# Patient Record
Sex: Female | Born: 1950
Health system: Southern US, Community
[De-identification: ages and names within clinical notes are randomized; demographics above are authoritative.]

## PROBLEM LIST (undated history)

## (undated) ENCOUNTER — Ambulatory Visit (HOSPITAL_COMMUNITY): Admission: EM | Payer: Medicare HMO

## (undated) DIAGNOSIS — I10 Essential (primary) hypertension: Secondary | ICD-10-CM

## (undated) DIAGNOSIS — F32A Depression, unspecified: Secondary | ICD-10-CM

## (undated) DIAGNOSIS — R569 Unspecified convulsions: Secondary | ICD-10-CM

## (undated) DIAGNOSIS — M797 Fibromyalgia: Secondary | ICD-10-CM

## (undated) DIAGNOSIS — E785 Hyperlipidemia, unspecified: Secondary | ICD-10-CM

## (undated) DIAGNOSIS — F419 Anxiety disorder, unspecified: Secondary | ICD-10-CM

## (undated) DIAGNOSIS — K219 Gastro-esophageal reflux disease without esophagitis: Secondary | ICD-10-CM

## (undated) DIAGNOSIS — F329 Major depressive disorder, single episode, unspecified: Secondary | ICD-10-CM

## (undated) DIAGNOSIS — H269 Unspecified cataract: Secondary | ICD-10-CM

## (undated) DIAGNOSIS — G4700 Insomnia, unspecified: Secondary | ICD-10-CM

## (undated) DIAGNOSIS — I729 Aneurysm of unspecified site: Secondary | ICD-10-CM

## (undated) DIAGNOSIS — M1990 Unspecified osteoarthritis, unspecified site: Secondary | ICD-10-CM

## (undated) HISTORY — DX: Depression, unspecified: F32.A

## (undated) HISTORY — DX: Unspecified cataract: H26.9

## (undated) HISTORY — DX: Anxiety disorder, unspecified: F41.9

## (undated) HISTORY — PX: POLYPECTOMY: SHX149

## (undated) HISTORY — PX: COLONOSCOPY: SHX174

## (undated) HISTORY — DX: Hyperlipidemia, unspecified: E78.5

## (undated) HISTORY — DX: Unspecified convulsions: R56.9

## (undated) HISTORY — DX: Gastro-esophageal reflux disease without esophagitis: K21.9

## (undated) HISTORY — PX: BREAST CYST EXCISION: SHX579

## (undated) HISTORY — DX: Aneurysm of unspecified site: I72.9

## (undated) HISTORY — DX: Unspecified osteoarthritis, unspecified site: M19.90

## (undated) HISTORY — PX: CEREBRAL ANEURYSM REPAIR: SHX164

## (undated) HISTORY — PX: ABDOMINAL HYSTERECTOMY: SHX81

## (undated) HISTORY — DX: Major depressive disorder, single episode, unspecified: F32.9

---

## 2009-12-01 ENCOUNTER — Emergency Department (HOSPITAL_COMMUNITY): Admission: EM | Admit: 2009-12-01 | Discharge: 2009-12-01 | Payer: Self-pay | Admitting: Emergency Medicine

## 2010-01-19 ENCOUNTER — Emergency Department (HOSPITAL_COMMUNITY)
Admission: EM | Admit: 2010-01-19 | Discharge: 2010-01-19 | Payer: Self-pay | Source: Home / Self Care | Admitting: Emergency Medicine

## 2010-03-02 ENCOUNTER — Emergency Department (HOSPITAL_COMMUNITY)
Admission: EM | Admit: 2010-03-02 | Discharge: 2010-03-02 | Disposition: A | Discharge status: Home / Self Care | Payer: Self-pay | Attending: Emergency Medicine | Admitting: Emergency Medicine

## 2010-03-02 DIAGNOSIS — B3731 Acute candidiasis of vulva and vagina: Secondary | ICD-10-CM | POA: Insufficient documentation

## 2010-03-02 DIAGNOSIS — I10 Essential (primary) hypertension: Secondary | ICD-10-CM | POA: Insufficient documentation

## 2010-03-02 DIAGNOSIS — R569 Unspecified convulsions: Secondary | ICD-10-CM | POA: Insufficient documentation

## 2010-03-02 DIAGNOSIS — B373 Candidiasis of vulva and vagina: Secondary | ICD-10-CM | POA: Insufficient documentation

## 2010-03-02 DIAGNOSIS — R300 Dysuria: Secondary | ICD-10-CM | POA: Insufficient documentation

## 2010-03-02 DIAGNOSIS — N390 Urinary tract infection, site not specified: Secondary | ICD-10-CM | POA: Insufficient documentation

## 2010-03-02 LAB — URINE MICROSCOPIC-ADD ON

## 2010-03-02 LAB — URINALYSIS, ROUTINE W REFLEX MICROSCOPIC
Specific Gravity, Urine: 1.008 (ref 1.005–1.030)
Urine Glucose, Fasting: NEGATIVE mg/dL
Urobilinogen, UA: 0.2 mg/dL (ref 0.0–1.0)

## 2010-03-04 LAB — URINE CULTURE

## 2010-03-29 ENCOUNTER — Emergency Department (HOSPITAL_COMMUNITY)
Admission: EM | Admit: 2010-03-29 | Discharge: 2010-03-29 | Disposition: A | Discharge status: Home / Self Care | Payer: Self-pay | Attending: Emergency Medicine | Admitting: Emergency Medicine

## 2010-03-29 DIAGNOSIS — R300 Dysuria: Secondary | ICD-10-CM | POA: Insufficient documentation

## 2010-03-29 DIAGNOSIS — B3731 Acute candidiasis of vulva and vagina: Secondary | ICD-10-CM | POA: Insufficient documentation

## 2010-03-29 DIAGNOSIS — G40909 Epilepsy, unspecified, not intractable, without status epilepticus: Secondary | ICD-10-CM | POA: Insufficient documentation

## 2010-03-29 DIAGNOSIS — L293 Anogenital pruritus, unspecified: Secondary | ICD-10-CM | POA: Insufficient documentation

## 2010-03-29 DIAGNOSIS — N390 Urinary tract infection, site not specified: Secondary | ICD-10-CM | POA: Insufficient documentation

## 2010-03-29 DIAGNOSIS — R350 Frequency of micturition: Secondary | ICD-10-CM | POA: Insufficient documentation

## 2010-03-29 DIAGNOSIS — Z79899 Other long term (current) drug therapy: Secondary | ICD-10-CM | POA: Insufficient documentation

## 2010-03-29 DIAGNOSIS — R21 Rash and other nonspecific skin eruption: Secondary | ICD-10-CM | POA: Insufficient documentation

## 2010-03-29 DIAGNOSIS — B373 Candidiasis of vulva and vagina: Secondary | ICD-10-CM | POA: Insufficient documentation

## 2010-03-29 DIAGNOSIS — I10 Essential (primary) hypertension: Secondary | ICD-10-CM | POA: Insufficient documentation

## 2010-03-29 LAB — URINALYSIS, ROUTINE W REFLEX MICROSCOPIC
Bilirubin Urine: NEGATIVE
Glucose, UA: NEGATIVE mg/dL
Ketones, ur: NEGATIVE mg/dL
Nitrite: POSITIVE — AB
Protein, ur: NEGATIVE mg/dL
Specific Gravity, Urine: 1.015 (ref 1.005–1.030)
Urobilinogen, UA: 0.2 mg/dL (ref 0.0–1.0)
pH: 5.5 (ref 5.0–8.0)

## 2010-03-29 LAB — WET PREP, GENITAL
Clue Cells Wet Prep HPF POC: NONE SEEN
Yeast Wet Prep HPF POC: NONE SEEN

## 2010-03-29 LAB — URINE MICROSCOPIC-ADD ON

## 2010-04-04 LAB — URINALYSIS, ROUTINE W REFLEX MICROSCOPIC
Bilirubin Urine: NEGATIVE
Glucose, UA: NEGATIVE mg/dL
Ketones, ur: NEGATIVE mg/dL
Nitrite: NEGATIVE
Specific Gravity, Urine: 1.009 (ref 1.005–1.030)
Urobilinogen, UA: 0.2 mg/dL (ref 0.0–1.0)
pH: 5 (ref 5.0–8.0)

## 2010-04-04 LAB — POCT I-STAT, CHEM 8
Calcium, Ion: 1.17 mmol/L (ref 1.12–1.32)
Glucose, Bld: 88 mg/dL (ref 70–99)
HCT: 45 % (ref 36.0–46.0)
Hemoglobin: 15.3 g/dL — ABNORMAL HIGH (ref 12.0–15.0)
Sodium: 134 mEq/L — ABNORMAL LOW (ref 135–145)
TCO2: 27 mmol/L (ref 0–100)

## 2010-04-04 LAB — URINE CULTURE

## 2010-04-05 LAB — URINALYSIS, ROUTINE W REFLEX MICROSCOPIC
Bilirubin Urine: NEGATIVE
Glucose, UA: NEGATIVE mg/dL
Ketones, ur: NEGATIVE mg/dL
Nitrite: POSITIVE — AB
Protein, ur: NEGATIVE mg/dL
Specific Gravity, Urine: 1.007 (ref 1.005–1.030)
Urobilinogen, UA: 0.2 mg/dL (ref 0.0–1.0)
pH: 6 (ref 5.0–8.0)

## 2010-04-05 LAB — URINE CULTURE: Culture  Setup Time: 201111091628

## 2010-04-05 LAB — URINE MICROSCOPIC-ADD ON

## 2010-04-21 ENCOUNTER — Emergency Department (HOSPITAL_COMMUNITY)
Admission: EM | Admit: 2010-04-21 | Discharge: 2010-04-21 | Disposition: A | Discharge status: Home / Self Care | Payer: Self-pay | Attending: Emergency Medicine | Admitting: Emergency Medicine

## 2010-04-21 DIAGNOSIS — N899 Noninflammatory disorder of vagina, unspecified: Secondary | ICD-10-CM | POA: Insufficient documentation

## 2010-04-21 DIAGNOSIS — I10 Essential (primary) hypertension: Secondary | ICD-10-CM | POA: Insufficient documentation

## 2010-04-21 DIAGNOSIS — N760 Acute vaginitis: Secondary | ICD-10-CM | POA: Insufficient documentation

## 2010-04-21 DIAGNOSIS — Z79899 Other long term (current) drug therapy: Secondary | ICD-10-CM | POA: Insufficient documentation

## 2010-04-21 DIAGNOSIS — G40909 Epilepsy, unspecified, not intractable, without status epilepticus: Secondary | ICD-10-CM | POA: Insufficient documentation

## 2010-04-21 DIAGNOSIS — R300 Dysuria: Secondary | ICD-10-CM | POA: Insufficient documentation

## 2010-04-21 LAB — URINALYSIS, ROUTINE W REFLEX MICROSCOPIC
Bilirubin Urine: NEGATIVE
Glucose, UA: NEGATIVE mg/dL
Hgb urine dipstick: NEGATIVE
Protein, ur: NEGATIVE mg/dL
Urobilinogen, UA: 0.2 mg/dL (ref 0.0–1.0)
pH: 6.5 (ref 5.0–8.0)

## 2010-06-02 ENCOUNTER — Encounter: Payer: Self-pay | Admitting: Family Medicine

## 2010-06-02 ENCOUNTER — Ambulatory Visit (INDEPENDENT_AMBULATORY_CARE_PROVIDER_SITE_OTHER): Payer: Self-pay | Admitting: Family Medicine

## 2010-06-02 DIAGNOSIS — M1990 Unspecified osteoarthritis, unspecified site: Secondary | ICD-10-CM

## 2010-06-02 DIAGNOSIS — R202 Paresthesia of skin: Secondary | ICD-10-CM

## 2010-06-02 DIAGNOSIS — N390 Urinary tract infection, site not specified: Secondary | ICD-10-CM

## 2010-06-02 DIAGNOSIS — R569 Unspecified convulsions: Secondary | ICD-10-CM

## 2010-06-02 DIAGNOSIS — F329 Major depressive disorder, single episode, unspecified: Secondary | ICD-10-CM

## 2010-06-02 DIAGNOSIS — G4700 Insomnia, unspecified: Secondary | ICD-10-CM

## 2010-06-02 DIAGNOSIS — R209 Unspecified disturbances of skin sensation: Secondary | ICD-10-CM

## 2010-06-02 DIAGNOSIS — G5620 Lesion of ulnar nerve, unspecified upper limb: Secondary | ICD-10-CM

## 2010-06-02 DIAGNOSIS — M129 Arthropathy, unspecified: Secondary | ICD-10-CM

## 2010-06-02 MED ORDER — TRAMADOL HCL 50 MG PO TABS: 50.0000 mg | ORAL_TABLET | Freq: Four times a day (QID) | ORAL | Status: DC | PRN

## 2010-06-02 MED ORDER — CITALOPRAM HYDROBROMIDE 40 MG PO TABS: 40.0000 mg | ORAL_TABLET | Freq: Every day | ORAL | Status: DC

## 2010-06-02 MED ORDER — LEVETIRACETAM 500 MG PO TABS: 500.0000 mg | ORAL_TABLET | Freq: Two times a day (BID) | ORAL | Status: DC

## 2010-06-02 MED ORDER — TEMAZEPAM 22.5 MG PO CAPS: 22.5000 mg | ORAL_CAPSULE | Freq: Every evening | ORAL | Status: DC | PRN

## 2010-06-02 MED ORDER — QUINAPRIL HCL 20 MG PO TABS: 20.0000 mg | ORAL_TABLET | Freq: Every day | ORAL | Status: DC

## 2010-06-02 MED ORDER — ESTROGENS CONJUGATED 0.625 MG PO TABS: 0.6250 mg | ORAL_TABLET | Freq: Every day | ORAL | Status: DC

## 2010-06-02 MED ORDER — GABAPENTIN 100 MG PO TABS: 100.0000 mg | ORAL_TABLET | Freq: Three times a day (TID) | ORAL | Status: DC

## 2010-06-02 NOTE — Patient Instructions (Signed)
Please stop taking indomethacin Please make an appt for lab work in the AM Come without eating or drinking except for black coffee or water Please come back to see me in 2-3 weeks to discuss labs and do a physical.  Please read the seratonin syndrome handout for tramadol and celexa  Call Dr. Pascal Lux to make an appt for [phone] for the mood disorder clinic.  I will let her know to expect your call.

## 2010-06-03 ENCOUNTER — Encounter: Payer: Self-pay | Admitting: Family Medicine

## 2010-06-03 DIAGNOSIS — G5620 Lesion of ulnar nerve, unspecified upper limb: Secondary | ICD-10-CM | POA: Insufficient documentation

## 2010-06-03 DIAGNOSIS — F329 Major depressive disorder, single episode, unspecified: Secondary | ICD-10-CM | POA: Insufficient documentation

## 2010-06-03 DIAGNOSIS — R202 Paresthesia of skin: Secondary | ICD-10-CM | POA: Insufficient documentation

## 2010-06-03 DIAGNOSIS — R569 Unspecified convulsions: Secondary | ICD-10-CM | POA: Insufficient documentation

## 2010-06-03 DIAGNOSIS — G4700 Insomnia, unspecified: Secondary | ICD-10-CM | POA: Insufficient documentation

## 2010-06-03 DIAGNOSIS — N390 Urinary tract infection, site not specified: Secondary | ICD-10-CM | POA: Insufficient documentation

## 2010-06-03 DIAGNOSIS — M1990 Unspecified osteoarthritis, unspecified site: Secondary | ICD-10-CM | POA: Insufficient documentation

## 2010-06-03 NOTE — Assessment & Plan Note (Signed)
Pinkie and 2nd finger numbness.  No weakness.  Conservative management.  Advised brace but on further reading brace no better than placebo.  If brace not helping, will advise to stop.

## 2010-06-03 NOTE — Assessment & Plan Note (Signed)
On temazapam for this x 2 years.  Will continue for now.  Will try to wean pt from this medication

## 2010-06-03 NOTE — Progress Notes (Signed)
  Subjective:    Patient ID: [name], female    DOB: May 21, 1950, 60 y.o.   MRN: [id number]  HPI  Depression- x several years.  Has been on several meds.  Also dealing with grief issues.  Open to trying new medication and or therapy.  No SI/HI.    Seizures- has had 6 seizures over the last 6 years.  States that these started when her anerysm was clipped.  Has been on keppra x 2 years. Saw a neurologist at time of seizures but not since  Numbness in fingers- 4th and 5th fingers with numbness x 2 weeks.  Feels that her fingers are occasionally weak.  Denies tingling or shooting pains.    Arthritis- pain in joints, especially in spine.  Has been trying indomethacin with limited relief. Has not tried tylenol consistently, just intermittently but doesn't think it helps.  Thinks tramadol may have helped in the past   Requests to defer CPE until next visit. Review of Systems    deneis CP, SOB, seizures Objective:   Physical Exam    Vital signs reviewed General appearance - alert, well appearing, and in no distress and oriented to person, place, and time Heart - normal rate, regular rhythm, normal S1, S2, no murmurs, rubs, clicks or gallops Chest - clear to auscultation, no wheezes, rales or rhonchi, symmetric air entry, no tachypnea, retractions or cyanosis Neurological - alert, oriented, normal speech, no focal findings or movement disorder noted, screening mental status exam normal, neck supple without rigidity, cranial nerves II through XII intact.  Strength normal in hands, upper and lower ext.  Numbness brought on with phalen's, negative tinnel's at wrist.  No findings at elbow.       Assessment & Plan:

## 2010-06-03 NOTE — Assessment & Plan Note (Signed)
Will stop indomethacin due to HTN.  Will start tramadol for pain.

## 2010-06-03 NOTE — Assessment & Plan Note (Signed)
Will advise to call Dr. Pascal Lux to see if [facility]DC would be appropriate.  celexa is not helping, per pt.  However denies SI/HI.  Also pt states that she has lots of situational stress (son with esophageal CA and brother died in 2023/01/09) and does nto have a therapist. Would be interested in couseling.

## 2010-06-03 NOTE — Assessment & Plan Note (Signed)
No symptoms now.  Normal urology eval per pt.

## 2010-06-07 ENCOUNTER — Other Ambulatory Visit: Payer: Self-pay

## 2010-06-07 DIAGNOSIS — D509 Iron deficiency anemia, unspecified: Secondary | ICD-10-CM

## 2010-06-07 DIAGNOSIS — I10 Essential (primary) hypertension: Secondary | ICD-10-CM

## 2010-06-07 LAB — COMPREHENSIVE METABOLIC PANEL
ALT: 15 U/L (ref 0–35)
Albumin: 4 g/dL (ref 3.5–5.2)
Alkaline Phosphatase: 65 U/L (ref 39–117)
Calcium: 8.9 mg/dL (ref 8.4–10.5)
Creat: 0.77 mg/dL (ref 0.40–1.20)
Glucose, Bld: 78 mg/dL (ref 70–99)
Potassium: 4.5 mEq/L (ref 3.5–5.3)
Sodium: 134 mEq/L — ABNORMAL LOW (ref 135–145)
Total Bilirubin: 0.3 mg/dL (ref 0.3–1.2)

## 2010-06-07 LAB — LIPID PANEL
Cholesterol: 217 mg/dL — ABNORMAL HIGH (ref 0–200)
HDL: 59 mg/dL (ref >39)
Triglycerides: 305 mg/dL — ABNORMAL HIGH (ref <150)
VLDL: 61 mg/dL — ABNORMAL HIGH (ref 0–40)

## 2010-06-07 LAB — CBC
HCT: 40.1 % (ref 36.0–46.0)
Hemoglobin: 13.4 g/dL (ref 12.0–15.0)
MCV: 93.5 fL (ref 78.0–100.0)
RDW: 13.8 % (ref 11.5–15.5)

## 2010-06-07 NOTE — Progress Notes (Signed)
Cmp,cbc,tsh and flp done today [facility] Kemani Heidel

## 2010-06-09 ENCOUNTER — Encounter: Payer: Self-pay | Admitting: Family Medicine

## 2010-06-13 ENCOUNTER — Encounter: Payer: Self-pay | Admitting: Family Medicine

## 2010-06-18 ENCOUNTER — Emergency Department (HOSPITAL_COMMUNITY)
Admission: EM | Admit: 2010-06-18 | Discharge: 2010-06-18 | Disposition: A | Discharge status: Home / Self Care | Payer: Self-pay | Attending: Emergency Medicine | Admitting: Emergency Medicine

## 2010-06-18 DIAGNOSIS — F411 Generalized anxiety disorder: Secondary | ICD-10-CM | POA: Insufficient documentation

## 2010-06-18 DIAGNOSIS — G40909 Epilepsy, unspecified, not intractable, without status epilepticus: Secondary | ICD-10-CM | POA: Insufficient documentation

## 2010-06-18 DIAGNOSIS — Z79899 Other long term (current) drug therapy: Secondary | ICD-10-CM | POA: Insufficient documentation

## 2010-06-18 DIAGNOSIS — Z760 Encounter for issue of repeat prescription: Secondary | ICD-10-CM | POA: Insufficient documentation

## 2010-06-18 DIAGNOSIS — I10 Essential (primary) hypertension: Secondary | ICD-10-CM | POA: Insufficient documentation

## 2010-06-22 ENCOUNTER — Ambulatory Visit: Payer: Self-pay | Admitting: Family Medicine

## 2010-07-01 DIAGNOSIS — I671 Cerebral aneurysm, nonruptured: Secondary | ICD-10-CM | POA: Insufficient documentation

## 2010-08-08 ENCOUNTER — Other Ambulatory Visit: Payer: Self-pay | Admitting: Family Medicine

## 2010-08-09 NOTE — Telephone Encounter (Signed)
Refill request

## 2010-09-01 DIAGNOSIS — F419 Anxiety disorder, unspecified: Secondary | ICD-10-CM | POA: Insufficient documentation

## 2010-09-14 ENCOUNTER — Emergency Department (HOSPITAL_COMMUNITY)
Admission: EM | Admit: 2010-09-14 | Discharge: 2010-09-14 | Disposition: A | Discharge status: Home / Self Care | Payer: Self-pay | Attending: Emergency Medicine | Admitting: Emergency Medicine

## 2010-09-14 DIAGNOSIS — H53149 Visual discomfort, unspecified: Secondary | ICD-10-CM | POA: Insufficient documentation

## 2010-09-14 DIAGNOSIS — I10 Essential (primary) hypertension: Secondary | ICD-10-CM | POA: Insufficient documentation

## 2010-09-14 DIAGNOSIS — Z79899 Other long term (current) drug therapy: Secondary | ICD-10-CM | POA: Insufficient documentation

## 2010-09-14 DIAGNOSIS — G40909 Epilepsy, unspecified, not intractable, without status epilepticus: Secondary | ICD-10-CM | POA: Insufficient documentation

## 2010-09-14 DIAGNOSIS — G43909 Migraine, unspecified, not intractable, without status migrainosus: Secondary | ICD-10-CM | POA: Insufficient documentation

## 2010-09-14 DIAGNOSIS — R110 Nausea: Secondary | ICD-10-CM | POA: Insufficient documentation

## 2010-10-05 ENCOUNTER — Other Ambulatory Visit: Payer: Self-pay | Admitting: Family Medicine

## 2010-10-05 NOTE — Telephone Encounter (Signed)
Refill request

## 2010-10-12 ENCOUNTER — Inpatient Hospital Stay (HOSPITAL_COMMUNITY)
Admission: RE | Admit: 2010-10-12 | Discharge: 2010-11-02 | DRG: 885 | Disposition: A | Discharge status: Home / Self Care | Payer: PRIVATE HEALTH INSURANCE | Attending: Psychiatry | Admitting: Psychiatry

## 2010-10-12 DIAGNOSIS — Z79899 Other long term (current) drug therapy: Secondary | ICD-10-CM

## 2010-10-12 DIAGNOSIS — M129 Arthropathy, unspecified: Secondary | ICD-10-CM

## 2010-10-12 DIAGNOSIS — R45851 Suicidal ideations: Secondary | ICD-10-CM

## 2010-10-12 DIAGNOSIS — IMO0001 Reserved for inherently not codable concepts without codable children: Secondary | ICD-10-CM

## 2010-10-12 DIAGNOSIS — F332 Major depressive disorder, recurrent severe without psychotic features: Principal | ICD-10-CM

## 2010-10-12 DIAGNOSIS — N390 Urinary tract infection, site not specified: Secondary | ICD-10-CM

## 2010-10-12 DIAGNOSIS — G43909 Migraine, unspecified, not intractable, without status migrainosus: Secondary | ICD-10-CM

## 2010-10-12 DIAGNOSIS — G40909 Epilepsy, unspecified, not intractable, without status epilepticus: Secondary | ICD-10-CM

## 2010-10-12 DIAGNOSIS — F411 Generalized anxiety disorder: Secondary | ICD-10-CM

## 2010-10-12 DIAGNOSIS — I10 Essential (primary) hypertension: Secondary | ICD-10-CM

## 2010-10-13 DIAGNOSIS — F339 Major depressive disorder, recurrent, unspecified: Secondary | ICD-10-CM

## 2010-10-13 DIAGNOSIS — F411 Generalized anxiety disorder: Secondary | ICD-10-CM

## 2010-10-13 LAB — HEPATIC FUNCTION PANEL
ALT: 23 U/L (ref 0–35)
AST: 23 U/L (ref 0–37)
Bilirubin, Direct: 0.1 mg/dL (ref 0.0–0.3)
Indirect Bilirubin: 0.2 mg/dL — ABNORMAL LOW (ref 0.3–0.9)
Total Bilirubin: 0.3 mg/dL (ref 0.3–1.2)

## 2010-10-13 LAB — ETHANOL: Alcohol, Ethyl (B): <11 mg/dL (ref 0–11)

## 2010-10-13 LAB — CBC
HCT: 41.6 % (ref 36.0–46.0)
Hemoglobin: 14.2 g/dL (ref 12.0–15.0)
RBC: 4.5 MIL/uL (ref 3.87–5.11)
WBC: 11.1 10*3/uL — ABNORMAL HIGH (ref 4.0–10.5)

## 2010-10-13 LAB — TSH: TSH: 1.552 u[IU]/mL (ref 0.350–4.500)

## 2010-10-13 LAB — HEMOGLOBIN A1C
Hgb A1c MFr Bld: 5.4 % (ref <5.7)
Mean Plasma Glucose: 108 mg/dL (ref <117)

## 2010-10-13 LAB — COMPREHENSIVE METABOLIC PANEL
Albumin: 3.7 g/dL (ref 3.5–5.2)
BUN: 20 mg/dL (ref 6–23)
Creatinine, Ser: 0.79 mg/dL (ref 0.50–1.10)
Total Protein: 7.1 g/dL (ref 6.0–8.3)

## 2010-10-14 LAB — TSH: TSH: 0.997 u[IU]/mL (ref 0.350–4.500)

## 2010-10-14 LAB — T3, FREE: T3, Free: 2.5 pg/mL (ref 2.3–4.2)

## 2010-10-17 LAB — URINE MICROSCOPIC-ADD ON

## 2010-10-17 LAB — URINALYSIS, ROUTINE W REFLEX MICROSCOPIC
Bilirubin Urine: NEGATIVE
Glucose, UA: NEGATIVE mg/dL
Ketones, ur: NEGATIVE mg/dL
Protein, ur: NEGATIVE mg/dL

## 2010-10-18 LAB — URINE DRUGS OF ABUSE SCREEN W ALC, ROUTINE (REF LAB)
Amphetamine Screen, Ur: NEGATIVE
Barbiturate Quant, Ur: NEGATIVE
Cocaine Metabolites: NEGATIVE
Creatinine,U: 28.3 mg/dL
Marijuana Metabolite: NEGATIVE
Methadone: NEGATIVE

## 2010-10-24 DIAGNOSIS — F411 Generalized anxiety disorder: Secondary | ICD-10-CM

## 2010-10-24 DIAGNOSIS — F339 Major depressive disorder, recurrent, unspecified: Secondary | ICD-10-CM

## 2010-10-24 NOTE — Assessment & Plan Note (Signed)
[name], [name] NO.:  [id number]  MEDICAL RECORD NO.:  [id number]  LOCATION:  0502                          FACILITY:  [facility]  PHYSICIAN:  Franchot Gallo, MD     DATE OF BIRTH:  [birth year]  DATE OF ADMISSION:  10/12/2010 DATE OF DISCHARGE:                      PSYCHIATRIC ADMISSION ASSESSMENT   This is a 60 year old female voluntarily admitted on October 12, 2010.  HISTORY OF PRESENT ILLNESS:  The patient presents with a history of depression, having a lot of grief issues.  She had lost her only son in May from esophageal cancer.  She has been isolating, having difficulty sleeping.  She is constantly thinking about him.  She denies any substance use or psychotic symptoms.  PAST PSYCHIATRIC HISTORY:  First admission to [facility]. She sees Dr. Lafayette Williamson for outpatient mental health services.  The patient reports a long history of depression.  SOCIAL HISTORY:  The patient is single.  She resides in [place].  She lives with a friend and is divorced.  FAMILY HISTORY:  None.  ALCOHOL AND DRUG HISTORY:  She denies any alcohol or substance use.  PRIMARY CARE PROVIDER:  Dr. Maryelizabeth Williamson.  MEDICAL PROBLEMS: 1. History of a seizure disorder. 2. Hypertension. 3. Fibromyalgia.  PAST SURGICAL HISTORY: 1. Hysterectomy. 2. Cerebral aneurysm in 1997.  MEDICATIONS: 1. Keppra 500 mg b.i.d. 2. Metoprolol 25 mg daily. 3. Quinapril 20 mg daily. 4. Premarin 6.25 mg daily. 5. Prozac 20 mg daily. 6. Temazepam 30 mg at bedtime.  DRUG ALLERGIES:  No known allergies, but the patient gets headaches from Wellbutrin.  REVIEW OF SYSTEMS:  Is positive for insomnia.  No weight loss.  No chest pain.  No chest pressure.  No shortness of breath.  No nausea.  No vomiting.  No dysuria, constipation.  Positive for muscle and joint pain.  Positive for seizures.  Positive for depression.  Positive for anxiety.  PHYSICAL EXAMINATION:  VITAL SIGNS:   Temperature of 97, heart rate 88, respirations 16, blood pressure is 128/82. GENERAL APPEARANCE:  This is a well-nourished female in no acute distress. HEAD, NOSE AND THROAT:  Atraumatic and normocephalic. NECK:  Supple. CHEST:  Clear.  No rales.  No wheezes. BREAST EXAM:  Deferred. HEART:  Regular rate and rhythm.  No murmurs or gallops. ABDOMEN:  Soft, nondistended abdomen.  Positive bowel sounds. PELVIC AND GU EXAM:  Deferred. EXTREMITIES:  Moves all extremities.  No clubbing.  No deformities. SKIN:  Warm and dry with no obvious rashes or lacerations noted. NEUROLOGICAL FINDINGS:  Intact.  Nonfocal.  No tics.  No tremors.  LABORATORY DATA:  A CBC with a white count of 11.1.  CMP within normal limits.  Alcohol level less than 11.  MENTAL STATUS EXAM:  The patient is fully alert and cooperative, very calm and neat in appearance.  She has good eye contact.  She provides a good history.  Her mood is depressed.  She appears sad.  Thought processes are coherent and goal-directed.  No evidence of any psychotic symptoms and does not appear to be responding to any internal stimuli. Cognitive function intact.  Her memory is good.  Her judgment and insight appear  to be good.  Axis I:  Major depressive disorder, severe, recurrent; bereavement. Axis II:  Deferred. Axis III:  History of hypertension, seizure disorder and a history of cerebral aneurysm. Axis IV:  Other psychosocial problems related to grief. Axis V:  Current is 35-40.  PLAN:  Discontinue her Prozac.  We will initiate Cymbalta to help with her pain and depression.  We will obtain further labs.  We will have Neurontin as well for pain.  Her tentative length of stay at this time is 3-5 days.     [name], N.P.   ______________________________ Franchot Gallo, MD    JO/MEDQ  D:  10/13/2010  T:  10/13/2010  Job:  [id number]  Electronically Signed by Limmie PatriciaP. on 10/24/2010 09:54:42 AM Electronically Signed  by Franchot Gallo MD on 10/24/2010 12:21:34 PM

## 2010-11-04 NOTE — Discharge Summary (Signed)
[name], [name]                 ACCOUNT NO.:  [id number]  MEDICAL RECORD NO.:  [id number]  LOCATION:  0502                          [facility]ILITY[facility]  [facility]  PHYSICIAN:  Franchot Gallo, MD     DATE OF BIRTH:  [birth year]  DATE OF ADMISSION:  10/12/2010 DATE OF DISCHARGE:  11/02/2010                              DISCHARGE SUMMARY   REASON FOR ADMISSION:  This is a 60 year old female who has a long history of depression and had a lot of grief issues, lost her only son in May from a history of esophageal cancer.  She has been isolating, having trouble sleeping and constantly thinking about him.  FINAL IMPRESSION:  AXIS I:  Major depressive disorder, recurrent, severe.  Generalized anxiety disorder. AXIS II:  Deferred. AXIS III:  Seizure disorder, hypertension and fibromyalgia. AXIS IV:  Limited primary support system, chronic mental illness. AXIS V:  Global assessment of functioning at discharge is 65.  LABS:  CBC:  White count 11.1.  CMP within normal limits.  Alcohol level less than 11.  SIGNIFICANT FINDINGS:  The patient was admitted to the adult milieu for safety and stabilization.  We discontinued her Prozac at this time and started Cymbalta to help with her pain and depression.  We also ordered Neurontin for pain.  The patient was having ongoing problems with suicidal thinking throughout her stay.  Unable to still process her grief issues.  She was very active, however and attending groups, very organized, clear thinking.  [name] refused any contact with her family or significant others reporting that the patient's friend was her only support, but did not want her friend involved in her treatment.  She was having problems with trichotillomania and was started on medication.  She remained very depressed.  She had no plan to harm herself.  We increased her Cymbalta and her Neurontin to help with her anxiety and depression, and continued to monitor her behavior.  She rated her  hopelessness a 5 on a scale of 1-10.  She continued to report problems with sleep, but denied any suicidal or homicidal thoughts.  We added Klonopin for anxiety and started her on Macrodantin for history of urinary tract infection.  We also added Abilify at this time to augment her antidepressant.  We continued to titrate her Abilify and increased her Klonopin for anxiety.  We discontinued her Risperdal due to sedation and added Zoloft to augment her antidepressant.  We started the patient on Orap for trichotillomania and discontinued her Abilify.  There are times when the patient's depression would worsen and her hopelessness had increased rating it a 9 on a scale of 1-10.  She was still pulling her hair when she would feel especially depressed when groups had talked about grief and discussed her children.  She was having thoughts at times that she would never get well.  We increased her Orap and increased her Zoloft to further lessen her depressive symptoms.  We also ordered an EKG to monitor her QT interval.  She was beginning to improve, up and dressed, and active in the milieu, interacting with other patients.  She did report at one time on October 29, 2010, that she did not think she needed to be here and felt she was getting ready to go home.  She was getting some benefit with the Orap with her hair pulling.  On the day of discharge, the patient was seen in the Interdisciplinary Treatment Team.  She was fully alert, cooperative, felt she was ready to go home, more bright.  She had a plan and numbers to call in case of crisis.  She states that she would touch base with her friend for any issues.  Her sleep was fair to good.  Appetite was good.  Her mild depressive symptoms, rating it a 4-5 on a scale of 1-10. Adamantly denied any suicidal or homicidal thoughts, or psychotic symptoms.  DISCHARGE MEDICATIONS: 1. Klonopin 1 mg at 8 and 2. 2. Premarin 6.25 mg daily. 3. Colace 100 mg  daily. 4. Cymbalta 60 mg one at 8 and 2. 5. Lunesta 3 mg at 8:30. 6. Gabapentin 800 mg at 8, 2 and 8:30. 7. Orap 2 mg taking 1-1/2 daily and 1 tablet at 8:30 p.m. 8. Zoloft 100 mg daily. 9. Keppra 500 mg one b.i.d. 10.Metoprolol 25 mg daily. 11.Quinapril 20 mg daily. 12.The patient was to stop taking her Restoril.  FOLLOW UP APPOINTMENT: 1. [facility] with the walk-in clinic, phone number [phone].  She also     received information on resources. 2. She would also have a [facility] Community Support Team at phone number     [phone] and received information on hospice, phone number [phone].     Landry Corporal, N.P.   ______________________________ Franchot Gallo, MD    JO/MEDQ  D:  11/03/2010  T:  11/04/2010  Job:  [id number]  Electronically Signed by Limmie PatriciaP. on 11/04/2010 09:39:08 AM Electronically Signed by Franchot Gallo MD on 11/04/2010 04:59:31 PM

## 2010-11-24 ENCOUNTER — Emergency Department (HOSPITAL_COMMUNITY): Payer: Self-pay

## 2010-11-24 ENCOUNTER — Observation Stay (HOSPITAL_COMMUNITY)
Admission: EM | Admit: 2010-11-24 | Discharge: 2010-11-28 | Disposition: A | Discharge status: Home / Self Care | Payer: Self-pay | Attending: Family Medicine | Admitting: Family Medicine

## 2010-11-24 DIAGNOSIS — Z23 Encounter for immunization: Secondary | ICD-10-CM | POA: Insufficient documentation

## 2010-11-24 DIAGNOSIS — R269 Unspecified abnormalities of gait and mobility: Principal | ICD-10-CM | POA: Insufficient documentation

## 2010-11-24 DIAGNOSIS — F329 Major depressive disorder, single episode, unspecified: Secondary | ICD-10-CM | POA: Insufficient documentation

## 2010-11-24 DIAGNOSIS — Z79899 Other long term (current) drug therapy: Secondary | ICD-10-CM | POA: Insufficient documentation

## 2010-11-24 DIAGNOSIS — R7402 Elevation of levels of lactic acid dehydrogenase (LDH): Secondary | ICD-10-CM | POA: Insufficient documentation

## 2010-11-24 DIAGNOSIS — E871 Hypo-osmolality and hyponatremia: Secondary | ICD-10-CM | POA: Insufficient documentation

## 2010-11-24 DIAGNOSIS — I10 Essential (primary) hypertension: Secondary | ICD-10-CM | POA: Insufficient documentation

## 2010-11-24 DIAGNOSIS — Z9181 History of falling: Secondary | ICD-10-CM | POA: Insufficient documentation

## 2010-11-24 DIAGNOSIS — R7401 Elevation of levels of liver transaminase levels: Secondary | ICD-10-CM | POA: Insufficient documentation

## 2010-11-24 DIAGNOSIS — R2681 Unsteadiness on feet: Secondary | ICD-10-CM | POA: Diagnosis present

## 2010-11-24 DIAGNOSIS — F3289 Other specified depressive episodes: Secondary | ICD-10-CM | POA: Insufficient documentation

## 2010-11-24 LAB — NA AND K (SODIUM & POTASSIUM), RAND UR: Potassium Urine: 25 mEq/L

## 2010-11-24 LAB — DIFFERENTIAL
Basophils Relative: 1 % (ref 0–1)
Eosinophils Absolute: 0.2 10*3/uL (ref 0.0–0.7)
Lymphs Abs: 2.2 10*3/uL (ref 0.7–4.0)
Neutro Abs: 4.7 10*3/uL (ref 1.7–7.7)
Neutrophils Relative %: 60 % (ref 43–77)

## 2010-11-24 LAB — OSMOLALITY: Osmolality: 264 mOsm/kg — ABNORMAL LOW (ref 275–300)

## 2010-11-24 LAB — CBC
MCV: 92.3 fL (ref 78.0–100.0)
Platelets: 309 10*3/uL (ref 150–400)
RBC: 4.05 MIL/uL (ref 3.87–5.11)
WBC: 7.8 10*3/uL (ref 4.0–10.5)

## 2010-11-24 LAB — CREATININE, URINE, RANDOM: Creatinine, Urine: 30.72 mg/dL

## 2010-11-24 LAB — COMPREHENSIVE METABOLIC PANEL
ALT: 62 U/L — ABNORMAL HIGH (ref 0–35)
AST: 106 U/L — ABNORMAL HIGH (ref 0–37)
CO2: 25 mEq/L (ref 19–32)
Chloride: 90 mEq/L — ABNORMAL LOW (ref 96–112)
GFR calc non Af Amer: >90 mL/min (ref >90)
Sodium: 128 mEq/L — ABNORMAL LOW (ref 135–145)
Total Bilirubin: 0.2 mg/dL — ABNORMAL LOW (ref 0.3–1.2)

## 2010-11-24 LAB — URINALYSIS, ROUTINE W REFLEX MICROSCOPIC
Bilirubin Urine: NEGATIVE
Hgb urine dipstick: NEGATIVE
Nitrite: NEGATIVE
Specific Gravity, Urine: 1.006 (ref 1.005–1.030)
pH: 7 (ref 5.0–8.0)

## 2010-11-25 DIAGNOSIS — F331 Major depressive disorder, recurrent, moderate: Secondary | ICD-10-CM

## 2010-11-25 LAB — CBC
Hemoglobin: 12.8 g/dL (ref 12.0–15.0)
MCH: 31.5 pg (ref 26.0–34.0)
MCV: 92.4 fL (ref 78.0–100.0)
RBC: 4.06 MIL/uL (ref 3.87–5.11)

## 2010-11-25 LAB — BASIC METABOLIC PANEL
CO2: 25 mEq/L (ref 19–32)
Calcium: 8.5 mg/dL (ref 8.4–10.5)
Chloride: 99 mEq/L (ref 96–112)
Glucose, Bld: 87 mg/dL (ref 70–99)
Sodium: 133 mEq/L — ABNORMAL LOW (ref 135–145)

## 2010-11-25 LAB — CK TOTAL AND CKMB (NOT AT ARMC): Relative Index: 0.9 (ref 0.0–2.5)

## 2010-11-26 DIAGNOSIS — F6389 Other impulse disorders: Secondary | ICD-10-CM

## 2010-11-26 DIAGNOSIS — F331 Major depressive disorder, recurrent, moderate: Secondary | ICD-10-CM

## 2010-11-26 DIAGNOSIS — F411 Generalized anxiety disorder: Secondary | ICD-10-CM

## 2010-11-26 LAB — COMPREHENSIVE METABOLIC PANEL
AST: 77 U/L — ABNORMAL HIGH (ref 0–37)
BUN: 8 mg/dL (ref 6–23)
CO2: 24 mEq/L (ref 19–32)
Calcium: 8.8 mg/dL (ref 8.4–10.5)
Chloride: 99 mEq/L (ref 96–112)
Creatinine, Ser: 0.59 mg/dL (ref 0.50–1.10)
GFR calc Af Amer: >90 mL/min (ref >90)
GFR calc non Af Amer: >90 mL/min (ref >90)
Glucose, Bld: 90 mg/dL (ref 70–99)
Total Bilirubin: 0.3 mg/dL (ref 0.3–1.2)

## 2010-11-26 LAB — CBC
Hemoglobin: 13.2 g/dL (ref 12.0–15.0)
MCH: 31.8 pg (ref 26.0–34.0)
MCHC: 34.6 g/dL (ref 30.0–36.0)
MCV: 92 fL (ref 78.0–100.0)

## 2010-11-26 LAB — CK: Total CK: 1202 U/L — ABNORMAL HIGH (ref 7–177)

## 2010-11-26 MED ORDER — DOCUSATE SODIUM 100 MG PO CAPS
100.0000 mg | ORAL_CAPSULE | Freq: Every day | ORAL | Status: DC | PRN
  Filled 2010-11-26: qty 1

## 2010-11-26 MED ORDER — LEVETIRACETAM 500 MG PO TABS
500.0000 mg | ORAL_TABLET | Freq: Two times a day (BID) | ORAL | Status: DC
  Administered 2010-11-26 – 2010-11-28 (×4): 500 mg via ORAL
  Filled 2010-11-26 (×6): qty 1

## 2010-11-26 MED ORDER — ONDANSETRON HCL 4 MG PO TABS: 4.0000 mg | ORAL_TABLET | Freq: Four times a day (QID) | ORAL | Status: DC | PRN

## 2010-11-26 MED ORDER — BISACODYL 10 MG RE SUPP: 10.0000 mg | Freq: Every day | RECTAL | Status: DC | PRN

## 2010-11-26 MED ORDER — RISPERIDONE 0.5 MG PO TABS
0.5000 mg | ORAL_TABLET | Freq: Every day | ORAL | Status: DC
  Administered 2010-11-26 – 2010-11-27 (×2): 0.5 mg via ORAL
  Filled 2010-11-26 (×3): qty 1

## 2010-11-26 MED ORDER — FLEET ENEMA 7-19 GM/118ML RE ENEM
1.0000 | ENEMA | Freq: Every day | RECTAL | Status: DC | PRN
  Filled 2010-11-26: qty 1

## 2010-11-26 MED ORDER — CLONAZEPAM 1 MG PO TABS
1.0000 mg | ORAL_TABLET | Freq: Two times a day (BID) | ORAL | Status: DC
  Administered 2010-11-26 – 2010-11-28 (×4): 1 mg via ORAL

## 2010-11-26 MED ORDER — ACETAMINOPHEN 325 MG PO TABS
650.0000 mg | ORAL_TABLET | ORAL | Status: DC | PRN
  Administered 2010-11-27 – 2010-11-28 (×3): 650 mg via ORAL
  Filled 2010-11-26: qty 2

## 2010-11-26 MED ORDER — ENOXAPARIN SODIUM 40 MG/0.4ML ~~LOC~~ SOLN
40.0000 mg | SUBCUTANEOUS | Status: DC
  Administered 2010-11-27 – 2010-11-28 (×2): 40 mg via SUBCUTANEOUS
  Filled 2010-11-26 (×4): qty 0.4

## 2010-11-26 MED ORDER — SENNA 8.6 MG PO TABS
2.0000 | ORAL_TABLET | Freq: Every day | ORAL | Status: DC | PRN
  Filled 2010-11-26: qty 2

## 2010-11-26 MED ORDER — DULOXETINE HCL 30 MG PO CPEP
30.0000 mg | ORAL_CAPSULE | Freq: Two times a day (BID) | ORAL | Status: DC
  Administered 2010-11-27 – 2010-11-28 (×4): 30 mg via ORAL
  Filled 2010-11-26 (×6): qty 1

## 2010-11-26 MED ORDER — TEMAZEPAM 15 MG PO CAPS
30.0000 mg | ORAL_CAPSULE | Freq: Every evening | ORAL | Status: DC | PRN
  Administered 2010-11-26 – 2010-11-27 (×2): 30 mg via ORAL
  Filled 2010-11-26: qty 2

## 2010-11-26 MED ORDER — ESTROGENS CONJUGATED 0.625 MG PO TABS
0.6250 mg | ORAL_TABLET | Freq: Every day | ORAL | Status: DC
  Administered 2010-11-27 – 2010-11-28 (×2): 0.625 mg via ORAL
  Filled 2010-11-26 (×3): qty 1

## 2010-11-26 MED ORDER — POTASSIUM CHLORIDE IN NACL 20-0.9 MEQ/L-% IV SOLN
INTRAVENOUS | Status: DC
  Administered 2010-11-27: 05:00:00 via INTRAVENOUS
  Filled 2010-11-26 (×3): qty 1000

## 2010-11-26 MED ORDER — ONDANSETRON HCL 4 MG/2ML IJ SOLN: 4.0000 mg | Freq: Four times a day (QID) | INTRAMUSCULAR | Status: DC | PRN

## 2010-11-26 NOTE — Consult Note (Signed)
[name], [name]                 ACCOUNT NO.:  [id number]  MEDICAL RECORD NO.:  [id number]  LOCATION:  5526                         FACILITY:  [facility]  PHYSICIAN:  Franchot Gallo, MD     DATE OF BIRTH:  1950-10-04  DATE OF CONSULTATION:  11/26/2010 DATE OF DISCHARGE:                                CONSULTATION   REASON FOR CONSULTATION:  "Ongoing medication management."  S:  HISTORY OF PRESENT ILLNESS:  [name] is a 60 year old, single white female, who has a long history of depressive symptoms.  The patient was recently hospitalized at [facility] in September 2012 for an extended stay related to the difficulty of treating her depressive symptoms.  The patient was discharged on November 04, 2010, on the following medications: 1. Klonopin 1 mg p.o. every 8 a.m. and 2 p.m. 2. Premarin 6.25 mg p.o. daily. 3. Colace 100 mg p.o. daily. 4. Cymbalta 60 mg p.o. daily 8 a.m. and 2 p.m. 5. Lunesta 3 mg p.o. every 8:30 p.m. 6. Gabapentin 800 mg p.o. every 8 a.m., 2 p.m., and 8:30 p.m. 7. Orap 2 mg 1.5 tablets p.o. q.a.m. and 1 tablet p.o. every 8:30 p.m. 8. Zoloft 100 mg p.o. q.a.m. 9. Keppra 500 mg p.o. b.i.d. 10.Metoprolol 25 mg p.o. q.a.m. 11.Quinapril 20 mg p.o. q.a.m.  The patient was given the diagnoses as listed below: Axis I: 1. Major depressive disorder-recurrent. 2. Generalized anxiety disorder. 3. Trichotillomania. Axis II:  Deferred. Axis III:  Seizure disorder, hypertension, and fibromyalgia. Axis IV:  Limited primary support system.  Chronic health issues. Recent death of son. Axis V:  Global Assessment of Functioning at the time of admission approximately 35.  Global Assessment of Functioning at time of discharge 65.  The patient states that she was doing very well on her psychiatric medications until approximately 2 weeks prior to admission when she began to experience a staggered gait and difficulty with her balance.  The patient reports that she  was admitted to [facility] for medical evaluation on November 24, 2010, for evaluation of the symptoms.  [name] reports that currently she is feeling well and reports sleeping well without difficulty and reports good appetite.  She reports some mild feelings of sadness, anhedonia, and depressed mood, but denies any current suicidal or homicidal ideations.  The patient denies any auditory or visual hallucinations or delusional thinking.  The patient reports that her trichotillomania has worsened secondary to not being restarted on the medication Orap in the hospital.  The patient is very concerned about not being able to take medications as prescribed during her hospitalization at [facility], stating that these medications "help me more than anything in the past..  O:  MENTAL STATUS EXAM:  General - the patient was alert and oriented x3. She was friendly and cooperative with this provider.  Speech was appropriate in terms of rate and volume.  Mood appeared mildly depressed.  Affect was slightly constricted.  Thoughts - the patient denied any obvious delusions or hallucinations, nor did she report any suicidal or homicidal ideations.  Judgment and insight both appeared fair to good.  The patient was noted to be pulling at  her hair as she was prior to starting around for her trichotillomania.  LABORATORY STUDIES:  Sodium today was 133 and was 128 at the time of admission.  SGOT was 77, which was 106 at time of admission.  SGPT was 60 today with it being 62 at the time of admission.  The patient's CK was 1202.  A:  Axis I: 1. Major depressive disorder - recurrent - currently under fair to     good control. 2. Generalized anxiety disorder - currently under fair to good     control. 3. Trichotillomania - currently under fair to poor control. Axis II:  Deferred. Axis III: 1. Seizure disorder. 2. Hypertension. 3. Fibromyalgia. 4. Recent hyponatremia. Axis IV:  Limited  primary support system.  Chronic health issues. Recent death of son. Axis V:  Global Assessment of Functioning at the time of admission approximately 55.  Current Global Assessment of Functioning approximately 65.  P:  PLAN OF TREATMENT:  This is a very complex case, since the patient was recently hospitalized for approximately 30 days for treatment resistant depression.  She also has significant trichotillomania and anxiety symptoms.  The patient reported that the medications Cymbalta and Neurontin helped more than any medications have in the past.  However, the medication Neurontin can cause hyponatremia, but a low incident of 0.1%.  Cymbalta is also known rarely to cause hyponatremia with one reported case in the literature.    Cymbalta however is commonly known to increase liver enzymes in some patient, who do not tolerating the medication well from a metabolic standpoint.  1. I would recommend that the patient be continued on Cymbalta at 30     mg p.o. b.i.d. as currently written and her liver enzymes monitored     to see if they are returning to normal.  If her liver enzymes     continued to remain elevated over the next 1-2 days, I would     recommend this medication be discontinued and the patient started     on Effexor XR at 75 mg p.o. q.a.m.  Effexor is an SNRI antidepressant     and is most similar to Cymbalta.  Even though, it does not appear     to have the same effects on pain management as Cymbalta; it however can     be helpful for pain. 2. At this time, I would be reluctant to restart the medication     Neurontin secondary to its effects on gait as well as its rare, but     possible potential for hyponatremia.  It may be appropriate to     discuss with the patient the possibility of starting the medication     Lyrica, which is Pregabalin instead of the medication Neurontin which is     gabapentin.  This medication may result in some improvement in her      fibromyalgia pain. 3. Although the patient tolerated the medication Orap without     difficulties, I have concerns that she may have also been     developing possible neuroleptic malignancy syndrome considering the     elevation in her CK.  This would also explain her confusion and her     staggered gait.  With trichotillomania, the medication Risperdal     may be helpful at 0.5 to 1 mg at bedtime to help address her     trichotillomania. 4. I would recommend that the patient continued to be monitored on a  regular basis by mental health during her hospitalization in order     to assist with regulating her psychiatric medications.  I appreciate the opportunity to help in the care of this patient. I will notify the consult physician to continue to monitor the patient on a regular basis.    _________________________________ Franchot Gallo, MD     RR/MEDQ  D:  11/26/2010  T:  11/26/2010  Job:  [id number]  Electronically Signed by Franchot Gallo MD on 11/26/2010 02:08:01 PM

## 2010-11-26 NOTE — Progress Notes (Signed)
  [name], [name] NO.:  Williamson  MEDICAL RECORD NO.:  [id number]  LOCATION:  5526                         FACILITY:  [facility]  PHYSICIAN:  [name] Sacks, MD    DATE OF BIRTH:  08-Jan-1951                                PROGRESS NOTE   BRIEF NARRATIVE: This is a 60 year old woman with a history of depression who was recently discharged from [facility] on November 02, 2010, where she was treated for prolonged stay for depression.  She presented with progressive shaking of her hands and body for several weeks and recurrent falls.  She was found to have hyponatremia and elevated CPK. She was admitted for gait disturbance, hyponatremia, abnormal liver function tests, and elevated CPK.  ASSESSMENT/PLAN: 1. History of falls with gait instability.  She is improving and has     done better with PT today.  It has been recommended that she use a     cane and home health PT.  She was assessed by Occupational Therapy     and not felt to have any followup OT needs.  She had no focal     neurologic deficits and I do not think MRI or CT of the head would     add much at this point.  She has mildly elevated liver function     tests, which can be secondary to Cymbalta.  We will continue to     monitor these at this time.  She is also noted to have an elevated     total CK, which is possibly secondary to falls and possibly early     development of neuroleptic malignant syndrome secondary to Orap.     This was discontinued.  She has been evaluated by Psychiatry and it     has been recommended that the Orap be discontinued and she be     placed on Risperdal. 2. Hyponatremia.  This appears to be stable and is probably mild SIADH     secondary to Zoloft and Cymbalta.  Zoloft has been discontinued.     We will continue to monitor on Cymbalta. 3. Fibromyalgia.  Consider Lyrica for pain.  This could be considered     in the outpatient setting. 4. Depression.   Continued recommendations as per Psychiatry. 5. Appreciate Psychiatry's continued involvement in this patient's     care.     [name] Sacks, MD     DG/MEDQ  D:  11/26/2010  T:  11/26/2010  Job:  [id number]  Electronically Signed by [name]  on 11/26/2010 03:36:33 PM

## 2010-11-26 NOTE — H&P (Signed)
[name], [name] NO.:  [id number]  MEDICAL RECORD NO.:  [id number]  LOCATION:  [facility]                         FACILITY:  [facility]  PHYSICIAN:  Vania Rea, M.D. DATE OF BIRTH:  11/17/1950  DATE OF ADMISSION:  11/24/2010 DATE OF DISCHARGE:                             HISTORY & PHYSICAL   PRIMARY CARE PHYSICIAN:  Maryelizabeth Rowan, MD.  PSYCHIATRIST:  Dr. Evelene Croon at [facility].  CHIEF COMPLAINT:  Recurrent falls since this week.  HISTORY OF PRESENT ILLNESS:  This is a 60 year old Caucasian lady with a lifelong history of depression, who suffered an acute exacerbation of her depression associated with the recent death of her son from cancer and was voluntarily committed to [facility] on October 12, 2010.  At the time of admission, the patient's psychiatric medications included only Prozac and bedtime temazepam, but because of her severe depressive symptoms and trichotillomania, she had multiple manipulations of her psychiatric medications and at discharge, she was on multiple psychotropics including Klonopin.  Initially, the patient had high-dose gabapentin added to her regimen.  Her Restoril was discontinued and her Prozac was discontinued.  The patient was discharged from the Psychiatric Service on November 02, 2010 and she now presents complaining of progressive shaking of the hands and the body for the past few weeks and said the shaking started in the thumb and fingers and has now progressed to most of her body.  She is very unsteady for the past week. She has been having recurrent falls, and she is also progressively weak. In the emergency room, the patient was evaluated and she was noted to be hyponatremic and also noticed to have an elevated CPK.  The patient says she does drink a lot of water, but she does not think it is excessive.  She drinks probably 4 glasses of water per day.  She has not been having any  muscular pains.  She does not take a statin. She does not have a history of hyperlipidemia.  She denies any fever, cough, or cold.  She denies chest pains or shortness of breath.  She denies headache, nausea, or vomiting.  She is very insistent that she has been helped in particular by the Cymbalta and does not wish to be discontinued.  Of note, reviewing labs drawn back in September, her liver functions were completely normal with normal AST and ALT.  Her sodium was 137 at that time.  Her thyroid function tests were likewise completely normal.  She has no history of drug abuse, and her urinalysis and urine drug screen at that time was also normal.  PAST MEDICAL HISTORY: 1. Hypertension. 2. Seizure disorder. 3. Fibromyalgia. 4. Major depression. 5. Status post hysterectomy. 6. Status post clipping of a cerebral aneurysm in 1997 at [facility].  MEDICATIONS:  Medication reconciliation is pending, but at discharge on November 02, 2010, medications included: 1. Klonopin 1 mg twice daily. 2. Premarin 6.25 mg daily. 3. Colace 100 mg daily. 4. Cymbalta 60 mg twice daily. 5. Lunesta 3 mg each evening. 6. Gabapentin 800 mg 3 times daily. 7. Orap 2 mg tablets 1-1/2 tablets in the  morning and 1 tablet at     bedtime. 8. Zoloft 100 mg daily. 9. Keppra 500 mg twice daily. 10.Metoprolol 25 mg daily. 11.Quinapril 20 mg daily.  She was advised to stop taking Remeron.  ALLERGIES:  No known drug allergies.  SOCIAL HISTORY:  She denies tobacco, alcohol, or illicit drug use.  She is divorced.  She lives with her friend.  FAMILY HISTORY:  She reports significant cancers and cardiac disease.  REVIEW OF SYSTEMS:  Other than noted above, complete review of systems is unremarkable.  PHYSICAL EXAMINATION:  GENERAL:  Depressed looking middle-aged Caucasian lady, reclining in the stretcher, not severely ill looking, but there is some mental confusion during the interview.  She has  difficulties remembering the dates or for instance, she is unclear about when her falls really started and very often is confused about meanings of terms. She seems to have difficulty understanding that she is being admitted. VITAL SIGNS:  Temperature is 98.86, her pulse is 79, respirations 18, blood pressure 163/94.  She is saturating at 99% on room air. HEENT:  Her pupils are round and equal.  Mucous membranes pink. Anicteric. NECK:  No cervical lymphadenopathy.  No thyromegaly or carotid bruits. CHEST:  Clear to auscultation bilaterally. CARDIOVASCULAR:  Regular rhythm without murmur. ABDOMEN:  Mildly obese, soft, nontender.  No masses. EXTREMITIES:  Without edema.  Muscles are well developed.  Dorsalis pedis pulses 3+ and bounding. SKIN:  She has a healing linear abrasion on the right lower leg, which she reports is due to a fall.  She has old bruise on her right hip. CENTRAL NERVOUS SYSTEM:  Cranial nerves II through XII are grossly intact.  She has no focal lateralizing signs.  LABS:  Her CBC is unremarkable with a white count of 7.8, hemoglobin 12.4, and platelets 309.  Complete metabolic panel:  Her sodium is 128, potassium 4.0, chloride 96, CO2 of 25, glucose 84, BUN 11, creatinine 0.65, calcium 8.9, total protein 7.0, albumin 3.7, AST is elevated at 106, ALT elevated at 62, alk phos 92, bilirubin is 0.2.  Her total CK is 2400.  No imaging has been done.  ASSESSMENT: 1. Gait abnormality, questionable related to medication side effects. 2. Hyponatremia, again questionable related to medication side     effects, questionable related to excess water ingestion. 3. Major depression. 4. Hypertension, uncontrolled. 5. Abnormal liver function tests, questionable related to medication     side effect. 6. Elevated CPK, questionable mild rhabdomyolysis.  PLAN: 1. We will bring this lady on observation for evaluation of her     laboratory abnormalities.  We will do hyponatremia  workup.  While     that is pending, we will hydrate her with normal saline, give her     the benefit of a physical therapy consult.  We will also have a     tele psych consult for possible adjustment of her psychotropic     medications.  We will get the pharmacist to medication     reconciliation and find out exactly what type of metoprolol she is     taking and make adjustments for improved blood pressure control as     necessary. 2. We will fractionate her total CK and we will also check a troponin. 3. We will get a chest x-ray. 4. We are unable to get an MRI because of the history of the     aneurysmal clip and we are unsure of the details.  I NOT am sure  that     an MRI will necessarily be useful at this time. 5. Other plans as per orders.      Vania Rea, M.D.     LC/MEDQ  D:  11/24/2010  T:  11/24/2010  Job:  [id number]   ADDENDUM 11/26/2010 patient insists She is taking only 30mg  of Cymbalta Bid, contrary to the [facility] d/c summary, and this was apparently confirmed by the Pharmacy Tech. Patient also reports that she continues to take a second BZD, Restoril, prescribed by her PCP, even though  she has been advised to stop, becasue she has side-effects from [place].  cc:   Maryelizabeth Rowan, M.D. Dr. Evelene Croon  Electronically Signed by Vania Rea M.D. on 11/26/2010 11:28:03 AM

## 2010-11-27 DIAGNOSIS — F331 Major depressive disorder, recurrent, moderate: Secondary | ICD-10-CM

## 2010-11-27 LAB — COMPREHENSIVE METABOLIC PANEL
ALT: 54 U/L — ABNORMAL HIGH (ref 0–35)
AST: 53 U/L — ABNORMAL HIGH (ref 0–37)
Albumin: 3.2 g/dL — ABNORMAL LOW (ref 3.5–5.2)
Alkaline Phosphatase: 81 U/L (ref 39–117)
Chloride: 102 mEq/L (ref 96–112)
Potassium: 4.3 mEq/L (ref 3.5–5.1)
Sodium: 135 mEq/L (ref 135–145)
Total Bilirubin: 0.3 mg/dL (ref 0.3–1.2)
Total Protein: 6.3 g/dL (ref 6.0–8.3)

## 2010-11-27 LAB — CK TOTAL AND CKMB (NOT AT ARMC): Total CK: 624 U/L — ABNORMAL HIGH (ref 7–177)

## 2010-11-27 MED ORDER — METOPROLOL TARTRATE 25 MG PO TABS
25.0000 mg | ORAL_TABLET | Freq: Once | ORAL | Status: AC
  Administered 2010-11-27: 25 mg via ORAL
  Filled 2010-11-27: qty 1

## 2010-11-27 MED ORDER — LISINOPRIL 20 MG PO TABS
20.0000 mg | ORAL_TABLET | Freq: Every day | ORAL | Status: DC
  Administered 2010-11-27 – 2010-11-28 (×2): 20 mg via ORAL
  Filled 2010-11-27 (×2): qty 1

## 2010-11-27 MED ORDER — METOPROLOL SUCCINATE ER 25 MG PO TB24
25.0000 mg | ORAL_TABLET | Freq: Every day | ORAL | Status: DC
  Administered 2010-11-28: 25 mg via ORAL
  Filled 2010-11-27: qty 1

## 2010-11-27 NOTE — Progress Notes (Signed)
Pt stated she had black, tarry stools.  Placed hat in toilet and instructed pt to call when she had her next BM and we would notify MD as necessary.

## 2010-11-27 NOTE — Progress Notes (Signed)
PROGRESS NOTE  [name] ZOX:[id number] DOB: February 12, 1950 DOA: 11/24/2010  Subjective: She feels better. No complaints. The nurse noted that she had a black stool. Patient denies any blood. Otherwise she feels better today.  Objective: Filed Vitals:   11/27/10 1028  BP: 146/84  Pulse: 91  Temp: 97.4 F (36.3 C)  Resp: 18    Intake/Output Summary (Last 24 hours) at 11/27/10 1154 Last data filed at 11/27/10 1017  Gross per 24 hour  Intake 1808.33 ml  Output   2450 ml  Net -641.67 ml    Exam: General: Patient appears well today. Cardiovascular: Regular rate and rhythm no murmur or gallop. No lower extremity edema. Respiratory: Clear to patient bilaterally. No wheezes rales or rhonchi. Respiratory effort normal.    BRIEF NARRATIVE:  This is a 60 year old woman with a history of depression who was recently discharged from [facility] on November 02, 2010, where she was treated for prolonged stay for depression. She presented with progressive shaking of her hands and body for several weeks and  recurrent falls. She was found to have hyponatremia and elevated CPK. She was admitted for gait disturbance, hyponatremia, abnormal liver function tests, and elevated CPK.   Assessment/Plan: 1. History of falls with gait instability.  It has been recommended that she use a cane and home health PT. She was assessed by Occupational Therapy and not felt to have any followup OT needs. She had no focal neurologic deficits and I do not think MRI or CT of the head would add much at this point. She has mildly elevated liver function tests, which can be secondary to Cymbalta. This appears to be stable at this time. She is also noted to have an elevated  total CK, which is possibly secondary to falls and possibly early development of neuroleptic malignant syndrome secondary to Orap. This was discontinued. She has been evaluated by Psychiatry and it has been recommended that the Orap be  discontinued and she be placed on Risperdal.  2. Hyponatremia. This has resolved and is probably mild SIADH secondary to Zoloft and Cymbalta. Zoloft has been discontinued. We will continue to monitor on Cymbalta.  3. Fibromyalgia. Consider Lyrica for pain. This could be considered in the outpatient setting.  4. Depression. Continued recommendations as per Psychiatry.   Appreciate Psychiatry's continued involvement in this patient's care.  The clinical significance of the described black stools unclear. Patient said no obvious bleeding. We will repeat a CBC in the morning to follow this up. Unless her hemoglobin has significantly changed would anticipate discharge home November 5.  Code Status: Full    Disposition Plan: Home tomorrow.    PCP: Maryelizabeth Rowan, MD   LOS: 3 days   Pandora Leiter MD   Data Reviewed: Results for orders placed during the hospital encounter of 11/24/10 (from the past 24 hour(s))  COMPREHENSIVE METABOLIC PANEL     Status: Abnormal   Collection Time   11/27/10  6:20 AM      Component Value Range   Sodium 135  135 - 145 (mEq/L)   Potassium 4.3  3.5 - 5.1 (mEq/L)   Chloride 102  96 - 112 (mEq/L)   CO2 25  19 - 32 (mEq/L)   Glucose, Bld 86  70 - 99 (mg/dL)   BUN 10  6 - 23 (mg/dL)   Creatinine, Ser 8.11  0.50 - 1.10 (mg/dL)   Calcium 9.0  8.4 - 91.4 (mg/dL)   Total Protein 6.3  6.0 - 8.3 (g/dL)  Albumin 3.2 (*) 3.5 - 5.2 (g/dL)   AST 53 (*) 0 - 37 (U/L)   ALT 54 (*) 0 - 35 (U/L)   Alkaline Phosphatase 81  39 - 117 (U/L)   Total Bilirubin 0.3  0.3 - 1.2 (mg/dL)   GFR calc non Af Amer >90  >90 (mL/min)   GFR calc Af Amer >90  >90 (mL/min)  CK TOTAL AND CKMB     Status: Abnormal   Collection Time   11/27/10  6:20 AM      Component Value Range   Total CK 624 (*) 7 - 177 (U/L)   CK, MB 9.3 (*) 0.3 - 4.0 (ng/mL)   Relative Index 1.5  0.0 - 2.5    Studies/Results: Dg Chest 1 View  11/24/2010  *RADIOLOGY REPORT*  Clinical Data: Weakness and  dizziness  CHEST - 1 VIEW  Comparison: None.  Findings: Normal heart size and normal vascularity.  Negative for pneumonia or effusion.  Apical pleural and parenchymal scarring bilaterally.  IMPRESSION: No active cardiopulmonary disease.  Original Report Authenticated By: Camelia Phenes, M.D.    Scheduled Meds:   . clonazePAM  1 mg Oral BID  . DULoxetine  30 mg Oral BID  . enoxaparin (LOVENOX) injection  40 mg Subcutaneous Q24H  . estrogens (conjugated)  0.625 mg Oral Daily  . levETIRAcetam  500 mg Oral BID  . risperiDONE  0.5 mg Oral QHS   Continuous Infusions:   . DISCONTD: 0.9 % NaCl with KCl 20 mEq / L 100 mL/hr at 11/27/10 0500

## 2010-11-27 NOTE — Consult Note (Signed)
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  She was admitted with complaints of sudden loss of balance when getting out of bed at night, twice before she was admitted.  She also complained of prior loss of balance and falling out of bed when at [facility] (third admission).   She said she was at [facility] about two weeks ago for depressed mood.  She says she has had chronic depression with a paternal family line positive for major depression.  Her paternal aunt committed suicide.  She has had passive thoughts of suicide with a plan of walking into the ocean and never coming back. She also complained of shaking hands that impaired her handwriting. She has lost balance and tends to lean to the left when she does.  She has frequent headaches triggered by stress. This suicidal thought is more frequent since her son, [name], died of esophageal cancer Jun 12, 2010.  Since then she thinks of dying to be with him.  She was a single mother for a long time.  She is divorced.  She has a grandson age 65 in [place] who might be the only reason to be alive; but thinks she cannot 'wrap her life around him'.  She has been living with a retired friend.  They share the household work.  She thinks her life is in a turmoil and she does not know what to do.  She believes she needs to find part time work to keep her mind on something else.   She also suffers from insomnia, fibromyalgia, hypertension.  She has a cerebral aneurysm, clipped in 06-12-1995.  An MRI of brain was cancelled due to clips present in her brain.  She also says she has impaired Right ear hearing and poor vision.  She has not seen an ophthalmologist in years. MENTAL STATUS EXAMINATION:  [name] is alert and oriented to person place and situation.  She says her mood is sad and she cannot stop thinking about her son.  She is unable to enjoy activities with friends, she cannot make decisions and feels like she is in a  turmoil.  She is positive about her antidepressant treatment and has no complaints.  She is adherent to the regiment and sees a psychiatrist for prescriptions. She sleeps well with temazepam.  She denies active Suicidal / Homicidal ideation with a plan.  She is feeling very angry about Ryan's death.  She feels abandoned because she was a single parent for so long.  Her thought process is intact, organized and logical.  Her insight is fair.  Her judgment is good.  She agrees that she would engage in outpatient therapy.   RECOMMENDATION:  The antidepressant medications appear to be compatible.  The SNRI Cymbalta is effective for both depression and pain.  Neurontin is taken twice a day.  Temazepam is effective for insomnia. Outpatient grief therapy is recommended.  She identifies some aspects of her bereavement but is in early stages of coming to grips with her independent life.   Thank you for this consultation.

## 2010-11-28 DIAGNOSIS — R2681 Unsteadiness on feet: Secondary | ICD-10-CM | POA: Diagnosis present

## 2010-11-28 DIAGNOSIS — E871 Hypo-osmolality and hyponatremia: Secondary | ICD-10-CM | POA: Diagnosis present

## 2010-11-28 LAB — CBC
HCT: 40.8 % (ref 36.0–46.0)
Hemoglobin: 13.8 g/dL (ref 12.0–15.0)
WBC: 7.3 10*3/uL (ref 4.0–10.5)

## 2010-11-28 MED ORDER — CLONAZEPAM 1 MG PO TABS: 1.0000 mg | ORAL_TABLET | Freq: Two times a day (BID) | ORAL | Status: DC

## 2010-11-28 MED ORDER — TEMAZEPAM 30 MG PO CAPS: 30.0000 mg | ORAL_CAPSULE | Freq: Every evening | ORAL | Status: DC | PRN

## 2010-11-28 MED ORDER — DULOXETINE HCL 30 MG PO CPEP: 30.0000 mg | ORAL_CAPSULE | Freq: Two times a day (BID) | ORAL | Status: DC

## 2010-11-28 MED ORDER — RISPERIDONE 0.5 MG PO TABS: 0.5000 mg | ORAL_TABLET | Freq: Every day | ORAL | Status: DC

## 2010-11-28 NOTE — Progress Notes (Signed)
Patient discharged to home with family, discharged instructions given and reviewed with patient.  Patient verbalized understanding, care notes given for new meds and pertinent education. Skin intact at discharge, IV discharged and intact. Patient escorted to car via wheelchair by NS/MT. Braelynn Benning Laural Benes, RN 11/28/10 @1630 

## 2010-11-28 NOTE — Progress Notes (Signed)
Occupational Therapy Evaluation Patient Details Name: [name] MRN: [id number] DOB: [birth year] Today's Date: 11/28/2010  Problem List:  Patient Active Problem List  Diagnoses  . Ulnar neuropathy  . Depression  . Insomnia  . Seizure  . Recurrent UTI  . Arthritis    Past Medical History:  Past Medical History  Diagnosis Date  . Depression   . Seizures    Past Surgical History:  Past Surgical History  Procedure Date  . Cerebral aneurysm repair   . Abdominal hysterectomy     OT Assessment/Plan/Recommendation OT Assessment Clinical Impression Statement:  (no further OT needs) OT Recommendation/Assessment: Patient does not need any further OT services OT Recommendation Equipment Recommended: Cane;Other (comment) (and reacher - pt to pick up on her own) OT Goals    OT Evaluation Precautions/Restrictions  Precautions Precautions: Fall Prior Functioning Home Living Lives With: Ophelia Shoulder) Receives Help From: Family;Friend(s) Bathroom Shower/Tub: Engineer, manufacturing systems: Standard Home Adaptive Equipment: None Prior Function Level of Independence: Independent with basic ADLs;Independent with homemaking with ambulation;Independent with gait;Independent with transfers Able to Take Stairs?: Yes Vocation: Retired ADL ADL Eating/Feeding: Independent Where Assessed - Eating/Feeding: Edge of bed Grooming: Performed Where Assessed - Grooming: Standing at sink Upper Body Bathing: Simulated Where Assessed - Upper Body Bathing: Sitting, bed Lower Body Bathing: Independent Where Assessed - Lower Body Bathing: Sit to stand from bed Upper Body Dressing: Simulated;Independent Where Assessed - Upper Body Dressing: Sit to stand from bed Lower Body Dressing: Independent Where Assessed - Lower Body Dressing: Sit to stand from chair Toilet Transfer: Independent;Performed Toilet Transfer Method: Proofreader: Regular height toilet Toileting -  Clothing Manipulation: Performed;Independent Where Assessed - Toileting Clothing Manipulation: Standing Toileting - Hygiene: Performed;Independent Where Assessed - Toileting Hygiene: Standing Tub/Shower Transfer: Not assessed (pt verbalized safety with this task) Tub/Shower Transfer Method: Not assessed Vision/Perception  Vision - History Baseline Vision: Wears glasses only for reading Vision - Assessment Eye Alignment: Within Functional Limits Cognition   Sensation/Coordination Coordination Gross Motor Movements are Fluid and Coordinated: Yes Fine Motor Movements are Fluid and Coordinated: Yes Extremity Assessment RUE Assessment RUE Assessment: Within Functional Limits Mobility  Bed Mobility Bed Mobility: Yes Supine to Sit: 7: Independent Transfers Transfers: Yes Sit to Stand: 7: Independent;From bed Stand to Sit: 7: Independent;To bed Exercises Other Exercises Other Exercises: I educated pt on safe balance exercises wiht pt standing in a corner and working on head turns and weight shifting with wall on both sides for safety.  I talked to pt about how to progress and to have friend nearby for safety End of Session OT - End of Session Activity Tolerance: Patient tolerated treatment well General Behavior During Session: [facility] for tasks performed Cognition: [facility] for tasks performed   Kirt Boys 11/28/2010, 12:09 PM

## 2010-11-28 NOTE — Progress Notes (Signed)
Spoke with patient about HHC. She chose [facility] from [facility] and set up HHPT  with  [facility]. Jacquelynn Cree RN, BSN, [place]  11/28/2010

## 2010-11-28 NOTE — Discharge Summary (Addendum)
Physician Discharge Summary  [name] ZOX:[id number] DOB: 1950-04-18 DOA: 11/24/2010  PCP: Maryelizabeth Rowan, MD  Admit date: 11/24/2010 Discharge date: 11/28/2010  Discharge Diagnoses:  BRIEF NARRATIVE:   1. History of falls with gait instability.  2. Hyponatremia.  3. Transaminitis of unclear etiology 4. Depression.   Discharge Condition: Improved.  Disposition: Home with home health physical therapy. Patient wants to return home today.  Hospital Course:  This is a 60 year old woman with a history of depression who was recently discharged from [facility] on November 02, 2010, where she was treated for prolonged stay for depression. She presented with progressive shaking of her hands and body for several weeks and  recurrent falls. She was found to have hyponatremia and elevated CPK. She was admitted for gait disturbance, hyponatremia, abnormal liver function tests, and elevated CPK. She had no focal deficits or lateralizing signs to suggest CNS event.  Etiology of her gait instability is not entirely clear but thought to be most likely related to polypharmacy. She was seen in consultation with psychiatry and her medications were adjusted. Specifically Zoloft was discontinued as was Neurontin. Orap was also discontinued as it was thought that this might have been causing gait instability. She did have a mildly elevated CK on admission which has steadily improved. Psychiatrist there is the possibility of perhaps early neuroleptic malignant syndrome.  Her hyponatremia has resolved was probably related to Zoloft and Cymbalta. Suggest repeat his metabolic panel in the outpatient setting while she is on Cymbalta to follow this. Is also noted that she had mild elevation of her transaminases. These are trending towards normal INR less than 2 times normal. Cymbalta can cause elevated liver enzymes. As he is a trending to normal psychiatry recommended continuing current medication. Suggest  repeat hepatic function panel in the outpatient setting.  She is much improved. She has been followed by physical therapy is recommended outpatient or home health physical therapy. Discussed the case with physical therapist today who thought home health was an excellent option for her. Single-point cane has been recommended. In regard to her depression she was seen by the psychiatry social worker.  Initial medical reconciliation was not correct. The patient has bottles with her however she does not have all her medication bottles. She cannot tell me exactly what medication she is on. I received different answers when asked the same question. She is a friend at the bedside and we have reviewed her medications carefully to the best of our ability. I spent more than 40 minutes in attempt to reconcile her medications. I have gone over carefully what she should be taking when she is discharged. I stressed the absolute need to keep a list of her medications and bring it to each doctor's appointment. The patient does not appear to be compliant with medication she is prescribed and does not appear to take them as directed she reports secondary to financial concerns. I have not given her a prescription for any of her medications the exception of Cymbalta which has been sent electronically. She will need a followup with her regular doctor to refill her Klonopin as well as her temazepam.  Consults: #1 psychiatry #2 physical therapy.  Discharge Exam: Filed Vitals:   11/28/10 0505  BP: 117/73  Pulse: 76  Temp:   Resp:   Respiratory rate grossly normal.  General: Appears well. Cardiovascular: Regular rate and rhythm no murmur, rub or gallop. Respiratory: Clear to auscultation bilaterally no wheezes rales or rhonchi. Normal respiratory effort.  Disposition: Home or Self Care with home health physical therapy.  Discharge Orders    Future Orders Please Complete By Expires   Diet - low sodium heart healthy       Increase activity slowly      Comments:   Use a single-point cane.   Discharge instructions      Comments:   Follow up with your primary care physician. Continue to follow-up for depression. Discontinue gabapentin. Discontinue Zoloft. Discontinue Lunesta. Continue quinapril.   Call MD for:  extreme fatigue        Current Discharge Medication List    CONTINUE taking these medications   Details  clonazePAM (KLONOPIN) 1 MG tablet Take 1 tablet (1 mg total) by mouth 2 (two) times daily.note this is a chronic medication. No prescription was given. You have refills on file with [facility].           CONTINUE these medications which have CHANGED   Details  DULoxetine (CYMBALTA) 30 MG capsule Take 1 capsule (30 mg total) by mouth 2 (two) times daily. Take at 8am and 2pm Qty: 60 capsule, Refills: 0      CONTINUE these medications which have NOT CHANGED   Details  docusate sodium (COLACE) 100 MG capsule Take 100 mg by mouth daily as needed. For constipation      estrogens, conjugated, (PREMARIN) 0.625 MG tablet Take 0.625 mg by mouth every morning.      levETIRAcetam (KEPPRA) 500 MG tablet Take 1 tablet (500 mg total) by mouth 2 (two) times daily. Qty: 60 tablet, Refills: 3    metoprolol succinate (TOPROL-XL) 25 MG 24 hr tablet Take 25 mg by mouth daily.     temazepam (RESTORIL) 30 MG capsule. Take one tablet each evening by mouth. Call your physician to obtain a refill. No prescription given.    STOP taking these medications     pimozide (ORAP) 2 MG tablet   gabapentin   zoloft            Things to follow up in the outpatient setting: See above.  Time coordinating discharge: 90 minutes.  The results of significant diagnostics from this hospitalization (including imaging, microbiology, ancillary and laboratory) are listed below for reference.    Significant Diagnostic Studies: Dg Chest 1 View  11/24/2010  *RADIOLOGY REPORT*  Clinical Data: Weakness and  dizziness  CHEST - 1 VIEW  Comparison: None.  Findings: Normal heart size and normal vascularity.  Negative for pneumonia or effusion.  Apical pleural and parenchymal scarring bilaterally.  IMPRESSION: No active cardiopulmonary disease.  Original Report Authenticated By: Camelia Phenes, M.D.   Labs: Results for orders placed during the hospital encounter of 11/24/10 (from the past 48 hour(s))  COMPREHENSIVE METABOLIC PANEL     Status: Abnormal   Collection Time   11/27/10  6:20 AM      Component Value Range Comment   Sodium 135  135 - 145 (mEq/L)    Potassium 4.3  3.5 - 5.1 (mEq/L)    Chloride 102  96 - 112 (mEq/L)    CO2 25  19 - 32 (mEq/L)    Glucose, Bld 86  70 - 99 (mg/dL)    BUN 10  6 - 23 (mg/dL)    Creatinine, Ser 9.32  0.50 - 1.10 (mg/dL)    Calcium 9.0  8.4 - 10.5 (mg/dL)    Total Protein 6.3  6.0 - 8.3 (g/dL)    Albumin 3.2 (*) 3.5 - 5.2 (g/dL)  AST 53 (*) 0 - 37 (U/L)    ALT 54 (*) 0 - 35 (U/L)    Alkaline Phosphatase 81  39 - 117 (U/L)    Total Bilirubin 0.3  0.3 - 1.2 (mg/dL)    GFR calc non Af Amer >90  >90 (mL/min)    GFR calc Af Amer >90  >90 (mL/min)   CK TOTAL AND CKMB     Status: Abnormal   Collection Time   11/27/10  6:20 AM      Component Value Range Comment   Total CK 624 (*) 7 - 177 (U/L)    CK, MB 9.3 (*) 0.3 - 4.0 (ng/mL) CRITICAL VALUE NOTED.  VALUE IS CONSISTENT WITH PREVIOUSLY REPORTED AND CALLED VALUE.   Relative Index 1.5  0.0 - 2.5    CBC     Status: Normal   Collection Time   11/28/10  6:30 AM      Component Value Range Comment   WBC 7.3  4.0 - 10.5 (K/uL)    RBC 4.40  3.87 - 5.11 (MIL/uL)    Hemoglobin 13.8  12.0 - 15.0 (g/dL)    HCT 45.4  09.8 - 11.9 (%)    MCV 92.7  78.0 - 100.0 (fL)    MCH 31.4  26.0 - 34.0 (pg)    MCHC 33.8  30.0 - 36.0 (g/dL)    RDW 14.7  82.9 - 56.2 (%)    Platelets 311  150 - 400 (K/uL)      Signed: Pandora Leiter MD 11/28/2010, 2:06 PM

## 2010-11-28 NOTE — Progress Notes (Signed)
CSW met with patient at bedside.Pt alert and oriented x4, no SI, AH, VH, or psychosis. Pt able to engage in assessment.  Full assessment in shadow chart, please review.  Pt also given resources for Hospice grief/bearvement counseling groups and schedule. Pt also given resource for [employer] of the [place].  Pt lives with friend, plan to return with friend and already established with [facility] for mental health needs and medications.  No other needs identified.  CSW will sign off.

## 2010-11-28 NOTE — Progress Notes (Signed)
Physical Therapy Treatment Patient Details Name: [name] MRN: [id number] DOB: 1950-06-05 Today's Date: 11/28/2010  PT Assessment/Plan  PT - Assessment/Plan Comments on Treatment Session: I focused on safety education - pt to use cane in community or unlevel surfaces (seh will get one herself).  We talked  about ways for pt to safely exercise her balance, head turns, etc.  I encouraged pt to get a reacher to help her not have to bend over as this is a source of her gettting dizzy and loosing balance.  I noticed pts eyes not always working together - I encouraged her to get eye exam  (she hasnt had one in years) to  see if this would help her balance.  we talked about safety with walking dogs.  she has sttrong one that has pulled her over before but she doesnt plan to walk this dog any more..  Pt would benefit from OPPT for  balance retraining but she has no insureance now so not tnterested but I educated her on where and what in case she will be able to access this in the future. PT Plan: Discharge plan remains appropriate PT Frequency: Min 3X/week Follow Up Recommendations: None Equipment Recommended: Cane;Other (comment) (and reacher - pt to pick up on her own) PT Goals  Acute Rehab PT Goals PT Goal Formulation: With patient Time For Goal Achievement: 2 weeks Pt will Transfer Sit to Stand/Stand to Sit: with modified independence PT Transfer Goal: Sit to Stand/Stand to Sit - Progress: Met Pt will Ambulate: 51 - 150 feet;with modified independence;with cane PT Goal: Ambulate - Progress: Progressing toward goal Pt will Perform Home Exercise Program: with supervision, verbal cues required/provided PT Goal: Perform Home Exercise Program - Progress: Progressing toward goal  PT Treatment Mobility (including Balance) Bed Mobility Bed Mobility: Yes Supine to Sit: 7: Independent Transfers Transfers: Yes Sit to Stand: 7: Independent;From bed Stand to Sit: 7: Independent;To  bed Ambulation/Gait Ambulation/Gait: Yes Ambulation/Gait Assistance: 6: Modified independent (Device/Increase time) Ambulation Distance (Feet): 150 Feet Assistive device: Straight cane Gait Pattern: Within Functional Limits (Pt mild balance loss with head turns) Gait velocity: normal gait speed - worked on Audiological scientist and doing head turns Stairs: No Naval architect Mobility: No    Exercise  Other Exercises Other Exercises: I educated pt on safe balance exercises wiht pt standing in a corner and working on head turns and weight shifting with wall on both sides for safety.  I talked to pt about how to progress and to have friend nearby for safety End of Session PT - End of Session Activity Tolerance: Patient tolerated treatment well Patient left: in bed General Behavior During Session: [facility] for tasks performed Cognition: [facility] for tasks performed  [name] 11/28/2010, 12:03 PM

## 2010-12-02 NOTE — Consult Note (Signed)
  [name], [name]                 ACCOUNT NO.:  [id number]  MEDICAL RECORD NO.:  [id number]  LOCATION:  5526                         FACILITY:  [facility]  PHYSICIAN:  Eulogio Ditch, MD DATE OF BIRTH:  1950-03-22  DATE OF CONSULTATION:  11/25/2010 DATE OF DISCHARGE:                                CONSULTATION   REASON FOR CONSULT:  Med management for depression.  HISTORY OF PRESENT ILLNESS:  A 60 year old Caucasian female who with a long history of depression.  Her depression worsened after the death of her son from cancer.  She was admitted at [facility] for approximately 3 weeks.  She was discharged on November 02, 2010.  The patient is currently admitted on the medical floor because of her unsteady gait, and feeling weak and recurrent falls.  The patient is very calm, cooperative, pleasant on approach, still have depressive symptoms, but her affect is appropriate.  She lives with a friend and told me that she has a dog and she exercised daily and goes out for a walk.  She told me that the combination she was discharged on from [facility] was working good for her and she do not want to make any changes to that.  The patient was on Klonopin 1 mg at 8 a.m. and at 2 p.m., Cymbalta 60 mg at 8 a.m. and 2 a.m., gabapentin 800 mg at 8, 2 and 8:30, Lunesta 3 mg at 8:30 p.m., Coreg 2 mg 1.5 daily, 1 tablet at 8:30 p.m., Zoloft 100 mg p.o. daily.  I will recommend to continue these medications.  SUBSTANCE ABUSE HISTORY:  The patient denies abusing any drugs or alcohol.  FAMILY PSYCH HISTORY:  None reported by her.  MEDICAL HISTORY:  History of a seizure disorder, hypertension and fibromyalgia.  PAST SURGICAL HISTORY:  Hysterectomy and cerebral aneurysm.  MENTAL STATUS EXAMINATION:  The patient is calm, cooperative during the interview, pleasant on approach.  No abnormal movements noticed.  Mood still have some depressive symptom, but she is not suicidal  or homicidal.  Her affect is very appropriate.  She was smiling throughout the interview.  She still have imbuement issue over the death of the son, who died because of the esophageal cancer at the age of 25.  She is not delusional or hallucinating.  Cognition alert, awake and oriented x3.  Memory, immediate, recent and remote fair.  Attention and concentration, fair.  Abstraction ability fair.  Insight and judgment intact.  RECOMMENDATIONS: 1. As discussed in the history of present illness, the patient should     be continued on her current medication regime     and she can follow up in the outpatient setting at [facility]. 2. I will also see the doctor on-call tomorrow to see the patient as     they follow up.  Thank you for involving me in taking care of this patient.     Eulogio Ditch, MD     SA/MEDQ  D:  11/25/2010  T:  11/25/2010  Job:  [id number]  Electronically Signed by Eulogio Ditch  on 12/02/2010 10:11:59 AM

## 2010-12-23 ENCOUNTER — Encounter (HOSPITAL_COMMUNITY): Payer: Self-pay | Admitting: Emergency Medicine

## 2010-12-23 ENCOUNTER — Emergency Department (HOSPITAL_COMMUNITY): Payer: Self-pay

## 2010-12-23 ENCOUNTER — Emergency Department (HOSPITAL_COMMUNITY)
Admission: EM | Admit: 2010-12-23 | Discharge: 2010-12-23 | Disposition: A | Discharge status: Home / Self Care | Payer: Self-pay | Attending: Emergency Medicine | Admitting: Emergency Medicine

## 2010-12-23 DIAGNOSIS — F411 Generalized anxiety disorder: Secondary | ICD-10-CM | POA: Insufficient documentation

## 2010-12-23 DIAGNOSIS — R1310 Dysphagia, unspecified: Secondary | ICD-10-CM | POA: Insufficient documentation

## 2010-12-23 DIAGNOSIS — Z79899 Other long term (current) drug therapy: Secondary | ICD-10-CM | POA: Insufficient documentation

## 2010-12-23 DIAGNOSIS — R12 Heartburn: Secondary | ICD-10-CM | POA: Insufficient documentation

## 2010-12-23 DIAGNOSIS — R079 Chest pain, unspecified: Secondary | ICD-10-CM | POA: Insufficient documentation

## 2010-12-23 DIAGNOSIS — F329 Major depressive disorder, single episode, unspecified: Secondary | ICD-10-CM | POA: Insufficient documentation

## 2010-12-23 DIAGNOSIS — R450 Nervousness: Secondary | ICD-10-CM | POA: Insufficient documentation

## 2010-12-23 DIAGNOSIS — R10816 Epigastric abdominal tenderness: Secondary | ICD-10-CM | POA: Insufficient documentation

## 2010-12-23 DIAGNOSIS — R300 Dysuria: Secondary | ICD-10-CM | POA: Insufficient documentation

## 2010-12-23 DIAGNOSIS — F3289 Other specified depressive episodes: Secondary | ICD-10-CM | POA: Insufficient documentation

## 2010-12-23 DIAGNOSIS — R1013 Epigastric pain: Secondary | ICD-10-CM | POA: Insufficient documentation

## 2010-12-23 LAB — URINALYSIS, ROUTINE W REFLEX MICROSCOPIC
Glucose, UA: NEGATIVE mg/dL
Ketones, ur: NEGATIVE mg/dL
Nitrite: POSITIVE — AB
Specific Gravity, Urine: 1.005 (ref 1.005–1.030)
pH: 6 (ref 5.0–8.0)

## 2010-12-23 LAB — CBC
Hemoglobin: 14.3 g/dL (ref 12.0–15.0)
MCH: 32 pg (ref 26.0–34.0)
MCV: 90.6 fL (ref 78.0–100.0)
RBC: 4.47 MIL/uL (ref 3.87–5.11)

## 2010-12-23 LAB — URINE MICROSCOPIC-ADD ON

## 2010-12-23 LAB — DIFFERENTIAL
Eosinophils Absolute: 0.2 10*3/uL (ref 0.0–0.7)
Eosinophils Relative: 2 % (ref 0–5)
Lymphs Abs: 2.6 10*3/uL (ref 0.7–4.0)
Monocytes Relative: 7 % (ref 3–12)

## 2010-12-23 LAB — POCT I-STAT TROPONIN I: Troponin i, poc: 0.01 ng/mL (ref 0.00–0.08)

## 2010-12-23 LAB — COMPREHENSIVE METABOLIC PANEL
Alkaline Phosphatase: 82 U/L (ref 39–117)
BUN: 11 mg/dL (ref 6–23)
Calcium: 9.1 mg/dL (ref 8.4–10.5)
GFR calc Af Amer: >90 mL/min (ref >90)
Glucose, Bld: 94 mg/dL (ref 70–99)
Total Protein: 7.1 g/dL (ref 6.0–8.3)

## 2010-12-23 LAB — LIPASE, BLOOD: Lipase: 16 U/L (ref 11–59)

## 2010-12-23 MED ORDER — ASPIRIN 81 MG PO CHEW
324.0000 mg | CHEWABLE_TABLET | Freq: Once | ORAL | Status: AC
  Administered 2010-12-23: 324 mg via ORAL
  Filled 2010-12-23: qty 4

## 2010-12-23 MED ORDER — LORAZEPAM 1 MG PO TABS
1.0000 mg | ORAL_TABLET | Freq: Once | ORAL | Status: AC
  Administered 2010-12-23: 1 mg via ORAL
  Filled 2010-12-23: qty 1

## 2010-12-23 MED ORDER — GI COCKTAIL ~~LOC~~
30.0000 mL | Freq: Once | ORAL | Status: AC
  Administered 2010-12-23: 30 mL via ORAL
  Filled 2010-12-23: qty 30

## 2010-12-23 MED ORDER — LORAZEPAM 1 MG PO TABS: 1.0000 mg | ORAL_TABLET | Freq: Three times a day (TID) | ORAL | Status: AC | PRN

## 2010-12-23 MED ORDER — LANSOPRAZOLE 30 MG PO CPDR: 30.0000 mg | DELAYED_RELEASE_CAPSULE | Freq: Every day | ORAL | Status: DC

## 2010-12-23 NOTE — ED Notes (Signed)
Pt c/o epigastric pain and heart burn x several weeks that is worse over the last week; pt sts feels like food is not going down into stomach; pt sts she is not eating a lot of food due to this; pt sts pain is burning in nature

## 2010-12-23 NOTE — ED Provider Notes (Signed)
History     CSN: [id number] Arrival date & time: 12/23/2010 12:57 PM   First MD Initiated Contact with Patient 12/23/10 1323      Chief Complaint  Patient presents with  . Abdominal Pain    (Consider location/radiation/quality/duration/timing/severity/associated sxs/prior treatment) HPI Comments: Patient reports that she has had intermittent heart burn for several months.  However, she reports that her pain has been worse over the past week.  She has tried taking Pepto Bismol, which she reports helps but then the pain returns.  She also reports that over the last few months she feels that food is getting stuck in the lower portion of her esophagus.  Dysphagia to solids, but not liquids.  She reports that her son had been diagnosed with  esophageal cancer and had been having similar symptoms  Patient is a 60 y.o. female presenting with abdominal pain. The history is provided by the patient.  Abdominal Pain The primary symptoms of the illness include abdominal pain and dysuria. The primary symptoms of the illness do not include fever, fatigue, shortness of breath, nausea, vomiting, diarrhea, hematemesis, vaginal discharge or vaginal bleeding.  The dysuria is not associated with hematuria, frequency or urgency.  The patient states that she believes she is currently not pregnant. The patient has not had a change in bowel habit. Additional symptoms associated with the illness include heartburn. Symptoms associated with the illness do not include chills, anorexia, diaphoresis, constipation, urgency, hematuria, frequency or back pain.    Past Medical History  Diagnosis Date  . Depression   . Seizures     Past Surgical History  Procedure Date  . Cerebral aneurysm repair   . Abdominal hysterectomy     Family History  Problem Relation Age of Onset  . Heart disease Mother   . Heart disease Father   . Cancer Brother   . Arthritis Brother     History  Substance Use Topics  . Smoking  status: Never Smoker   . Smokeless tobacco: Not on file  . Alcohol Use: No    OB History    Grav Para Term Preterm Abortions TAB SAB Ect Mult Living                  Review of Systems  Constitutional: Negative for fever, chills, diaphoresis, activity change, appetite change, fatigue and unexpected weight change.  HENT: Negative for neck pain and neck stiffness.   Respiratory: Negative for chest tightness, shortness of breath and wheezing.   Cardiovascular: Positive for chest pain.  Gastrointestinal: Positive for heartburn and abdominal pain. Negative for nausea, vomiting, diarrhea, constipation, anorexia and hematemesis.  Genitourinary: Positive for dysuria. Negative for urgency, frequency, hematuria, flank pain, vaginal bleeding, vaginal discharge and difficulty urinating.  Musculoskeletal: Negative for back pain.  Neurological: Negative for dizziness, syncope, weakness, light-headedness, numbness and headaches.  Psychiatric/Behavioral: Positive for sleep disturbance. Negative for suicidal ideas and dysphoric mood. The patient is nervous/anxious.     Allergies  Review of patient's allergies indicates no known allergies.  Home Medications   Current Outpatient Rx  Name Route Sig Dispense Refill  . CLONAZEPAM 1 MG PO TABS Oral Take 1 mg by mouth 2 (two) times daily as needed. For anxiety     . DOCUSATE SODIUM 100 MG PO CAPS Oral Take 100 mg by mouth every other day. For constipation     . DULOXETINE HCL 30 MG PO CPEP Oral Take 30 mg by mouth 2 (two) times daily. Take at 8am and  2pm     . ESTROGENS CONJUGATED 0.625 MG PO TABS Oral Take 0.625 mg by mouth every morning.     Marland Kitchen LEVETIRACETAM 500 MG PO TABS Oral Take 1 tablet (500 mg total) by mouth 2 (two) times daily. 60 tablet 3  . METOPROLOL SUCCINATE ER 25 MG PO TB24 Oral Take 25 mg by mouth daily.     Marland Kitchen TEMAZEPAM 30 MG PO CAPS Oral Take 30 mg by mouth at bedtime as needed. For sleep       BP 143/82  Pulse 77  Temp(Src) 97.8 F  (36.6 C) (Oral)  Resp 18  SpO2 95%  Physical Exam  Nursing note and vitals reviewed. Constitutional: She is oriented to person, place, and time. She appears well-developed and well-nourished. No distress.  HENT:  Head: Normocephalic and atraumatic.  Neck: Normal range of motion. Neck supple.  Cardiovascular: Normal rate, regular rhythm and normal heart sounds.   Pulmonary/Chest: Effort normal and breath sounds normal. No respiratory distress.  Abdominal: Soft. Bowel sounds are normal. She exhibits no distension and no mass. There is tenderness in the epigastric area. There is no rebound, no guarding and no CVA tenderness.  Musculoskeletal: Normal range of motion.  Neurological: She is alert and oriented to person, place, and time.  Skin: Skin is warm and dry. No rash noted. She is not diaphoretic. No erythema.  Psychiatric: Her speech is normal and behavior is normal. Her mood appears anxious. Cognition and memory are normal. She expresses no homicidal and no suicidal ideation. She expresses no suicidal plans and no homicidal plans.    ED Course  Procedures (including critical care time)   Labs Reviewed  CBC  DIFFERENTIAL  POCT I-STAT TROPONIN I  COMPREHENSIVE METABOLIC PANEL  LIPASE, BLOOD  I-STAT TROPONIN I   No results found.   No diagnosis found.   Date: 12/23/2010  Rate: 72  Rhythm: normal sinus rhythm  QRS Axis: normal  Intervals: normal  ST/T Wave abnormalities: normal  Conduction Disutrbances:none  Narrative Interpretation:   Old EKG Reviewed: unchanged    MDM  Epigastric pain and heart burn.  Negative cardiac workup.  Normal labs.  Therefore. Feel that symptoms are most likely GERD.  Discharged patient with PPI and follow up with PCP.        Pascal Lux [facility] 12/24/10 1717

## 2010-12-25 NOTE — ED Notes (Signed)
Per Pascal Lux Wingen PA, called patient and notify they have a UTI and call in RX Macrobid 100 mg PO BID x five days. Patient notified and Rx called to [facility] [phone]. Patient allergies were verified by patient.

## 2010-12-26 NOTE — ED Provider Notes (Signed)
Evaluation and management procedures were performed by the mid-level provider (PA/NP/CNM) under my supervision/collaboration. I was present and available during the ED course. Shylee Durrett Y.   Gavin Pound. Ashyah Quizon, MD 12/26/10 1415

## 2010-12-30 DIAGNOSIS — K219 Gastro-esophageal reflux disease without esophagitis: Secondary | ICD-10-CM | POA: Insufficient documentation

## 2010-12-30 DIAGNOSIS — F4321 Adjustment disorder with depressed mood: Secondary | ICD-10-CM | POA: Insufficient documentation

## 2010-12-30 DIAGNOSIS — Z634 Disappearance and death of family member: Secondary | ICD-10-CM | POA: Insufficient documentation

## 2011-01-05 ENCOUNTER — Observation Stay (HOSPITAL_COMMUNITY)
Admission: EM | Admit: 2011-01-05 | Discharge: 2011-01-08 | Disposition: A | Discharge status: Other Acute Inpatient Hospital | Payer: PRIVATE HEALTH INSURANCE | Attending: Family Medicine | Admitting: Family Medicine

## 2011-01-05 ENCOUNTER — Encounter (HOSPITAL_COMMUNITY): Payer: Self-pay | Admitting: *Deleted

## 2011-01-05 DIAGNOSIS — T424X4A Poisoning by benzodiazepines, undetermined, initial encounter: Principal | ICD-10-CM | POA: Insufficient documentation

## 2011-01-05 DIAGNOSIS — G5620 Lesion of ulnar nerve, unspecified upper limb: Secondary | ICD-10-CM

## 2011-01-05 DIAGNOSIS — G4700 Insomnia, unspecified: Secondary | ICD-10-CM

## 2011-01-05 DIAGNOSIS — R569 Unspecified convulsions: Secondary | ICD-10-CM

## 2011-01-05 DIAGNOSIS — T43591A Poisoning by other antipsychotics and neuroleptics, accidental (unintentional), initial encounter: Secondary | ICD-10-CM | POA: Insufficient documentation

## 2011-01-05 DIAGNOSIS — E871 Hypo-osmolality and hyponatremia: Secondary | ICD-10-CM

## 2011-01-05 DIAGNOSIS — I10 Essential (primary) hypertension: Secondary | ICD-10-CM

## 2011-01-05 DIAGNOSIS — N390 Urinary tract infection, site not specified: Secondary | ICD-10-CM

## 2011-01-05 DIAGNOSIS — T6592XA Toxic effect of unspecified substance, intentional self-harm, initial encounter: Secondary | ICD-10-CM | POA: Insufficient documentation

## 2011-01-05 DIAGNOSIS — R109 Unspecified abdominal pain: Secondary | ICD-10-CM | POA: Insufficient documentation

## 2011-01-05 DIAGNOSIS — T43502A Poisoning by unspecified antipsychotics and neuroleptics, intentional self-harm, initial encounter: Secondary | ICD-10-CM | POA: Insufficient documentation

## 2011-01-05 DIAGNOSIS — T510X4A Toxic effect of ethanol, undetermined, initial encounter: Secondary | ICD-10-CM | POA: Insufficient documentation

## 2011-01-05 DIAGNOSIS — T50902A Poisoning by unspecified drugs, medicaments and biological substances, intentional self-harm, initial encounter: Secondary | ICD-10-CM

## 2011-01-05 DIAGNOSIS — R45851 Suicidal ideations: Secondary | ICD-10-CM

## 2011-01-05 DIAGNOSIS — F3289 Other specified depressive episodes: Secondary | ICD-10-CM | POA: Insufficient documentation

## 2011-01-05 DIAGNOSIS — M1990 Unspecified osteoarthritis, unspecified site: Secondary | ICD-10-CM

## 2011-01-05 DIAGNOSIS — F329 Major depressive disorder, single episode, unspecified: Secondary | ICD-10-CM | POA: Insufficient documentation

## 2011-01-05 DIAGNOSIS — R51 Headache: Secondary | ICD-10-CM | POA: Insufficient documentation

## 2011-01-05 DIAGNOSIS — F32A Depression, unspecified: Secondary | ICD-10-CM

## 2011-01-05 DIAGNOSIS — R2681 Unsteadiness on feet: Secondary | ICD-10-CM

## 2011-01-05 HISTORY — DX: Essential (primary) hypertension: I10

## 2011-01-05 HISTORY — DX: Fibromyalgia: M79.7

## 2011-01-05 LAB — COMPREHENSIVE METABOLIC PANEL
AST: 39 U/L — ABNORMAL HIGH (ref 0–37)
Albumin: 3.6 g/dL (ref 3.5–5.2)
Calcium: 9.7 mg/dL (ref 8.4–10.5)
Chloride: 96 mEq/L (ref 96–112)
Creatinine, Ser: 0.62 mg/dL (ref 0.50–1.10)
Total Protein: 7.2 g/dL (ref 6.0–8.3)

## 2011-01-05 LAB — RAPID URINE DRUG SCREEN, HOSP PERFORMED
Amphetamines: NOT DETECTED
Benzodiazepines: POSITIVE — AB
Cocaine: NOT DETECTED
Opiates: NOT DETECTED
Tetrahydrocannabinol: NOT DETECTED

## 2011-01-05 LAB — DIFFERENTIAL
Basophils Absolute: 0.1 10*3/uL (ref 0.0–0.1)
Basophils Relative: 1 % (ref 0–1)
Eosinophils Absolute: 0.3 10*3/uL (ref 0.0–0.7)
Eosinophils Relative: 4 % (ref 0–5)
Monocytes Absolute: 0.8 10*3/uL (ref 0.1–1.0)
Neutro Abs: 4.1 10*3/uL (ref 1.7–7.7)

## 2011-01-05 LAB — APTT: aPTT: 26 seconds (ref 24–37)

## 2011-01-05 LAB — CBC
HCT: 40.5 % (ref 36.0–46.0)
MCHC: 34.3 g/dL (ref 30.0–36.0)
RDW: 12.6 % (ref 11.5–15.5)

## 2011-01-05 LAB — SALICYLATE LEVEL: Salicylate Lvl: <2 mg/dL — ABNORMAL LOW (ref 2.8–20.0)

## 2011-01-05 LAB — ACETAMINOPHEN LEVEL: Acetaminophen (Tylenol), Serum: <15 ug/mL (ref 10–30)

## 2011-01-05 NOTE — ED Notes (Signed)
Pt in c/o overdose, states she took xanax and temazepam, states she is unsure how much of either drug that she took, pt admits to SI, denies HI, also ETOH tonight, states she has had a bottle of wine

## 2011-01-05 NOTE — ED Provider Notes (Signed)
History     CSN: [id number] Arrival date & time: 01/05/2011 10:00 PM   Chief Complaint  Patient presents with  . Drug Overdose    HPI Pt was seen at 2215.  Per pt and friend, c/o gradual onset and worsening of persistent SI for the past several months.  Pt states she "tried to die so I can join my son" by OD on an unknown amount of xanax and tamazepam sometime between 1800 and 2100 PTA.  States she "drank some wine" also tonight.  Has been admitted to mental health several times for same.  Denies N/V/D, no CP/SOB, no abd pain.   Past Medical History  Diagnosis Date  . Depression   . Seizures   . Hypertension   . Fibromyalgia     Past Surgical History  Procedure Date  . Cerebral aneurysm repair   . Abdominal hysterectomy     Family History  Problem Relation Age of Onset  . Heart disease Mother   . Heart disease Father   . Cancer Brother   . Arthritis Brother     History  Substance Use Topics  . Smoking status: Never Smoker   . Smokeless tobacco: Not on file  . Alcohol Use: No   Review of Systems ROS: Statement: All systems negative except as marked or noted in the HPI; Constitutional: Negative for fever and chills. ; ; Eyes: Negative for eye pain, redness and discharge. ; ; ENMT: Negative for ear pain, hoarseness, nasal congestion, sinus pressure and sore throat. ; ; Cardiovascular: Negative for chest pain, palpitations, diaphoresis, dyspnea and peripheral edema. ; ; Respiratory: Negative for cough, wheezing and stridor. ; ; Gastrointestinal: Negative for nausea, vomiting, diarrhea, abdominal pain, blood in stool, hematemesis, jaundice and rectal bleeding. . ; ; Genitourinary: Negative for dysuria, flank pain and hematuria. ; ; Musculoskeletal: Negative for back pain and neck pain. Negative for swelling and trauma.; ; Skin: Negative for pruritus, rash, abrasions, blisters, bruising and skin lesion.; ; Neuro: Negative for headache, lightheadedness and neck stiffness. Negative  for weakness, altered level of consciousness , altered mental status, extremity weakness, paresthesias, involuntary movement, seizure and syncope.  Psych:  +SI, +SA, no HI, no hallucinations.   Allergies  Review of patient's allergies indicates no known allergies.  Home Medications   Current Outpatient Rx  Name Route Sig Dispense Refill  . ALPRAZOLAM 0.25 MG PO TABS Oral Take 0.25 mg by mouth at bedtime as needed. Anxiety     . DOCUSATE SODIUM 100 MG PO CAPS Oral Take 100 mg by mouth every other day. For constipation     . DULOXETINE HCL 30 MG PO CPEP Oral Take 30 mg by mouth 2 (two) times daily. Take at 8am and 2pm     . ESTROGENS CONJUGATED 0.625 MG PO TABS Oral Take 0.625 mg by mouth every morning.     Marland Kitchen LANSOPRAZOLE 30 MG PO CPDR Oral Take 1 capsule (30 mg total) by mouth daily. 30 capsule 0  . LEVETIRACETAM 500 MG PO TABS Oral Take 1 tablet (500 mg total) by mouth 2 (two) times daily. 60 tablet 3  . METOPROLOL SUCCINATE ER 25 MG PO TB24 Oral Take 25 mg by mouth daily.       BP 154/71  Pulse 85  Temp(Src) 98 F (36.7 C) (Oral)  Resp 18  SpO2 99%  Physical Exam 2220: Physical examination:  Nursing notes reviewed; Vital signs and O2 SAT reviewed;  Constitutional: Well developed, Well nourished, Well hydrated, In  no acute distress; Head:  Normocephalic, atraumatic; Eyes: EOMI, PERRL, No scleral icterus; ENMT: Mouth and pharynx normal, Mucous membranes moist; Neck: Supple, Full range of motion, No lymphadenopathy; Cardiovascular: Regular rate and rhythm, No murmur, rub, or gallop; Respiratory: Breath sounds clear & equal bilaterally, No rales, rhonchi, wheezes, or rub, Normal respiratory effort/excursion; Chest: Nontender, Movement normal; Abdomen: Soft, Nontender, Nondistended, Normal bowel sounds; Extremities: Pulses normal, No tenderness, No edema, No calf edema or asymmetry.; Neuro: AA&Ox3, Major CN grossly intact.  No gross focal motor or sensory deficits in extremities.; Skin:  Color normal, Warm, Dry; Psych:  Affect flat, agitated, +SI.   ED Course  Procedures   MDM  MDM Reviewed: nursing note and vitals Interpretation: labs   Results for orders placed during the hospital encounter of 01/05/11  ACETAMINOPHEN LEVEL      Component Value Range   Acetaminophen (Tylenol), Serum <15.0  10 - 30 (ug/mL)  SALICYLATE LEVEL      Component Value Range   Salicylate Lvl <2.0 (*) 2.8 - 20.0 (mg/dL)  PROTIME-INR      Component Value Range   Prothrombin Time 12.8  11.6 - 15.2 (seconds)   INR 0.94  0.00 - 1.49   APTT      Component Value Range   aPTT 26  24 - 37 (seconds)  CBC      Component Value Range   WBC 8.9  4.0 - 10.5 (K/uL)   RBC 4.49  3.87 - 5.11 (MIL/uL)   Hemoglobin 13.9  12.0 - 15.0 (g/dL)   HCT 11.9  14.7 - 82.9 (%)   MCV 90.2  78.0 - 100.0 (fL)   MCH 31.0  26.0 - 34.0 (pg)   MCHC 34.3  30.0 - 36.0 (g/dL)   RDW 56.2  13.0 - 86.5 (%)   Platelets 334  150 - 400 (K/uL)  DIFFERENTIAL      Component Value Range   Neutrophils Relative 46  43 - 77 (%)   Neutro Abs 4.1  1.7 - 7.7 (K/uL)   Lymphocytes Relative 41  12 - 46 (%)   Lymphs Abs 3.6  0.7 - 4.0 (K/uL)   Monocytes Relative 9  3 - 12 (%)   Monocytes Absolute 0.8  0.1 - 1.0 (K/uL)   Eosinophils Relative 4  0 - 5 (%)   Eosinophils Absolute 0.3  0.0 - 0.7 (K/uL)   Basophils Relative 1  0 - 1 (%)   Basophils Absolute 0.1  0.0 - 0.1 (K/uL)  COMPREHENSIVE METABOLIC PANEL      Component Value Range   Sodium 130 (*) 135 - 145 (mEq/L)   Potassium 4.6  3.5 - 5.1 (mEq/L)   Chloride 96  96 - 112 (mEq/L)   CO2 25  19 - 32 (mEq/L)   Glucose, Bld 85  70 - 99 (mg/dL)   BUN 12  6 - 23 (mg/dL)   Creatinine, Ser 7.84  0.50 - 1.10 (mg/dL)   Calcium 9.7  8.4 - 69.6 (mg/dL)   Total Protein 7.2  6.0 - 8.3 (g/dL)   Albumin 3.6  3.5 - 5.2 (g/dL)   AST 39 (*) 0 - 37 (U/L)   ALT 25  0 - 35 (U/L)   Alkaline Phosphatase 95  39 - 117 (U/L)   Total Bilirubin 0.1 (*) 0.3 - 1.2 (mg/dL)   GFR calc non Af Amer >90   >90 (mL/min)   GFR calc Af Amer >90  >90 (mL/min)  ETHANOL  Component Value Range   Alcohol, Ethyl (B) <11  0 - 11 (mg/dL)  URINE RAPID DRUG SCREEN (HOSP PERFORMED)      Component Value Range   Opiates NONE DETECTED  NONE DETECTED    Cocaine NONE DETECTED  NONE DETECTED    Benzodiazepines POSITIVE (*) NONE DETECTED    Amphetamines NONE DETECTED  NONE DETECTED    Tetrahydrocannabinol NONE DETECTED  NONE DETECTED    Barbiturates NONE DETECTED  NONE DETECTED      11:57 PM:  OD unk amount of benzodiazepines.  Begging ED staff to "just let me die."  Will need observation until at least 0300 before ACT eval, repeat labs, psych admit.   12:31 AM:  Sign out to Dr. Patrica Duel.       Diva Lemberger Allison Quarry, DO 01/06/11 1146

## 2011-01-05 NOTE — ED Notes (Signed)
Pt. Has active intent to harm self.  Reports she wants to go live with her deceased son.  Her son died in 06/23/2022 with esophageal cancer.  Pt. Reports he was her only child.  Pt. Took xanax, and tamazepam tonight along with drinking wine.

## 2011-01-06 ENCOUNTER — Encounter (HOSPITAL_COMMUNITY): Payer: Self-pay | Admitting: Family Medicine

## 2011-01-06 DIAGNOSIS — F329 Major depressive disorder, single episode, unspecified: Secondary | ICD-10-CM

## 2011-01-06 DIAGNOSIS — F3289 Other specified depressive episodes: Secondary | ICD-10-CM

## 2011-01-06 DIAGNOSIS — R45851 Suicidal ideations: Secondary | ICD-10-CM

## 2011-01-06 LAB — BASIC METABOLIC PANEL
CO2: 23 mEq/L (ref 19–32)
Calcium: 9.4 mg/dL (ref 8.4–10.5)
Chloride: 96 mEq/L (ref 96–112)
GFR calc non Af Amer: >90 mL/min (ref >90)
Potassium: 4 mEq/L (ref 3.5–5.1)

## 2011-01-06 MED ORDER — METOPROLOL TARTRATE 25 MG PO TABS
ORAL_TABLET | ORAL | Status: AC
  Administered 2011-01-06: 25 mg
  Filled 2011-01-06: qty 1

## 2011-01-06 MED ORDER — LEVETIRACETAM 500 MG PO TABS
500.0000 mg | ORAL_TABLET | Freq: Two times a day (BID) | ORAL | Status: DC
  Administered 2011-01-06 – 2011-01-08 (×5): 500 mg via ORAL
  Filled 2011-01-06 (×8): qty 1

## 2011-01-06 MED ORDER — IBUPROFEN 600 MG PO TABS
600.0000 mg | ORAL_TABLET | Freq: Three times a day (TID) | ORAL | Status: DC | PRN
  Administered 2011-01-06 – 2011-01-07 (×4): 600 mg via ORAL
  Filled 2011-01-06 (×3): qty 1
  Filled 2011-01-06 (×3): qty 3

## 2011-01-06 MED ORDER — DULOXETINE HCL 30 MG PO CPEP
30.0000 mg | ORAL_CAPSULE | Freq: Two times a day (BID) | ORAL | Status: DC
  Administered 2011-01-06 – 2011-01-08 (×5): 30 mg via ORAL
  Filled 2011-01-06 (×8): qty 1

## 2011-01-06 MED ORDER — ALUM & MAG HYDROXIDE-SIMETH 200-200-20 MG/5ML PO SUSP: 30.0000 mL | ORAL | Status: DC | PRN

## 2011-01-06 MED ORDER — ESTROGENS CONJUGATED 0.625 MG PO TABS
0.6250 mg | ORAL_TABLET | Freq: Every day | ORAL | Status: DC
  Administered 2011-01-07 – 2011-01-08 (×2): 0.625 mg via ORAL
  Filled 2011-01-06 (×3): qty 1

## 2011-01-06 MED ORDER — DOCUSATE SODIUM 100 MG PO CAPS
100.0000 mg | ORAL_CAPSULE | ORAL | Status: DC
  Administered 2011-01-06 – 2011-01-08 (×2): 100 mg via ORAL
  Filled 2011-01-06 (×2): qty 1

## 2011-01-06 MED ORDER — PANTOPRAZOLE SODIUM 40 MG PO TBEC
40.0000 mg | DELAYED_RELEASE_TABLET | Freq: Every day | ORAL | Status: DC
  Administered 2011-01-06 – 2011-01-08 (×2): 40 mg via ORAL
  Filled 2011-01-06 (×2): qty 1

## 2011-01-06 MED ORDER — TRAZODONE 25 MG HALF TABLET
25.0000 mg | ORAL_TABLET | Freq: Every evening | ORAL | Status: DC | PRN
  Administered 2011-01-07: 25 mg via ORAL
  Filled 2011-01-06: qty 1

## 2011-01-06 MED ORDER — ONDANSETRON HCL 4 MG PO TABS: 4.0000 mg | ORAL_TABLET | Freq: Three times a day (TID) | ORAL | Status: DC | PRN

## 2011-01-06 MED ORDER — ALPRAZOLAM 0.25 MG PO TABS
0.2500 mg | ORAL_TABLET | Freq: Three times a day (TID) | ORAL | Status: DC | PRN
  Administered 2011-01-06 – 2011-01-07 (×3): 0.25 mg via ORAL
  Filled 2011-01-06 (×3): qty 1

## 2011-01-06 MED ORDER — PANTOPRAZOLE SODIUM 40 MG PO TBEC
DELAYED_RELEASE_TABLET | ORAL | Status: AC
  Filled 2011-01-06: qty 1

## 2011-01-06 MED ORDER — IBUPROFEN 800 MG PO TABS
ORAL_TABLET | ORAL | Status: AC
  Administered 2011-01-06: 800 mg via ORAL
  Filled 2011-01-06: qty 1

## 2011-01-06 MED ORDER — METOPROLOL SUCCINATE ER 25 MG PO TB24
25.0000 mg | ORAL_TABLET | Freq: Every day | ORAL | Status: DC
  Administered 2011-01-07: 25 mg via ORAL
  Filled 2011-01-06 (×3): qty 1

## 2011-01-06 MED ORDER — IBUPROFEN 800 MG PO TABS
800.0000 mg | ORAL_TABLET | Freq: Once | ORAL | Status: AC
  Administered 2011-01-06: 800 mg via ORAL

## 2011-01-06 MED ORDER — TRAZODONE 25 MG HALF TABLET
25.0000 mg | ORAL_TABLET | Freq: Every evening | ORAL | Status: DC | PRN
  Filled 2011-01-06: qty 1

## 2011-01-06 MED ORDER — SODIUM CHLORIDE 0.9 % IV BOLUS (SEPSIS)
500.0000 mL | Freq: Once | INTRAVENOUS | Status: AC
  Administered 2011-01-06: 500 mL via INTRAVENOUS

## 2011-01-06 MED ORDER — SODIUM CHLORIDE 0.9 % IV SOLN
INTRAVENOUS | Status: DC
  Administered 2011-01-07 (×2): via INTRAVENOUS

## 2011-01-06 NOTE — ED Notes (Signed)
In to give pt ibuprofen for c/o headache. States that she wants to leave due to her BP meds not being ordered. States that she can leave on her own and wants to go. Talked with Dr Rubin Payor due to no orders other than Psych ED hold orders. Talked with [place]ka from ACT team stated that she was in an assessment and would call Dr. Rubin Payor.

## 2011-01-06 NOTE — ED Notes (Addendum)
Pt is moved now to room TCU 27 with sitter

## 2011-01-06 NOTE — ED Notes (Signed)
Pt is still dizzy and unsteady on her feet when standing

## 2011-01-06 NOTE — ED Notes (Signed)
Pt is been moved to psyh ed room 40

## 2011-01-06 NOTE — ED Provider Notes (Signed)
11:10 PM Patient was not on the behavioral health. Patient shift report. Acting, member was contacted. Calvin. Requested formal active. Evaluation. Unlikely that the patient. Will need a medical admission. Will follow. Recommendations from ACT TEAM.      Theron Arista A. Patrica Duel, MD 01/06/11 2312

## 2011-01-06 NOTE — Consult Note (Signed)
Patient Identification:  [name] Date of Evaluation:  01/06/2011   History of Present Illness:  Patient seen in assessment reviewed.  60 year old Caucasian female who overdosed on Klonopin and Xanax. She was also drinking wine simultaneously at the time. At this time patient is very guarded with constricted affect she is depressed and have suicidal ideations. She is redirectable during the interview not hallucinating or delusional. Patient's son died in late 06-17-2010. Half that she was living with a friend who told me that she is unable to take care of the patient at this time. As patient has unsteady gait and low sodium I spoke with the doctor pickering and also with the triad hospitalist to admit the patient on the medical floor.   Past Medical History:     Past Medical History  Diagnosis Date  . Depression   . Seizures   . Hypertension   . Fibromyalgia        Past Surgical History  Procedure Date  . Cerebral aneurysm repair   . Abdominal hysterectomy     Filed Vitals:   01/06/11 1126  BP: 135/65  Pulse: 75  Temp: 97.6 F (36.4 C)  Resp: 18    Lab Results:   BMET    Component Value Date/Time   NA 131* 01/06/2011 0405   K 4.0 01/06/2011 0405   CL 96 01/06/2011 0405   CO2 23 01/06/2011 0405   GLUCOSE 86 01/06/2011 0405   BUN 13 01/06/2011 0405   CREATININE 0.67 01/06/2011 0405   CREATININE 0.77 06/07/2010 0920   CALCIUM 9.4 01/06/2011 0405   GFRNONAA >90 01/06/2011 0405   GFRAA >90 01/06/2011 0405    Allergies: No Known Allergies  Current Medications:  Prior to Admission medications   Medication Sig Start Date End Date Taking? Authorizing Provider  ALPRAZolam (XANAX) 0.25 MG tablet Take 0.25 mg by mouth at bedtime as needed. Anxiety    Yes Historical Provider, MD  docusate sodium (COLACE) 100 MG capsule Take 100 mg by mouth every other day. For constipation    Yes Historical Provider, MD  DULoxetine (CYMBALTA) 30 MG capsule Take 30 mg by mouth 2 (two)  times daily. Take at 8am and 2pm  11/28/10  Yes Pandora Leiter  estrogens, conjugated, (PREMARIN) 0.625 MG tablet Take 0.625 mg by mouth every morning.    Yes Historical Provider, MD  lansoprazole (PREVACID) 30 MG capsule Take 1 capsule (30 mg total) by mouth daily. 12/23/10 12/23/11 Yes Heather Zenaida Niece Wingen  levETIRAcetam (KEPPRA) 500 MG tablet Take 1 tablet (500 mg total) by mouth 2 (two) times daily. 06/02/10  Yes Ellery Plunk  metoprolol succinate (TOPROL-XL) 25 MG 24 hr tablet Take 25 mg by mouth daily.    Yes Historical Provider, MD    Social History:    reports that she has never smoked. She does not have any smokeless tobacco history on file. She reports that she does not drink alcohol or use illicit drugs.   Family History:    Family History  Problem Relation Age of Onset  . Heart disease Mother   . Heart disease Father   . Cancer Brother   . Arthritis Brother      DIAGNOSIS:   AXIS I  Maj. depressive disorder recurrent type   AXIS II  Deffered  AXIS III See medical notes.  AXIS IV  recent death of son   AXIS V 30     Recommendations:  Continue Cymbalta 30 mg by mouth daily twice a  day. Once patient is medically cleared patient can be of referred to  [facility] or [facility] for [place] psych unit.   Eulogio Ditch, MD

## 2011-01-06 NOTE — Progress Notes (Signed)
Dr Mahala Menghini contacted for admission order clarification TCU RN inquired about order for bed placement

## 2011-01-06 NOTE — ED Notes (Signed)
PT C/O FEELING AGITATED AND HEADACHE.

## 2011-01-06 NOTE — ED Notes (Signed)
Friend of patient in the dept, states that patient was dumped on her and she has taken care of her for 1 year. States that she can not take care of her and once she leaves the patient can not return to her house. Informed Lily Kocher, she states that the patient will be seen momentarily

## 2011-01-06 NOTE — ED Notes (Addendum)
Pt arrived to the unit via WC pt unsteady on her feet requiring assistance to ambulate pt also incontinent of urine scrub pants changed called [facility] Rn reported that pt needs to be able to perform her own ADL's here in the psych ed pt transported back to room 7

## 2011-01-06 NOTE — BH Assessment (Signed)
Information for Social Worker on Med Unit/Potential discharge information regarding patients care:  Pt's friend whom is also the person she has resided with x1 yr is at pt's bedside today. Pt agreed and consented for friend to be a part of her treatment. The friend sts that she is concerned of pt's well being, future living arrangements, and ability to care for her self. She is requesting that Social Work assist with making pt alternative living arrangements at the time of discharge from the medical floor (assisted living and group homes were suggested).  Melynda Ripple, MS (Assessment Counselor)

## 2011-01-06 NOTE — BH Assessment (Signed)
Pt was assessed 01/06/2011 approx. 0981 due to a suicide attempt by overdose. Patient referred to [facility]. In shift report previous staff stated that pt was declined by [facility] b/c pt was not medically cleared. Later during today's shift patient was re-evaluated by medical staff (Dr. Pickering/Dr. Rogers Blocker). Pt's disposition was later decided to be a medical admit. Pt was informed of her disposition by staff at [facility].

## 2011-01-06 NOTE — H&P (Signed)
CC: OD and Hyponatremia  HPI:  60 yr old Cf presented to ED yesterday with Od on Xanax and temazepam and some wine-unclear as to how much she took.  Wished to Die she states.  Still does feel this way--lost son in May, and cannot seem to get over the loss-this was her only child, had been suffering from esophageal cancer , which he had been suffering with for about 10 months.  Feels hopeless-has a mother in [place].  Stays with a friend here in [place].  Has b een staying with friend for about 1 year.   This is not the first time she has tried to kill herself-cut her arm in the past and OD ASA in the past. Has had depression all her life-has been on med's in the past-cannot recall last time seen by psychiatrist  No CP/N/v/SOB/Blurred vision/Double vision Has some Abd discomfort-like GERd  No overt CP, no sz, syncope, weakness   Flat affect, no ict/pallor CTA B S1 S2 no M/R/G-tele nsr Abd soft nt/nd Neuro I-alert and oriented x 3 No LE swelling/edema.  Results for orders placed during the hospital encounter of 01/05/11 (from the past 72 hour(s))  ACETAMINOPHEN LEVEL     Status: Normal   Collection Time   01/05/11 10:19 PM      Component Value Range Comment   Acetaminophen (Tylenol), Serum <15.0  10 - 30 (ug/mL)   SALICYLATE LEVEL     Status: Abnormal   Collection Time   01/05/11 10:19 PM      Component Value Range Comment   Salicylate Lvl <2.0 (*) 2.8 - 20.0 (mg/dL)   PROTIME-INR     Status: Normal   Collection Time   01/05/11 10:19 PM      Component Value Range Comment   Prothrombin Time 12.8  11.6 - 15.2 (seconds)    INR 0.94  0.00 - 1.49    APTT     Status: Normal   Collection Time   01/05/11 10:19 PM      Component Value Range Comment   aPTT 26  24 - 37 (seconds)   CBC     Status: Normal   Collection Time   01/05/11 10:19 PM      Component Value Range Comment   WBC 8.9  4.0 - 10.5 (K/uL)    RBC 4.49  3.87 - 5.11 (MIL/uL)    Hemoglobin 13.9  12.0 - 15.0 (g/dL)      HCT 16.1  09.6 - 46.0 (%)    MCV 90.2  78.0 - 100.0 (fL)    MCH 31.0  26.0 - 34.0 (pg)    MCHC 34.3  30.0 - 36.0 (g/dL)    RDW 04.5  40.9 - 81.1 (%)    Platelets 334  150 - 400 (K/uL)   DIFFERENTIAL     Status: Normal   Collection Time   01/05/11 10:19 PM      Component Value Range Comment   Neutrophils Relative 46  43 - 77 (%)    Neutro Abs 4.1  1.7 - 7.7 (K/uL)    Lymphocytes Relative 41  12 - 46 (%)    Lymphs Abs 3.6  0.7 - 4.0 (K/uL)    Monocytes Relative 9  3 - 12 (%)    Monocytes Absolute 0.8  0.1 - 1.0 (K/uL)    Eosinophils Relative 4  0 - 5 (%)    Eosinophils Absolute 0.3  0.0 - 0.7 (K/uL)    Basophils Relative 1  0 - 1 (%)    Basophils Absolute 0.1  0.0 - 0.1 (K/uL)   COMPREHENSIVE METABOLIC PANEL     Status: Abnormal   Collection Time   01/05/11 10:19 PM      Component Value Range Comment   Sodium 130 (*) 135 - 145 (mEq/L)    Potassium 4.6  3.5 - 5.1 (mEq/L) SLIGHT HEMOLYSIS   Chloride 96  96 - 112 (mEq/L)    CO2 25  19 - 32 (mEq/L)    Glucose, Bld 85  70 - 99 (mg/dL)    BUN 12  6 - 23 (mg/dL)    Creatinine, Ser 4.09  0.50 - 1.10 (mg/dL)    Calcium 9.7  8.4 - 10.5 (mg/dL)    Total Protein 7.2  6.0 - 8.3 (g/dL)    Albumin 3.6  3.5 - 5.2 (g/dL)    AST 39 (*) 0 - 37 (U/L) SLIGHT HEMOLYSIS   ALT 25  0 - 35 (U/L)    Alkaline Phosphatase 95  39 - 117 (U/L)    Total Bilirubin 0.1 (*) 0.3 - 1.2 (mg/dL)    GFR calc non Af Amer >90  >90 (mL/min)    GFR calc Af Amer >90  >90 (mL/min)   ETHANOL     Status: Normal   Collection Time   01/05/11 10:19 PM      Component Value Range Comment   Alcohol, Ethyl (B) <11  0 - 11 (mg/dL)   URINE RAPID DRUG SCREEN (HOSP PERFORMED)     Status: Abnormal   Collection Time   01/05/11 10:43 PM      Component Value Range Comment   Opiates NONE DETECTED  NONE DETECTED     Cocaine NONE DETECTED  NONE DETECTED     Benzodiazepines POSITIVE (*) NONE DETECTED     Amphetamines NONE DETECTED  NONE DETECTED     Tetrahydrocannabinol NONE  DETECTED  NONE DETECTED     Barbiturates NONE DETECTED  NONE DETECTED    ACETAMINOPHEN LEVEL     Status: Normal   Collection Time   01/06/11  4:05 AM      Component Value Range Comment   Acetaminophen (Tylenol), Serum <15.0  10 - 30 (ug/mL)   BASIC METABOLIC PANEL     Status: Abnormal   Collection Time   01/06/11  4:05 AM      Component Value Range Comment   Sodium 131 (*) 135 - 145 (mEq/L)    Potassium 4.0  3.5 - 5.1 (mEq/L)    Chloride 96  96 - 112 (mEq/L)    CO2 23  19 - 32 (mEq/L)    Glucose, Bld 86  70 - 99 (mg/dL)    BUN 13  6 - 23 (mg/dL)    Creatinine, Ser 8.11  0.50 - 1.10 (mg/dL)    Calcium 9.4  8.4 - 10.5 (mg/dL)    GFR calc non Af Amer >90  >90 (mL/min)    GFR calc Af Amer >90  >90 (mL/min)    A/p Obs admit for hyponatremia--Labs in 11/24/10 showed sodium 131--rpt sodium in am Blood pressure slightly elevated-rpt- defer to pscyh re: mood stabilizing meds-?benefit from electro-convulsive therapy Will coordinate with psych for possible placement

## 2011-01-06 NOTE — BH Assessment (Signed)
Assessment Note   [name] is an 60 y.o. female.  [name] attempted to take her own life tonight by overdosing on clonazepam and xanax and drinking them down with a glass of wine.  [name] is unsure of how much she took of the medications.  She reports that she drinks 1 glass of wine usually when out at a restaurant or socially.  She said that this may be 2-3 times per week.  [name] is actively suicidal and states that she no longer wishes to live since her son died in 06-12-10.  Mother said son died of esophageal cancer.  She has one grandson but said that she does not have a relationship with him since he lives in [place].  She is not concerned enough with grandson for him to be a deterrent to suicide.  She has no deterrents to killing herself.  [name] said that she thinks about her son all the time and living in [place] where she raised him reminds her of him also.  She denies any HI or A/V hallucinations at this time.  [name] is unable to contract for safety and is in need of inpatient psychiatric care.  [name] said that she would go to [facility] but she did not want to go anywhere else.  If she is declined at [facility] then she will need IVC paperwork filed. Axis I: Major Depression, Recurrent severe Axis II: Deferred Axis III:  Past Medical History  Diagnosis Date  . Depression   . Seizures   . Hypertension   . Fibromyalgia    Axis IV: other psychosocial or environmental problems Axis V: 21-30 behavior considerably influenced by delusions or hallucinations OR serious impairment in judgment, communication OR inability to function in almost all areas  Past Medical History:  Past Medical History  Diagnosis Date  . Depression   . Seizures   . Hypertension   . Fibromyalgia     Past Surgical History  Procedure Date  . Cerebral aneurysm repair   . Abdominal hysterectomy     Family History:  Family History  Problem Relation Age of Onset  . Heart disease Mother   . Heart disease Father   .  Cancer Brother   . Arthritis Brother     Social History:  reports that she has never smoked. She does not have any smokeless tobacco history on file. She reports that she does not drink alcohol or use illicit drugs.  Additional Social History:  Alcohol / Drug Use Pain Medications: None Prescriptions: see chart Over the Counter: None History of alcohol / drug use?: Yes Substance #1 Name of Substance 1: ETOH (wine) 1 - Age of First Use: Unknown 1 - Amount (size/oz): One glass 1 - Frequency: Every 2-3 days or socailly 1 - Duration: On-going 1 - Last Use / Amount: 01/05/11 One glass Allergies: No Known Allergies  Home Medications:  Medications Prior to Admission  Medication Dose Route Frequency Provider Last Rate Last Dose  . ibuprofen (ADVIL,MOTRIN) tablet 800 mg  800 mg Oral Once Peter A. Patrica Duel, MD   800 mg at 01/06/11 0329   Medications Prior to Admission  Medication Sig Dispense Refill  . docusate sodium (COLACE) 100 MG capsule Take 100 mg by mouth every other day. For constipation       . DULoxetine (CYMBALTA) 30 MG capsule Take 30 mg by mouth 2 (two) times daily. Take at 8am and 2pm       . estrogens, conjugated, (PREMARIN) 0.625 MG  tablet Take 0.625 mg by mouth every morning.       . lansoprazole (PREVACID) 30 MG capsule Take 1 capsule (30 mg total) by mouth daily.  30 capsule  0  . levETIRAcetam (KEPPRA) 500 MG tablet Take 1 tablet (500 mg total) by mouth 2 (two) times daily.  60 tablet  3  . metoprolol succinate (TOPROL-XL) 25 MG 24 hr tablet Take 25 mg by mouth daily.         OB/GYN Status:  No LMP recorded. Patient has had a hysterectomy.  General Assessment Data Location of Assessment: [facility] ED ACT Assessment: Yes Living Arrangements: Non-Relatives Can pt return to current living arrangement?: Yes Admission Status: Voluntary Is patient capable of signing voluntary admission?: Yes Transfer from: Acute Hospital Referral Source: Self/Family/Friend     Risk to  self Suicidal Ideation: Yes-Currently Present Suicidal Intent: Yes-Currently Present Is patient at risk for suicide?: Yes Suicidal Plan?: Yes-Currently Present Specify Current Suicidal Plan:  (Plan to overdose on medications.) Access to Means: Yes Specify Access to Suicidal Means: OTC and prescriptions What has been your use of drugs/alcohol within the last 12 months?: Patient reports drinking 1 glass of wine q 2-3 days Previous Attempts/Gestures: Yes How many times?:  (Once many years ago) Other Self Harm Risks: None Triggers for Past Attempts: None known Intentional Self Injurious Behavior: None Family Suicide History: No Recent stressful life event(s): Loss (Comment) (Only son died of cancer in 07-01-10.) Persecutory voices/beliefs?: No Depression: Yes Depression Symptoms: Despondent;Insomnia;Isolating;Loss of interest in usual pleasures;Feeling worthless/self pity Substance abuse history and/or treatment for substance abuse?: Yes Suicide prevention information given to non-admitted patients: Not applicable  Risk to Others Homicidal Ideation: No Thoughts of Harm to Others: No Current Homicidal Intent: No Current Homicidal Plan: No Access to Homicidal Means: No Identified Victim:  (No one) History of harm to others?: No Assessment of Violence: None Noted Violent Behavior Description:  (Patient cooperative but irritable) Does patient have access to weapons?: No Criminal Charges Pending?: No Does patient have a court date: No  Psychosis Hallucinations: None noted Delusions: None noted  Mental Status Report Appear/Hygiene:  (Casual) Eye Contact: Fair Motor Activity: Unremarkable Speech: Logical/coherent Level of Consciousness: Drowsy Mood: Depressed;Sad Affect: Depressed;Irritable Anxiety Level: Moderate Thought Processes: Coherent;Relevant Judgement: Impaired Orientation: Person;Place;Time;Situation Obsessive Compulsive Thoughts/Behaviors: None  Cognitive  Functioning Concentration: Decreased Memory: Recent Impaired;Remote Intact IQ: Average Insight: Good Impulse Control: Poor Appetite: Fair Weight Loss:  (Unknown) Weight Gain:  (Unknown) Sleep: Decreased Total Hours of Sleep:  (<4H/D) Vegetative Symptoms: None  Prior Inpatient Therapy Prior Inpatient Therapy: Yes Prior Therapy Dates: June 2012 Prior Therapy Facilty/Provider(s):  [facility]) Reason for Treatment:  (Grief over loss of only son)  Prior Outpatient Therapy Prior Outpatient Therapy: Yes Prior Therapy Dates:  (June to now) Prior Therapy Facilty/Provider(s):  [facility]) Reason for Treatment:  (Depression)  ADL Screening (condition at time of admission) Patient's cognitive ability adequate to safely complete daily activities?: Yes Patient able to express need for assistance with ADLs?: Yes Independently performs ADLs?: Yes Weakness of Legs: None Weakness of Arms/Hands: None  Home Assistive Devices/Equipment Home Assistive Devices/Equipment: None    Abuse/Neglect Assessment (Assessment to be complete while patient is alone) Physical Abuse: Denies Verbal Abuse: Denies Sexual Abuse: Denies Exploitation of patient/patient's resources: Denies Self-Neglect: Denies Values / Beliefs Cultural Requests During Hospitalization: None Spiritual Requests During Hospitalization: None        Additional Information 1:1 In Past 12 Months?: No CIRT Risk: No Elopement Risk: No Does patient  have medical clearance?: Yes     Disposition:  Disposition Disposition of Patient: Inpatient treatment program Type of inpatient treatment program: Adult (Referred to [facility])  On Site Evaluation by:   Reviewed with Physician:     Beatriz Stallion Ray 01/06/2011 4:20 AM

## 2011-01-06 NOTE — ED Notes (Signed)
Report called to [facility], RN--Pt. Transported to Psy Room 40 via wheelchair.

## 2011-01-06 NOTE — ED Provider Notes (Addendum)
  Physical Exam  BP 127/68  Pulse 83  Temp(Src) 97.5 F (36.4 C) (Oral)  Resp 20  SpO2 100%  Physical Exam  ED Course  Procedures  MDM Patient reportedly has been complainingf. She was suicidal has not been cleared. Her labs reviewed and showed a mild hyponatremia at 130. It will be rechecked.      Juliet Rude. Rubin Payor, MD 01/06/11 0858  Recheck of Na was 131. D/w Dr Aris Lot. He recommended medine admid to Med/psych floor.  Juliet Rude. Rubin Payor, MD 01/06/11 1011

## 2011-01-07 DIAGNOSIS — I10 Essential (primary) hypertension: Secondary | ICD-10-CM | POA: Diagnosis present

## 2011-01-07 LAB — BASIC METABOLIC PANEL
BUN: 14 mg/dL (ref 6–23)
Creatinine, Ser: 0.71 mg/dL (ref 0.50–1.10)
GFR calc Af Amer: >90 mL/min (ref >90)
GFR calc non Af Amer: >90 mL/min (ref >90)
Potassium: 4.2 mEq/L (ref 3.5–5.1)

## 2011-01-07 MED ORDER — METOPROLOL SUCCINATE ER 50 MG PO TB24
50.0000 mg | ORAL_TABLET | Freq: Every day | ORAL | Status: DC
  Administered 2011-01-08: 50 mg via ORAL
  Filled 2011-01-07 (×2): qty 1

## 2011-01-07 MED ORDER — ALPRAZOLAM 0.5 MG PO TABS
0.5000 mg | ORAL_TABLET | Freq: Three times a day (TID) | ORAL | Status: DC | PRN
  Administered 2011-01-07 – 2011-01-08 (×2): 0.5 mg via ORAL
  Filled 2011-01-07 (×2): qty 1

## 2011-01-07 NOTE — Progress Notes (Signed)
   [facility] HOSPITALIST progress note    Interval h/o:- 60 yr cf presents with OD xanax/temazepam, hyponatremia and htn   Subjective: Has a headache.  States made better by Ativan only. No other c/o   Objective: Vital signs in last 24 hours: Temp:  [97.6 F (36.4 C)-98 F (36.7 C)] 98 F (36.7 C) (12/15 1411) Pulse Rate:  [75-95] 95  (12/15 1411) Resp:  [16-18] 16  (12/15 1411) BP: (126-167)/(73-84) 167/84 mmHg (12/15 1411) SpO2:  [92 %-100 %] 94 % (12/15 1411) Weight:  [70.308 kg (155 lb)] 155 lb (70.308 kg) (12/15 0410) Weight change:   Intake/Output Summary (Last 24 hours) at 01/07/11 1700 Last data filed at 01/07/11 1412  Gross per 24 hour  Intake   1650 ml  Output      4 ml  Net   1646 ml    BP 167/84  Pulse 95  Temp(Src) 98 F (36.7 C) (Oral)  Resp 16  Ht 5\' 6"  (1.676 m)  Wt 70.308 kg (155 lb)  BMI 25.02 kg/m2  SpO2 94% Flat affect, no ict/pallor  CTA B  S1 S2 no M/R/G-tele nsr  Abd soft nt/nd  Neuro I-alert and oriented x 3  No LE swelling/edema.   Lab Results:  [facility] 01/07/11 0550 01/06/11 0405  NA 135 131*  K 4.2 4.0  CL 101 96  CO2 26 23  GLUCOSE 84 86  BUN 14 13  CREATININE 0.71 0.67  CALCIUM 9.2 9.4  MG -- --  PHOS -- --    Basename 01/05/11 2219  AST 39*  ALT 25  ALKPHOS 95  BILITOT 0.1*  PROT 7.2  ALBUMIN 3.6     Basename 01/05/11 2219  WBC 8.9  NEUTROABS 4.1  HGB 13.9  HCT 40.5  MCV 90.2  PLT 334   Medications: I have reviewed the patient's current medications. Scheduled Meds:   . docusate sodium  100 mg Oral QODAY  . DULoxetine  30 mg Oral BID  . estrogens (conjugated)  0.625 mg Oral QAC breakfast  . levETIRAcetam  500 mg Oral BID  . metoprolol succinate  25 mg Oral Daily  . pantoprazole  40 mg Oral Daily   Continuous Infusions:   . sodium chloride 125 mL/hr at 01/07/11 1559   PRN Meds:.ALPRAZolam, alum & mag hydroxide-simeth, ibuprofen, ondansetron, traZODone, DISCONTD:  traZODone   Assessment/Plan: Patient Active Hospital Problem List: Suicidal thoughts (01/06/2011)   Assessment: increase ativan from 0.25-->0.5 Continue Cymbalta 30 mg. Cont trazadone for sleep  Depression (06/03/2010)   Assessment: see above  Seizure (06/03/2010)   Assessment: Continue Keppra 500 bid  Hyponatremia (11/28/2010)   Assessment: Resolved-Sodium now 135-d/c IVF and reassess labs am    Hypertension (01/07/2011)   Assessment: Increased Metoprolol to 50XL qd   Dispo per Psych-Needs placement   LOS: 2 days   [facility] 01/07/2011, 5:00 PM

## 2011-01-07 NOTE — ED Notes (Signed)
Information faxed to [place], [facility] and [facility] hospital for placement.

## 2011-01-08 MED ORDER — ALPRAZOLAM 0.5 MG PO TABS: 0.5000 mg | ORAL_TABLET | Freq: Three times a day (TID) | ORAL | Status: AC | PRN

## 2011-01-08 NOTE — Discharge Summary (Signed)
Physician Discharge Summary  Patient ID: [name] MRN: [id number] DOB/AGE: 1950-05-07 60 y.o.  Admit date: 01/05/2011 Discharge date: 01/08/2011  Admission Diagnoses: Hyponatremia, Suicidal intent  Discharge Diagnoses:  Principal Problem:  *Suicidal thoughts Active Problems:  Depression  Seizure  Hyponatremia  Hypertension   Discharged Condition: good  Hospital Course: 60 yr old Cf presented to ED 12.13 with Od on unspecified amount Xanax and temazepam and some wine-unclear as to how much she took.  Wished to Die she stated. Still does feel this way--lost son in May, and cannot seem to get over the loss-this was her only child, had been suffering from esophageal cancer , which he had been suffering with for about 10 months. Feels hopeless-has a mother in [place].  Was hyponatremic on admission which resolved with IVF repletion. Hypertension was moderately controlled while in hospital. Psychiatry saw patient, opinion was needed inpatient stay to manage her care, given this was first major attempt   Consults: psychiatry  Significant Diagnostic Studies: labs: Hyponatremia  Treatments: IV hydration and cardiac meds: metoprolol  Discharge Exam: Blood pressure 133/71, pulse 82, temperature 98.2 F (36.8 C), temperature source Oral, resp. rate 18, height 5\' 6"  (1.676 m), weight 70.308 kg (155 lb), SpO2 96.00%. General appearance: alert, cooperative and appears stated age Throat: lips, mucosa, and tongue normal; teeth and gums normal Resp: clear to auscultation bilaterally and normal percussion bilaterally Cardio: regular rate and rhythm, S1, S2 normal, no murmur, click, rub or gallop Extremities: extremities normal, atraumatic, no cyanosis or edema Pulses: 2+ and symmetric Neurologic: Grossly normal  Disposition: Home or Self Care  Discharge Orders    Future Orders Please Complete By Expires   Diet - low sodium heart healthy      Increase activity slowly      Call MD  for:  persistant nausea and vomiting      Call MD for:  difficulty breathing, headache or visual disturbances        Current Discharge Medication List    CONTINUE these medications which have CHANGED   Details  ALPRAZolam (XANAX) 0.5 MG tablet Take 1 tablet (0.5 mg total) by mouth 3 (three) times daily as needed for anxiety. Qty: 12 tablet, Refills: 0      CONTINUE these medications which have NOT CHANGED   Details  docusate sodium (COLACE) 100 MG capsule Take 100 mg by mouth every other day. For constipation     DULoxetine (CYMBALTA) 30 MG capsule Take 30 mg by mouth 2 (two) times daily. Take at 8am and 2pm     estrogens, conjugated, (PREMARIN) 0.625 MG tablet Take 0.625 mg by mouth every morning.     lansoprazole (PREVACID) 30 MG capsule Take 1 capsule (30 mg total) by mouth daily. Qty: 30 capsule, Refills: 0    levETIRAcetam (KEPPRA) 500 MG tablet Take 1 tablet (500 mg total) by mouth 2 (two) times daily. Qty: 60 tablet, Refills: 3    metoprolol succinate (TOPROL-XL) 25 MG 24 hr tablet Take 25 mg by mouth daily.        Follow-up Information    Follow up with [facility]. Make an appointment in 1 month. (As needed)          Signed: Durelle Zepeda,JAI 01/08/2011, 11:21 AM

## 2011-01-08 NOTE — Progress Notes (Signed)
Patient was referred for voluntary commitment and accepted by [facility]. Patient then refused to go for treatment but was exhibiting bizarre behaviors- talking to the TV and not being able to focus.  Involuntary commitment papers initiated. Notified [place], Therapist, occupational, patient's nurse. Awaiting transportation. Notified Nursing of process to complete once process is complete.  Darylene Price, BSW, 01/08/2011 4:02 PM

## 2011-02-14 DIAGNOSIS — Z79899 Other long term (current) drug therapy: Secondary | ICD-10-CM | POA: Insufficient documentation

## 2011-02-14 DIAGNOSIS — M797 Fibromyalgia: Secondary | ICD-10-CM | POA: Insufficient documentation

## 2011-02-15 DIAGNOSIS — E559 Vitamin D deficiency, unspecified: Secondary | ICD-10-CM | POA: Insufficient documentation

## 2011-04-02 ENCOUNTER — Encounter (HOSPITAL_COMMUNITY): Payer: Self-pay | Admitting: Emergency Medicine

## 2011-04-02 ENCOUNTER — Emergency Department (HOSPITAL_COMMUNITY)
Admission: EM | Admit: 2011-04-02 | Discharge: 2011-04-02 | Disposition: A | Discharge status: Home / Self Care | Payer: Medicare Other | Attending: Emergency Medicine | Admitting: Emergency Medicine

## 2011-04-02 DIAGNOSIS — G4700 Insomnia, unspecified: Secondary | ICD-10-CM | POA: Insufficient documentation

## 2011-04-02 DIAGNOSIS — F3289 Other specified depressive episodes: Secondary | ICD-10-CM | POA: Insufficient documentation

## 2011-04-02 DIAGNOSIS — I10 Essential (primary) hypertension: Secondary | ICD-10-CM | POA: Insufficient documentation

## 2011-04-02 DIAGNOSIS — F329 Major depressive disorder, single episode, unspecified: Secondary | ICD-10-CM | POA: Insufficient documentation

## 2011-04-02 HISTORY — DX: Insomnia, unspecified: G47.00

## 2011-04-02 MED ORDER — HYDROXYZINE HCL 50 MG PO TABS: 50.0000 mg | ORAL_TABLET | Freq: Every evening | ORAL | Status: AC | PRN

## 2011-04-02 NOTE — ED Provider Notes (Signed)
History     CSN: [id number]  Arrival date & time 04/02/11  1518   First MD Initiated Contact with Patient 04/02/11 1626      Chief Complaint  Patient presents with  . Insomnia    (Consider location/radiation/quality/duration/timing/severity/associated sxs/prior treatment) HPI Comments: Patient presents with complaint of insomnia. Patient states that she is on long-term diazepam for this. She states that she is currently out of her medication, is new to the area, and does not have a primary care physician. She is requesting refill of 30 mg temazepam tablets. She denies other medical complaints. [place] substance supporting data base shows that the patient has been receiving 30 mg capsules of temazepam in [place] area for the past 6 months. She had 50 tablets filled in February alone. When asked about this the patient admits to having seen Dr. Daphine Deutscher one time but denies having 50 tablets filled.   The history is provided by the patient.    Past Medical History  Diagnosis Date  . Depression   . Seizures   . Hypertension   . Fibromyalgia   . Insomnia     Past Surgical History  Procedure Date  . Cerebral aneurysm repair   . Abdominal hysterectomy     Family History  Problem Relation Age of Onset  . Heart disease Mother   . Heart disease Father   . Cancer Brother   . Arthritis Brother     History  Substance Use Topics  . Smoking status: Never Smoker   . Smokeless tobacco: Not on file  . Alcohol Use: No    OB History    Grav Para Term Preterm Abortions TAB SAB Ect Mult Living                  Review of Systems  Constitutional: Negative for fever.  HENT: Negative for sore throat and rhinorrhea.   Respiratory: Negative for cough.   Cardiovascular: Negative for chest pain.  Gastrointestinal: Negative for nausea, vomiting, abdominal pain and diarrhea.  Musculoskeletal: Negative for myalgias.  Skin: Negative for rash.  Neurological: Negative for  headaches.  Psychiatric/Behavioral: Positive for sleep disturbance.    Allergies  Review of patient's allergies indicates no known allergies.  Home Medications   Current Outpatient Rx  Name Route Sig Dispense Refill  . DOCUSATE SODIUM 100 MG PO CAPS Oral Take 100 mg by mouth daily as needed. For constipation     . DULOXETINE HCL 30 MG PO CPEP Oral Take 30 mg by mouth 2 (two) times daily. Take at 8am and 2pm     . ESTROGENS CONJUGATED 0.625 MG PO TABS Oral Take 0.625 mg by mouth daily.     . IBUPROFEN 200 MG PO TABS Oral Take 400 mg by mouth every 6 (six) hours as needed. For pain    . LANSOPRAZOLE 30 MG PO CPDR Oral Take 30 mg by mouth daily as needed.    Marland Kitchen LEVETIRACETAM 500 MG PO TABS Oral Take 1 tablet (500 mg total) by mouth 2 (two) times daily. 60 tablet 3  . LOSARTAN POTASSIUM 50 MG PO TABS Oral Take 50 mg by mouth daily.    Marland Kitchen METOPROLOL SUCCINATE ER 25 MG PO TB24 Oral Take 25 mg by mouth daily.     Marland Kitchen TEMAZEPAM 30 MG PO CAPS Oral Take 30 mg by mouth at bedtime as needed. For sleep    . VITAMIN D (ERGOCALCIFEROL) 50000 UNITS PO CAPS Oral Take 50,000 Units by mouth every 7 (  seven) days. On monday      BP 147/80  Pulse 87  Temp(Src) 97.6 F (36.4 C) (Oral)  Resp 20  SpO2 96%  Physical Exam  Nursing note and vitals reviewed. Constitutional: She is oriented to person, place, and time. She appears well-developed and well-nourished.  HENT:  Head: Normocephalic and atraumatic.  Eyes: Conjunctivae are normal.  Neck: Normal range of motion. Neck supple.  Pulmonary/Chest: No respiratory distress.  Neurological: She is alert and oriented to person, place, and time.  Skin: Skin is warm and dry.  Psychiatric: She has a normal mood and affect.    ED Course  Procedures (including critical care time)  Labs Reviewed - No data to display No results found.   1. Insomnia     4:41 PM Patient seen and examined.   Vital signs reviewed and are as follows: Filed Vitals:    04/02/11 1618  BP: 147/80  Pulse:   Temp:   Resp:    Patient discharged home with prescription for Vistaril. Inform patient that I would not prescribe any benzodiazepine medications given the discrepancies in her history. PCP referrals given.   MDM  Patient reported insomnia, no other medical complaints. Patient was not forthcoming about her use of benzodiazepine medications for sleep. She is not forthcoming about her primary care physicians in [place]. Suspect an element of benzodiazepine abuse and/or drug-seeking behavior.        Renne Crigler, [place] 04/02/11 1958

## 2011-04-02 NOTE — ED Notes (Signed)
Unable to sleep x 2 weeks.  States she is out of medication to help her sleep.  Pt new to [facility] and doesn't have PCP.

## 2011-04-02 NOTE — Discharge Instructions (Signed)
Please read and follow all provided instructions.  Your diagnoses today include:  1. Insomnia     Tests performed today include:  Vital signs. See below for your results today.   Medications prescribed:   Vistaril - sleep medicationTake any prescribed medications only as directed.  Home care instructions:  Follow any educational materials contained in this packet.  Follow-up instructions: Please follow-up with your primary care provider in the next 3 days for further evaluation of your symptoms. If you do not have a primary care doctor -- see below for referral information.   Return instructions:   Please return to the Emergency Department if you experience worsening symptoms.   Please return if you have any other emergent concerns.  Additional Information:  Your vital signs today were: BP 147/80  Pulse 87  Temp(Src) 97.6 F (36.4 C) (Oral)  Resp 20  SpO2 96% If your blood pressure (BP) was elevated above 135/85 this visit, please have this repeated by your doctor within one month. -------------- No Primary Care Doctor Call [facility]  [phone] Other agencies that provide inexpensive medical care    [facility]  [phone]    [facility]  [phone]    [facility]  [phone]    [facility]  [phone]    [facility]  [phone]    [facility]  [phone] -------------- RESOURCE GUIDE:  Dental Problems  Patients with Medicaid: [facility] [phone] [address]                                            [id number] Phone:  [phone]                                                   Phone:  [phone]  If unable to pay or uninsured, contact:  [facility] or [facility]. to become qualified for the adult dental clinic.  Chronic Pain Problems Contact [facility]  [phone] Patients need to be referred by their primary  care doctor.  Insufficient Money for Medicine Contact [employer]:  call "211" or [facility] [phone].  Psychological Services [facility]  [phone] [facility]  [phone] [facility]   [phone] (emergency services [phone])  Substance Abuse Resources [facility]  [phone] [facility] [phone] [facility] [phone] Floydene Flock [phone] Residential & Outpatient Substance Abuse Program  [phone]  Abuse/Neglect [facility] Child Abuse Hotline [phone] [facility] Child Abuse Hotline [phone] (After Hours)  Emergency Shelter [facility] [phone]  Maternity Homes [facility] at the [place] [facility] [place] [phone] [place] [facility] [phone]  [facility] Resources  [facility]     [employer]                          [facility] [address] [place]  [address]      [address]  Blondell Reveal Phone:  [phone]                                   Phone:  [phone]                 Phone:  [phone]  [facility] Phone:  [phone]  [facility] Child Abuse Hotline [phone] (After Hours)

## 2011-04-02 NOTE — ED Provider Notes (Signed)
Medical screening examination/treatment/procedure(s) were performed by non-physician practitioner and as supervising physician I was immediately available for consultation/collaboration.  Gerhard Munch, MD 04/02/11 2006

## 2012-03-15 IMAGING — CR DG CHEST 2V
2 series · 2 of 2 positions shown · non-contrast
Comparison: A single view chest 11/24/2010.

CLINICAL DATA: Substernal chest pain.

CHEST - 2 VIEW

[w chest pa]
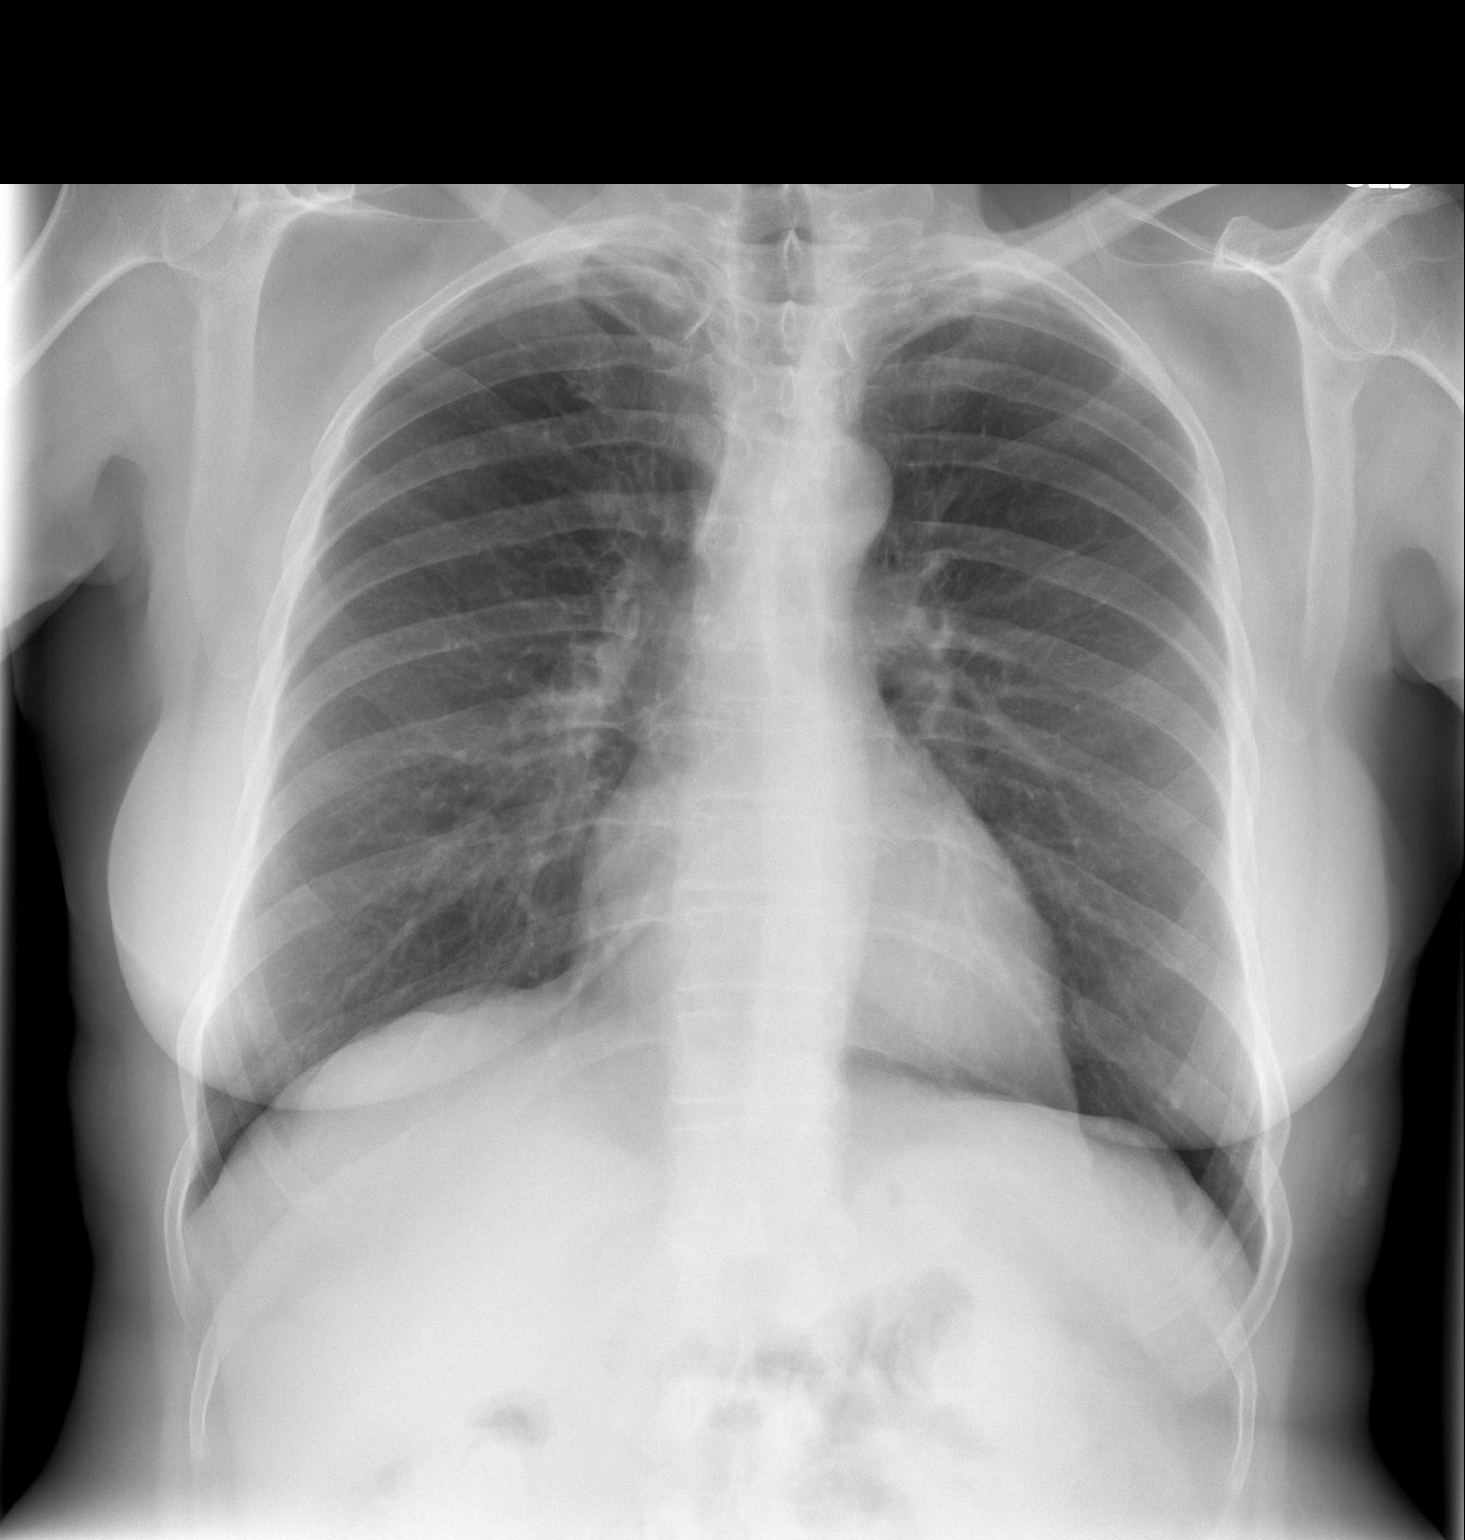

[w chest lat]
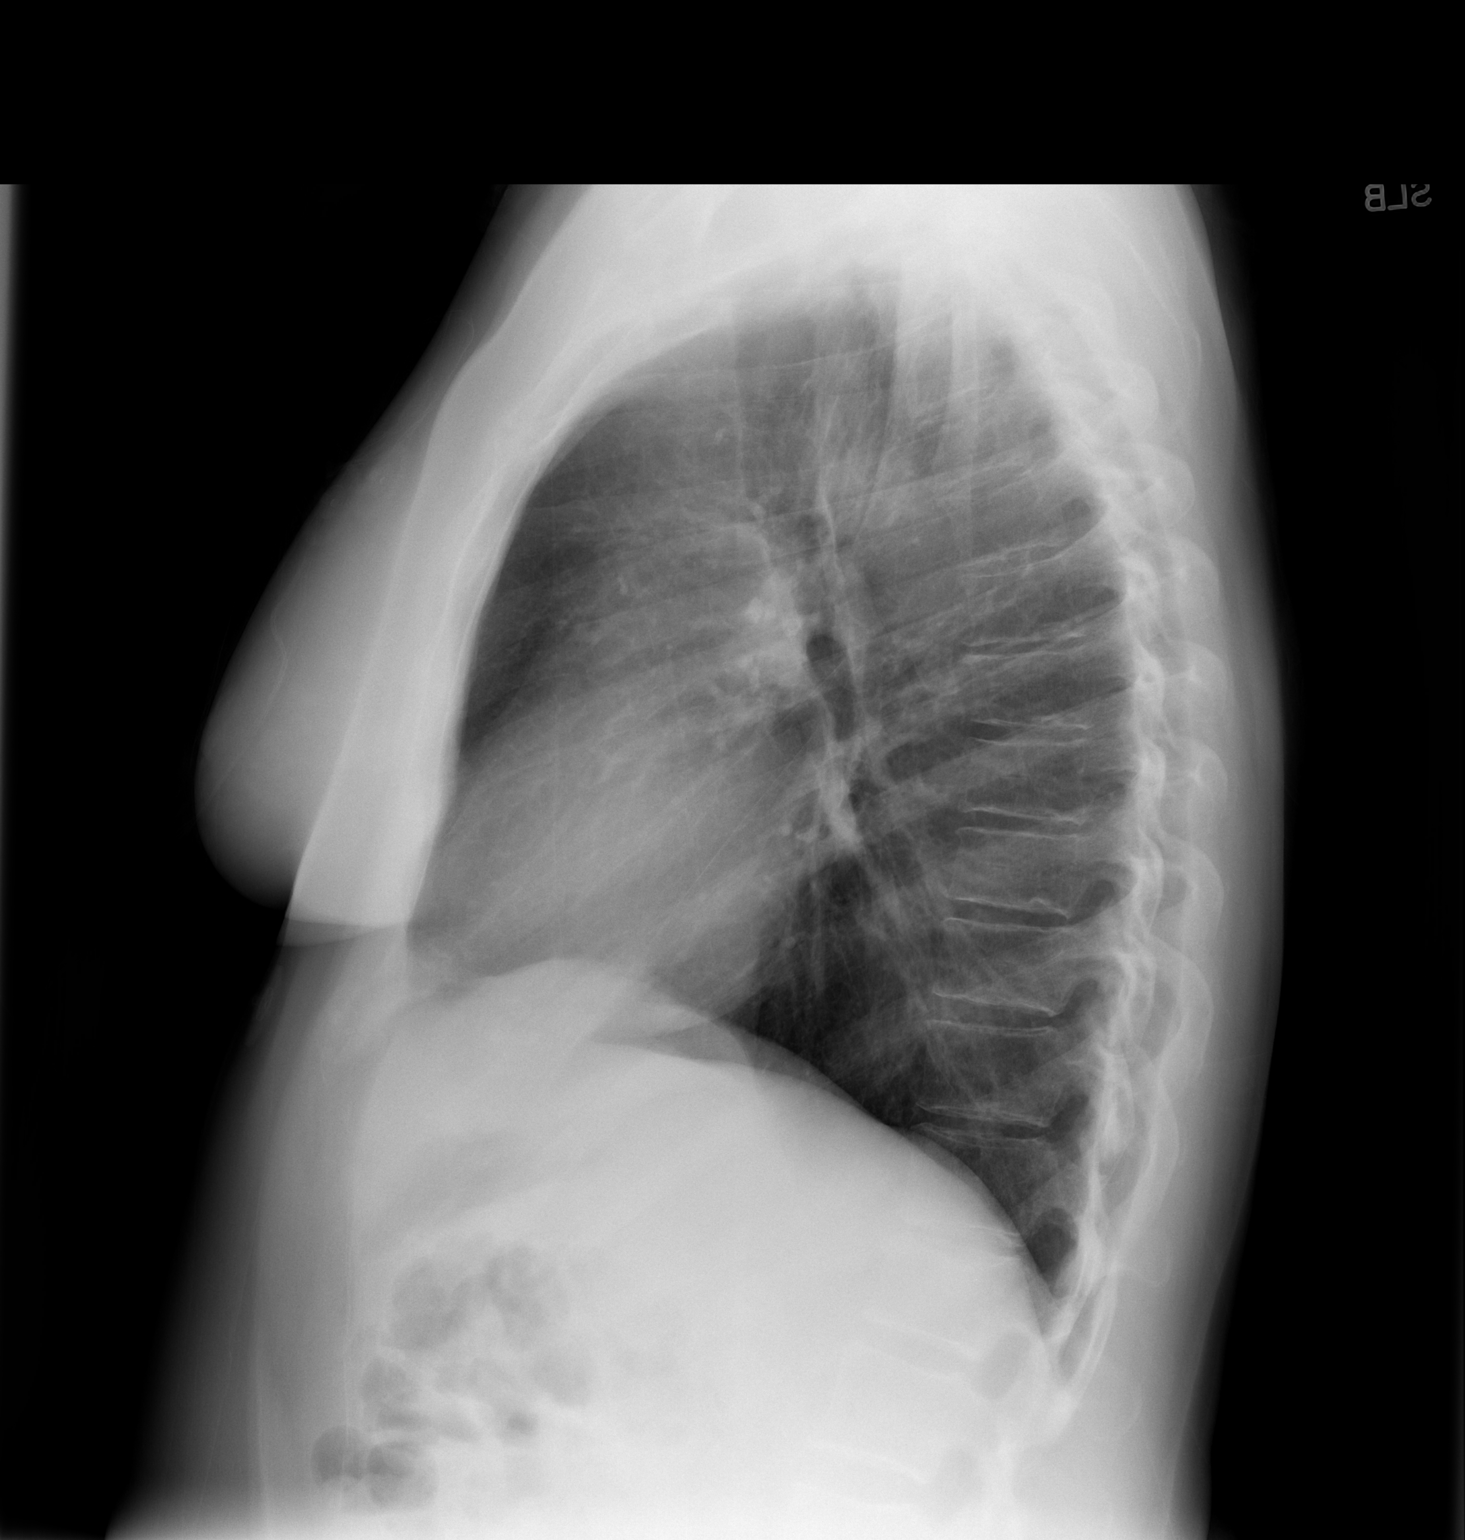

[2 of 2 positions shown; findings below may reference images not displayed]

FINDINGS: The heart size is normal.  The lungs are clear.  The
visualized soft tissues and bony thorax are unremarkable.
IMPRESSION: Negative chest.

## 2014-03-27 DIAGNOSIS — I10 Essential (primary) hypertension: Secondary | ICD-10-CM | POA: Diagnosis not present

## 2014-03-27 DIAGNOSIS — G40109 Localization-related (focal) (partial) symptomatic epilepsy and epileptic syndromes with simple partial seizures, not intractable, without status epilepticus: Secondary | ICD-10-CM | POA: Diagnosis not present

## 2014-03-27 DIAGNOSIS — G4700 Insomnia, unspecified: Secondary | ICD-10-CM | POA: Diagnosis not present

## 2014-03-27 DIAGNOSIS — E782 Mixed hyperlipidemia: Secondary | ICD-10-CM | POA: Diagnosis not present

## 2015-01-13 DIAGNOSIS — M1990 Unspecified osteoarthritis, unspecified site: Secondary | ICD-10-CM | POA: Diagnosis not present

## 2015-01-13 DIAGNOSIS — Z8679 Personal history of other diseases of the circulatory system: Secondary | ICD-10-CM | POA: Diagnosis not present

## 2015-08-26 DIAGNOSIS — I10 Essential (primary) hypertension: Secondary | ICD-10-CM | POA: Diagnosis not present

## 2015-08-26 DIAGNOSIS — R569 Unspecified convulsions: Secondary | ICD-10-CM | POA: Diagnosis not present

## 2015-08-26 DIAGNOSIS — R079 Chest pain, unspecified: Secondary | ICD-10-CM | POA: Diagnosis not present

## 2015-08-26 DIAGNOSIS — Z79899 Other long term (current) drug therapy: Secondary | ICD-10-CM | POA: Diagnosis not present

## 2015-08-26 DIAGNOSIS — K219 Gastro-esophageal reflux disease without esophagitis: Secondary | ICD-10-CM | POA: Diagnosis not present

## 2015-08-26 DIAGNOSIS — F5101 Primary insomnia: Secondary | ICD-10-CM | POA: Diagnosis not present

## 2015-12-30 ENCOUNTER — Telehealth: Payer: Self-pay | Admitting: *Deleted

## 2015-12-30 DIAGNOSIS — R569 Unspecified convulsions: Secondary | ICD-10-CM | POA: Diagnosis not present

## 2015-12-30 DIAGNOSIS — Z0001 Encounter for general adult medical examination with abnormal findings: Secondary | ICD-10-CM | POA: Diagnosis not present

## 2015-12-30 DIAGNOSIS — I10 Essential (primary) hypertension: Secondary | ICD-10-CM | POA: Diagnosis not present

## 2015-12-30 DIAGNOSIS — K219 Gastro-esophageal reflux disease without esophagitis: Secondary | ICD-10-CM | POA: Diagnosis not present

## 2015-12-30 DIAGNOSIS — Z79899 Other long term (current) drug therapy: Secondary | ICD-10-CM | POA: Diagnosis not present

## 2015-12-30 DIAGNOSIS — F5101 Primary insomnia: Secondary | ICD-10-CM | POA: Diagnosis not present

## 2015-12-30 DIAGNOSIS — R079 Chest pain, unspecified: Secondary | ICD-10-CM | POA: Diagnosis not present

## 2015-12-30 DIAGNOSIS — Z23 Encounter for immunization: Secondary | ICD-10-CM | POA: Diagnosis not present

## 2015-12-30 NOTE — Telephone Encounter (Signed)
NOTES SENT TO SCHEDULING ON 12/30/15.

## 2016-02-07 NOTE — Progress Notes (Addendum)
Cardiology Office Note   Date:  02/08/2016   ID:  [name] 1950-03-24, MRN [id number]  PCP:  Rachell Cipro, MD  Cardiologist:   Jenkins Rouge, MD   Chief Complaint  Patient presents with  . Establish Care      History of Present Illness: [name] is a 66 y.o. female who presents for evaluation of chest pain CRF;s include HTN. Seen by primary 12/30/15 complained of non exertional SSCP not related to GERD which is fine while on omeprazole She takes lorazepam for insomnia on occasion. She has had cerebral aneurysm clipping in 1997 and has residual seizures. No seizures in last 19 years.    LABS reviewed 12/29/16 Cr .99 LDL 91 TRY 387   She lost her son to esophageal cancer a few years ago    Past Medical History:  Diagnosis Date  . Depression   . Fibromyalgia   . Hypertension   . Insomnia   . Seizures ([place])     Past Surgical History:  Procedure Laterality Date  . ABDOMINAL HYSTERECTOMY    . CEREBRAL ANEURYSM REPAIR       Current Outpatient Prescriptions  Medication Sig Dispense Refill  . AMLODIPINE BESYLATE PO Take 1 tablet by mouth daily.    . cloNIDine (CATAPRES) 0.1 MG tablet Take 1 tablet (0.1 mg total) by mouth 2 (two) times daily. 180 tablet 3  . levETIRAcetam (KEPPRA) 500 MG tablet Take 1 tablet (500 mg total) by mouth 2 (two) times daily. 60 tablet 3  . LORazepam (ATIVAN) 0.5 MG tablet Take 0.5 mg by mouth daily.    Marland Kitchen losartan-hydrochlorothiazide (HYZAAR) 100-25 MG tablet Take 1 tablet by mouth daily.    . metoprolol tartrate (LOPRESSOR) 25 MG tablet Take 25 mg by mouth 2 (two) times daily.    Marland Kitchen omeprazole (PRILOSEC) 20 MG capsule Take 20 mg by mouth daily.     No current facility-administered medications for this visit.     Allergies:   Patient has no known allergies.    Social History:  The patient  reports that she has never smoked. She does not have any smokeless tobacco history on file. She reports that she does not drink alcohol or use  drugs.   Family History:  The patient's family history includes Arthritis in her brother; Cancer in her brother; Heart disease in her father and mother.    ROS:  Please see the history of present illness.   Otherwise, review of systems are positive for none.   All other systems are reviewed and negative.    PHYSICAL EXAM: VS:  BP (!) 180/90   Pulse 96   Ht 5\' 6"  (1.676 m)   Wt 183 lb 12.8 oz (83.4 kg)   SpO2 98%   BMI 29.67 kg/m  , BMI Body mass index is 29.67 kg/m. Affect appropriate Healthy:  appears stated age 54: normal Neck supple with no adenopathy JVP normal no bruits no thyromegaly Lungs clear with no wheezing and good diaphragmatic motion Heart:  S1/S2 no murmur, no rub, gallop or click PMI normal Abdomen: benighn, BS positve, no tenderness, no AAA no bruit.  No HSM or HJR Distal pulses intact with no bruits No edema Neuro non-focal Skin warm and dry No muscular weakness    EKG:   12/25/10 SR rate 78 normal ECG  12/30/15  SR rate 111 normal  02/08/16  SR rate 83 normal    Recent Labs: No results found for requested labs within last 8760  hours.    Lipid Panel    Component Value Date/Time   CHOL 217 (H) 06/07/2010 0920   TRIG 305 (H) 06/07/2010 0920   HDL 59 06/07/2010 0920   CHOLHDL 3.7 06/07/2010 0920   VLDL 61 (H) 06/07/2010 0920   LDLCALC 97 06/07/2010 0920      Wt Readings from Last 3 Encounters:  02/08/16 183 lb 12.8 oz (83.4 kg)  01/07/11 155 lb (70.3 kg)  11/25/10 157 lb 6.5 oz (71.4 kg)      Other studies Reviewed: Additional studies/ records that were reviewed today include: Primary Care notes ECG and labs .    ASSESSMENT AND PLAN:  1. Chest Pain atypical normal ECG f/u ETT 2. Chol: less than 100 no documented vascular disease continue diet Rx 3. HTN: increase clonidine to .1 bid f/u primary    Current medicines are reviewed at length with the patient today.  The patient does not have concerns regarding medicines.  The  following changes have been made:  Increase Clonidine to .1 mg bid  Labs/ tests ordered today include: ETT   Orders Placed This Encounter  Procedures  . EXERCISE TOLERANCE TEST     Disposition:   FU with me PRN      Signed, Jenkins Rouge, MD  02/08/2016 4:23 PM    [facility] [place], [place], [place]  [address] Phone: [phone]; Fax: [phone]

## 2016-02-08 ENCOUNTER — Encounter: Payer: Self-pay | Admitting: Cardiovascular Disease

## 2016-02-08 ENCOUNTER — Ambulatory Visit (INDEPENDENT_AMBULATORY_CARE_PROVIDER_SITE_OTHER): Payer: Medicare Other | Admitting: Cardiovascular Disease

## 2016-02-08 ENCOUNTER — Encounter (INDEPENDENT_AMBULATORY_CARE_PROVIDER_SITE_OTHER): Payer: Self-pay

## 2016-02-08 VITALS — BP 180/90 | HR 96 | Ht 66.0 in | Wt 183.8 lb

## 2016-02-08 DIAGNOSIS — R079 Chest pain, unspecified: Secondary | ICD-10-CM

## 2016-02-08 DIAGNOSIS — Z7689 Persons encountering health services in other specified circumstances: Secondary | ICD-10-CM | POA: Diagnosis not present

## 2016-02-08 MED ORDER — CLONIDINE HCL 0.1 MG PO TABS: 0.1000 mg | ORAL_TABLET | Freq: Two times a day (BID) | ORAL | 3 refills | Status: DC

## 2016-02-08 NOTE — Patient Instructions (Addendum)
Medication Instructions:  Your physician has recommended you make the following change in your medication:   1-Increase Clonidine 0.1 mg by mouth twice daily  Please call us with your current dose of amlodipine, [phone]  Lab work: NONE  Testing/Procedures: Your physician has requested that you have an exercise tolerance test. For further information please visit [email or web address]. Please also follow instruction sheet, as given.  Follow-Up: Your physician wants you to follow-up as needed with Dr. Johnsie Cancel.    If you need a refill on your cardiac medications before your next appointment, please call your pharmacy.

## 2016-02-11 ENCOUNTER — Telehealth: Payer: Self-pay | Admitting: Cardiovascular Disease

## 2016-02-11 NOTE — Telephone Encounter (Signed)
Mrs.Poirier is calling to give the mg's of Amlodipine which is 5 mgs . Thanks

## 2016-02-11 NOTE — Telephone Encounter (Signed)
Made changes to patient's medication list.

## 2016-02-14 NOTE — Addendum Note (Signed)
Addended by: Roberts Gaudy on: 02/14/2016 07:58 AM   Modules accepted: Orders

## 2016-03-01 ENCOUNTER — Ambulatory Visit (INDEPENDENT_AMBULATORY_CARE_PROVIDER_SITE_OTHER): Payer: Medicare Other

## 2016-03-01 DIAGNOSIS — R079 Chest pain, unspecified: Secondary | ICD-10-CM | POA: Diagnosis not present

## 2016-03-01 DIAGNOSIS — Z7689 Persons encountering health services in other specified circumstances: Secondary | ICD-10-CM

## 2016-03-01 LAB — EXERCISE TOLERANCE TEST
CSEPED: 6 min
CSEPHR: 90 %
Estimated workload: 7 METS
Exercise duration (sec): 0 s
MPHR: 153 {beats}/min
Peak HR: 139 {beats}/min
RPE: 17
Rest HR: 83 {beats}/min

## 2016-05-26 DIAGNOSIS — I10 Essential (primary) hypertension: Secondary | ICD-10-CM | POA: Diagnosis not present

## 2016-05-26 DIAGNOSIS — R51 Headache: Secondary | ICD-10-CM | POA: Diagnosis not present

## 2016-05-26 DIAGNOSIS — H539 Unspecified visual disturbance: Secondary | ICD-10-CM | POA: Diagnosis not present

## 2016-12-28 DIAGNOSIS — E7800 Pure hypercholesterolemia, unspecified: Secondary | ICD-10-CM | POA: Diagnosis not present

## 2016-12-28 DIAGNOSIS — Z23 Encounter for immunization: Secondary | ICD-10-CM | POA: Diagnosis not present

## 2016-12-28 DIAGNOSIS — I10 Essential (primary) hypertension: Secondary | ICD-10-CM | POA: Diagnosis not present

## 2016-12-28 DIAGNOSIS — R0602 Shortness of breath: Secondary | ICD-10-CM | POA: Diagnosis not present

## 2016-12-28 DIAGNOSIS — K219 Gastro-esophageal reflux disease without esophagitis: Secondary | ICD-10-CM | POA: Diagnosis not present

## 2016-12-28 DIAGNOSIS — F5101 Primary insomnia: Secondary | ICD-10-CM | POA: Diagnosis not present

## 2016-12-28 DIAGNOSIS — Z79899 Other long term (current) drug therapy: Secondary | ICD-10-CM | POA: Diagnosis not present

## 2016-12-28 DIAGNOSIS — R569 Unspecified convulsions: Secondary | ICD-10-CM | POA: Diagnosis not present

## 2016-12-28 DIAGNOSIS — Z1211 Encounter for screening for malignant neoplasm of colon: Secondary | ICD-10-CM | POA: Diagnosis not present

## 2016-12-28 DIAGNOSIS — E781 Pure hyperglyceridemia: Secondary | ICD-10-CM | POA: Diagnosis not present

## 2017-01-03 ENCOUNTER — Encounter: Payer: Self-pay | Admitting: Gastroenterology

## 2017-01-08 DIAGNOSIS — E7800 Pure hypercholesterolemia, unspecified: Secondary | ICD-10-CM | POA: Diagnosis not present

## 2017-01-21 ENCOUNTER — Other Ambulatory Visit: Payer: Self-pay | Admitting: Cardiovascular Disease

## 2017-01-21 DIAGNOSIS — R079 Chest pain, unspecified: Secondary | ICD-10-CM

## 2017-02-14 ENCOUNTER — Telehealth: Payer: Self-pay | Admitting: *Deleted

## 2017-02-14 NOTE — Telephone Encounter (Signed)
Pt no show pv today. Called pt, no answer, left message for pt to call us back today before 5 pm to reschedule this pv appointment.

## 2017-02-20 ENCOUNTER — Telehealth: Payer: Self-pay | Admitting: *Deleted

## 2017-02-20 ENCOUNTER — Encounter: Payer: Self-pay | Admitting: Gastroenterology

## 2017-02-20 NOTE — Telephone Encounter (Signed)
Pt no showed a 1 pm pre visit today. Called number listed- no name- left a generic message to call [place] and our number by 5 pm today. No other information left Printed and mailed no show letter  Lelan Pons PV

## 2017-02-28 ENCOUNTER — Encounter: Payer: Medicare Other | Admitting: Gastroenterology

## 2017-04-05 ENCOUNTER — Ambulatory Visit (AMBULATORY_SURGERY_CENTER): Payer: Self-pay | Admitting: *Deleted

## 2017-04-05 ENCOUNTER — Other Ambulatory Visit: Payer: Self-pay

## 2017-04-05 VITALS — Ht 66.0 in | Wt 183.6 lb

## 2017-04-05 DIAGNOSIS — Z1211 Encounter for screening for malignant neoplasm of colon: Secondary | ICD-10-CM

## 2017-04-05 MED ORDER — PEG 3350-KCL-NA BICARB-NACL 420 G PO SOLR: 4000.0000 mL | Freq: Once | ORAL | 0 refills | Status: AC

## 2017-04-05 NOTE — Progress Notes (Signed)
No egg or soy allergy known to patient  No issues with past sedation with any surgeries  or procedures, no intubation problems  No diet pills per patient No home 02 use per patient  No blood thinners per patient  Pt denies issues with constipation  No A fib  But has had in past A flutter with  Palpitations  Saw Cardiologist and was cleared.  No non medication EMMI video sent to pt's e mail pt. Declined has had colonoscopy before

## 2017-04-25 ENCOUNTER — Encounter: Payer: Medicare Other | Admitting: Gastroenterology

## 2017-05-01 ENCOUNTER — Other Ambulatory Visit: Payer: Self-pay

## 2017-05-01 ENCOUNTER — Encounter: Payer: Self-pay | Admitting: Gastroenterology

## 2017-05-01 ENCOUNTER — Ambulatory Visit (AMBULATORY_SURGERY_CENTER): Payer: Medicare Other | Admitting: Gastroenterology

## 2017-05-01 VITALS — BP 125/74 | HR 58 | Temp 98.0°F | Resp 12 | Ht 66.0 in | Wt 183.0 lb

## 2017-05-01 DIAGNOSIS — Z1211 Encounter for screening for malignant neoplasm of colon: Secondary | ICD-10-CM

## 2017-05-01 DIAGNOSIS — C189 Malignant neoplasm of colon, unspecified: Secondary | ICD-10-CM | POA: Diagnosis not present

## 2017-05-01 DIAGNOSIS — Z1212 Encounter for screening for malignant neoplasm of rectum: Secondary | ICD-10-CM | POA: Diagnosis not present

## 2017-05-01 DIAGNOSIS — C761 Malignant neoplasm of thorax: Secondary | ICD-10-CM | POA: Diagnosis not present

## 2017-05-01 DIAGNOSIS — Z800 Family history of malignant neoplasm of digestive organs: Secondary | ICD-10-CM | POA: Diagnosis not present

## 2017-05-01 MED ORDER — SODIUM CHLORIDE 0.9 % IV SOLN
500.0000 mL | Freq: Once | INTRAVENOUS | Status: DC
Start: 2017-05-01 — End: 2022-06-23

## 2017-05-01 NOTE — Progress Notes (Signed)
To PACU, VSS. Report to [email or web address] 

## 2017-05-01 NOTE — Op Note (Signed)
[facility] Patient Name: [name] Procedure Date: 05/01/2017 10:04 AM MRN: [id number] Endoscopist: Milus Banister , MD Age: 67 Referring MD:  Date of Birth: 28-Aug-1950 Gender: Female Account #: [id number] Procedure:                Colonoscopy Indications:              Screening for colorectal malignant neoplasm;                            colonoscopy in [place] >10 years ago (no polyps per                            patient) Medicines:                Monitored Anesthesia Care Procedure:                Pre-Anesthesia Assessment:                           - Prior to the procedure, a History and Physical                            was performed, and patient medications and                            allergies were reviewed. The patient's tolerance of                            previous anesthesia was also reviewed. The risks                            and benefits of the procedure and the sedation                            options and risks were discussed with the patient.                            All questions were answered, and informed consent                            was obtained. Prior Anticoagulants: The patient has                            taken no previous anticoagulant or antiplatelet                            agents. ASA Grade Assessment: II - A patient with                            mild systemic disease. After reviewing the risks                            and benefits, the patient was deemed in  satisfactory condition to undergo the procedure.                           After obtaining informed consent, the colonoscope                            was passed under direct vision. Throughout the                            procedure, the patient's blood pressure, pulse, and                            oxygen saturations were monitored continuously. The                            Colonoscope was introduced through the anus and                  advanced to the the cecum, identified by                            appendiceal orifice and ileocecal valve. The                            colonoscopy was performed without difficulty. The                            patient tolerated the procedure well. The quality                            of the bowel preparation was good. The ileocecal                            valve, appendiceal orifice, and rectum were                            photographed. Scope In: 10:13:48 AM Scope Out: 10:27:16 AM Scope Withdrawal Time: 0 hours 8 minutes 44 seconds  Total Procedure Duration: 0 hours 13 minutes 28 seconds  Findings:                 The entire examined colon appeared normal on direct                            and retroflexion views. Complications:            No immediate complications. Estimated Blood Loss:     Estimated blood loss: none. Impression:               - The entire examined colon is normal on direct and                            retroflexion views.                           - No polyps or cancers. Recommendation:           - Patient  has a contact number available for                            emergencies. The signs and symptoms of potential                            delayed complications were discussed with the                            patient. Return to normal activities tomorrow.                            Written discharge instructions were provided to the                            patient.                           - Resume previous diet.                           - Continue present medications.                           - Repeat colonoscopy in 10 years for screening                            purposes. Milus Banister, MD 05/01/2017 10:29:49 AM This report has been signed electronically.

## 2017-05-01 NOTE — Progress Notes (Signed)
Pt's states no medical or surgical changes since previsit or office visit. 

## 2017-05-01 NOTE — Patient Instructions (Signed)
YOU HAD AN ENDOSCOPIC PROCEDURE TODAY AT [facility]:   Refer to the procedure report that was given to you for any specific questions about what was found during the examination.  If the procedure report does not answer your questions, please call your gastroenterologist to clarify.  If you requested that your care partner not be given the details of your procedure findings, then the procedure report has been included in a sealed envelope for you to review at your convenience later.  YOU SHOULD EXPECT: Some feelings of bloating in the abdomen. Passage of more gas than usual.  Walking can help get rid of the air that was put into your GI tract during the procedure and reduce the bloating. If you had a lower endoscopy (such as a colonoscopy or flexible sigmoidoscopy) you may notice spotting of blood in your stool or on the toilet paper. If you underwent a bowel prep for your procedure, you may not have a normal bowel movement for a few days.  Please Note:  You might notice some irritation and congestion in your nose or some drainage.  This is from the oxygen used during your procedure.  There is no need for concern and it should clear up in a day or so.  SYMPTOMS TO REPORT IMMEDIATELY:   Following lower endoscopy (colonoscopy or flexible sigmoidoscopy):  Excessive amounts of blood in the stool  Significant tenderness or worsening of abdominal pains  Swelling of the abdomen that is new, acute  Fever of 100F or higher   For urgent or emergent issues, a gastroenterologist can be reached at any hour by calling [phone].   DIET:  We do recommend a small meal at first, but then you may proceed to your regular diet.  Drink plenty of fluids but you should avoid alcoholic beverages for 24 hours.  ACTIVITY:  You should plan to take it easy for the rest of today and you should NOT DRIVE or use heavy machinery until tomorrow (because of the sedation medicines used during the test).     FOLLOW UP: Our staff will call the number listed on your records the next business day following your procedure to check on you and address any questions or concerns that you may have regarding the information given to you following your procedure. If we do not reach you, we will leave a message.  However, if you are feeling well and you are not experiencing any problems, there is no need to return our call.  We will assume that you have returned to your regular daily activities without incident.  If any biopsies were taken you will be contacted by phone or by letter within the next 1-3 weeks.  Please call us at [phone] if you have not heard about the biopsies in 3 weeks.    SIGNATURES/CONFIDENTIALITY: You and/or your care partner have signed paperwork which will be entered into your electronic medical record.  These signatures attest to the fact that that the information above on your After Visit Summary has been reviewed and is understood.  Full responsibility of the confidentiality of this discharge information lies with you and/or your care-partner.  Recall colonoscopy 10 years.

## 2017-05-02 ENCOUNTER — Telehealth: Payer: Self-pay | Admitting: *Deleted

## 2017-05-02 NOTE — Telephone Encounter (Signed)
Unable to leave message on f/u call . [phone] has been disconnected.

## 2018-01-22 DIAGNOSIS — F5101 Primary insomnia: Secondary | ICD-10-CM | POA: Diagnosis not present

## 2018-01-22 DIAGNOSIS — R350 Frequency of micturition: Secondary | ICD-10-CM | POA: Diagnosis not present

## 2018-01-22 DIAGNOSIS — E7800 Pure hypercholesterolemia, unspecified: Secondary | ICD-10-CM | POA: Diagnosis not present

## 2018-01-22 DIAGNOSIS — I10 Essential (primary) hypertension: Secondary | ICD-10-CM | POA: Diagnosis not present

## 2018-01-22 DIAGNOSIS — Z23 Encounter for immunization: Secondary | ICD-10-CM | POA: Diagnosis not present

## 2018-01-22 DIAGNOSIS — Z79899 Other long term (current) drug therapy: Secondary | ICD-10-CM | POA: Diagnosis not present

## 2018-01-22 DIAGNOSIS — K219 Gastro-esophageal reflux disease without esophagitis: Secondary | ICD-10-CM | POA: Diagnosis not present

## 2018-01-22 DIAGNOSIS — L600 Ingrowing nail: Secondary | ICD-10-CM | POA: Diagnosis not present

## 2018-01-22 DIAGNOSIS — G5793 Unspecified mononeuropathy of bilateral lower limbs: Secondary | ICD-10-CM | POA: Diagnosis not present

## 2018-01-22 DIAGNOSIS — R569 Unspecified convulsions: Secondary | ICD-10-CM | POA: Diagnosis not present

## 2018-03-19 ENCOUNTER — Encounter (HOSPITAL_COMMUNITY): Payer: Self-pay | Admitting: Emergency Medicine

## 2018-03-19 ENCOUNTER — Emergency Department (HOSPITAL_COMMUNITY): Payer: Medicare Other

## 2018-03-19 ENCOUNTER — Emergency Department (HOSPITAL_COMMUNITY)
Admission: EM | Admit: 2018-03-19 | Discharge: 2018-03-19 | Disposition: A | Discharge status: Home / Self Care | Payer: Medicare Other | Attending: Emergency Medicine | Admitting: Emergency Medicine

## 2018-03-19 ENCOUNTER — Other Ambulatory Visit: Payer: Self-pay

## 2018-03-19 DIAGNOSIS — Z79899 Other long term (current) drug therapy: Secondary | ICD-10-CM | POA: Diagnosis not present

## 2018-03-19 DIAGNOSIS — Z87891 Personal history of nicotine dependence: Secondary | ICD-10-CM | POA: Insufficient documentation

## 2018-03-19 DIAGNOSIS — R51 Headache: Secondary | ICD-10-CM | POA: Insufficient documentation

## 2018-03-19 DIAGNOSIS — I10 Essential (primary) hypertension: Secondary | ICD-10-CM | POA: Insufficient documentation

## 2018-03-19 LAB — CBC WITH DIFFERENTIAL/PLATELET
ABS IMMATURE GRANULOCYTES: 0.05 10*3/uL (ref 0.00–0.07)
Basophils Absolute: 0.1 10*3/uL (ref 0.0–0.1)
Basophils Relative: 1 %
Eosinophils Absolute: 0.3 10*3/uL (ref 0.0–0.5)
Eosinophils Relative: 3 %
HCT: 45.6 % (ref 36.0–46.0)
Hemoglobin: 14.9 g/dL (ref 12.0–15.0)
Immature Granulocytes: 1 %
Lymphocytes Relative: 45 %
Lymphs Abs: 4.5 10*3/uL — ABNORMAL HIGH (ref 0.7–4.0)
MCH: 30 pg (ref 26.0–34.0)
MCHC: 32.7 g/dL (ref 30.0–36.0)
MCV: 91.8 fL (ref 80.0–100.0)
MONOS PCT: 9 %
Monocytes Absolute: 0.9 10*3/uL (ref 0.1–1.0)
Neutro Abs: 4.1 10*3/uL (ref 1.7–7.7)
Neutrophils Relative %: 41 %
Platelets: 362 10*3/uL (ref 150–400)
RBC: 4.97 MIL/uL (ref 3.87–5.11)
RDW: 12.5 % (ref 11.5–15.5)
WBC: 9.9 10*3/uL (ref 4.0–10.5)
nRBC: 0 % (ref 0.0–0.2)

## 2018-03-19 LAB — BASIC METABOLIC PANEL
Anion gap: 10 (ref 5–15)
BUN: 18 mg/dL (ref 8–23)
CHLORIDE: 100 mmol/L (ref 98–111)
CO2: 26 mmol/L (ref 22–32)
Calcium: 9.6 mg/dL (ref 8.9–10.3)
Creatinine, Ser: 0.95 mg/dL (ref 0.44–1.00)
GFR calc Af Amer: >60 mL/min (ref >60)
GFR calc non Af Amer: >60 mL/min (ref >60)
GLUCOSE: 92 mg/dL (ref 70–99)
Potassium: 3.8 mmol/L (ref 3.5–5.1)
Sodium: 136 mmol/L (ref 135–145)

## 2018-03-19 MED ORDER — CLONIDINE HCL 0.1 MG PO TABS
0.1000 mg | ORAL_TABLET | Freq: Once | ORAL | Status: AC
  Administered 2018-03-19: 0.1 mg via ORAL
  Filled 2018-03-19: qty 1

## 2018-03-19 NOTE — ED Provider Notes (Signed)
[facility] DEPT Provider Note   CSN: [id number] Arrival date & time: 03/19/18  1211    History   Chief Complaint Chief Complaint  Patient presents with  . Hypertension    HPI [name] is a 68 y.o. female.     This is a 68 year old female who presents with increased blood pressure times several days with mild occipital headache.  Does have a history of cerebral artery aneurysm in the past that was clipped.  She has had nausea but no vomiting.  No visual changes.  No upper or lower extremity weakness.  Denies any associated chest pain or chest pressure.  Has not been short of breath.  She has not seen her doctor for her current symptoms and has not had her blood pressure medication adjusted in several months.  On review of her medical records, patient is supposed be taking Catapres 0.1 mg twice daily but she states that she is only taking it once at night.  Patient is unsure of what her blood pressure normally runs at home.     Past Medical History:  Diagnosis Date  . Aneurysm ([place])    brain  . Anxiety   . Arthritis   . Depression   . Fibromyalgia   . GERD (gastroesophageal reflux disease)   . Hyperlipidemia    pt. thought she was placed on medication for cholesterol but does not know for sure.  . Hypertension   . Insomnia   . Seizures ([place])    [phone]    Patient Active Problem List   Diagnosis Date Noted  . Hypertension 01/07/2011  . Suicidal thoughts 01/06/2011  . Gait instability 11/28/2010  . Hyponatremia 11/28/2010  . Ulnar neuropathy 06/03/2010  . Depression 06/03/2010  . Insomnia 06/03/2010  . Seizure ([place]) 06/03/2010  . Recurrent UTI 06/03/2010  . Arthritis 06/03/2010    Past Surgical History:  Procedure Laterality Date  . ABDOMINAL HYSTERECTOMY    . CEREBRAL ANEURYSM REPAIR    . CEREBRAL ANEURYSM REPAIR     clipped   . COLONOSCOPY     12+ years ago [place]  . POLYPECTOMY       OB History   No obstetric history  on file.      Home Medications    Prior to Admission medications   Medication Sig Start Date End Date Taking? Authorizing Provider  acetaminophen (TYLENOL) 500 MG tablet Take 1,000 mg by mouth 2 (two) times daily.   Yes [provider]  AMLODIPINE BESYLATE PO Take 5 mg by mouth daily.    Yes [provider]  baclofen (LIORESAL) 10 MG tablet Take 10 mg by mouth 3 (three) times daily as needed for muscle spasms.   Yes [provider]  cloNIDine (CATAPRES) 0.1 MG tablet TAKE 1 TABLET(0.1 MG) BY MOUTH TWICE DAILY Patient taking differently: Take 0.1 mg by mouth every evening.  01/22/17  Yes Josue Hector, MD  fenofibrate 54 MG tablet Take 54 mg by mouth every morning.  03/22/17  Yes [provider]  hydrochlorothiazide (HYDRODIURIL) 25 MG tablet Take 25 mg by mouth every evening.   Yes [provider]  levETIRAcetam (KEPPRA) 500 MG tablet Take 1 tablet (500 mg total) by mouth 2 (two) times daily. 06/02/10  Yes Judithann Sheen, MD  LORazepam (ATIVAN) 0.5 MG tablet Take 0.5 mg by mouth every evening.    Yes [provider]  losartan-hydrochlorothiazide (HYZAAR) 100-25 MG tablet Take 1 tablet by mouth daily.  Yes [provider]  metoprolol tartrate (LOPRESSOR) 50 MG tablet Take 50 mg by mouth 2 (two) times daily.    Yes [provider]  naproxen sodium (ALEVE) 220 MG tablet Take 440 mg by mouth 2 (two) times daily.   Yes [provider]  omeprazole (PRILOSEC) 20 MG capsule Take 20 mg by mouth every evening.    Yes [provider]    Family History Family History  Problem Relation Age of Onset  . Heart disease Mother   . Heart disease Father   . Cancer Brother   . Arthritis Brother   . Colon cancer Paternal Grandfather   . Esophageal cancer Son   . Colon polyps Neg Hx   . Rectal cancer Neg Hx   . Stomach cancer Neg Hx     Social History Social History   Tobacco Use  . Smoking status: Former  Smoker    Last attempt to quit: 1985    Years since quitting: 35.1  . Smokeless tobacco: Never Used  Substance Use Topics  . Alcohol use: No  . Drug use: No     Allergies   Barium-containing compounds   Review of Systems Review of Systems  All other systems reviewed and are negative.    Physical Exam Updated Vital Signs BP (!) 174/88 (BP Location: Left Arm)   Pulse 85   Temp 97.7 F (36.5 C) (Oral)   Resp 19   Ht 1.676 m (5\' 6" )   Wt 79.4 kg   SpO2 100%   BMI 28.25 kg/m   Physical Exam Vitals signs and nursing note reviewed.  Constitutional:      General: She is not in acute distress.    Appearance: Normal appearance. She is well-developed. She is not toxic-appearing.  HENT:     Head: Normocephalic and atraumatic.  Eyes:     General: Lids are normal.     Conjunctiva/sclera: Conjunctivae normal.     Pupils: Pupils are equal, round, and reactive to light.  Neck:     Musculoskeletal: Normal range of motion and neck supple.     Thyroid: No thyroid mass.     Trachea: No tracheal deviation.  Cardiovascular:     Rate and Rhythm: Normal rate and regular rhythm.     Heart sounds: Normal heart sounds. No murmur. No gallop.   Pulmonary:     Effort: Pulmonary effort is normal. No respiratory distress.     Breath sounds: Normal breath sounds. No stridor. No decreased breath sounds, wheezing, rhonchi or rales.  Abdominal:     General: Bowel sounds are normal. There is no distension.     Palpations: Abdomen is soft.     Tenderness: There is no abdominal tenderness. There is no rebound.  Musculoskeletal: Normal range of motion.        General: No tenderness.  Skin:    General: Skin is warm and dry.     Findings: No abrasion or rash.  Neurological:     Mental Status: She is alert and oriented to person, place, and time.     GCS: GCS eye subscore is 4. GCS verbal subscore is 5. GCS motor subscore is 6.     Cranial Nerves: No cranial nerve deficit.     Sensory: No  sensory deficit.  Psychiatric:        Speech: Speech normal.        Behavior: Behavior normal.      ED Treatments / Results  Labs (all  labs ordered are listed, but only abnormal results are displayed) Labs Reviewed  BASIC METABOLIC PANEL  CBC WITH DIFFERENTIAL/PLATELET    EKG None  Radiology No results found.  Procedures Procedures (including critical care time)  Medications Ordered in ED Medications  cloNIDine (CATAPRES) tablet 0.1 mg (has no administration in time range)     Initial Impression / Assessment and Plan / ED Course  I have reviewed the triage vital signs and the nursing notes.  Pertinent labs & imaging results that were available during my care of the patient were reviewed by me and considered in my medical decision making (see chart for details).        Patient given clonidine and blood pressure has improved.  Encouraged her to take the clonidine twice a day and follow-up with her doctor.  Final Clinical Impressions(s) / ED Diagnoses   Final diagnoses:  None    ED Discharge Orders    None       Lacretia Leigh, MD 03/19/18 1851

## 2018-03-19 NOTE — Discharge Instructions (Addendum)
Take your clonidine twice a day as directed.  Call your doctor tomorrow to schedule a follow-up visit

## 2018-03-19 NOTE — ED Triage Notes (Signed)
Patient reports elevated BP x5 days despite taking prescribed medications. Reports intermittent headaches. Hx aneurysm.

## 2018-03-26 DIAGNOSIS — I10 Essential (primary) hypertension: Secondary | ICD-10-CM | POA: Diagnosis not present

## 2018-03-26 DIAGNOSIS — E7800 Pure hypercholesterolemia, unspecified: Secondary | ICD-10-CM | POA: Diagnosis not present

## 2018-03-26 DIAGNOSIS — Z8249 Family history of ischemic heart disease and other diseases of the circulatory system: Secondary | ICD-10-CM | POA: Diagnosis not present

## 2018-06-20 DIAGNOSIS — N390 Urinary tract infection, site not specified: Secondary | ICD-10-CM | POA: Diagnosis not present

## 2018-06-20 DIAGNOSIS — R350 Frequency of micturition: Secondary | ICD-10-CM | POA: Diagnosis not present

## 2018-07-10 ENCOUNTER — Ambulatory Visit: Payer: Medicare Other | Admitting: Physician Assistant

## 2018-10-18 DIAGNOSIS — Z23 Encounter for immunization: Secondary | ICD-10-CM | POA: Diagnosis not present

## 2018-12-11 ENCOUNTER — Other Ambulatory Visit: Payer: Self-pay

## 2018-12-11 DIAGNOSIS — Z20822 Contact with and (suspected) exposure to covid-19: Secondary | ICD-10-CM

## 2018-12-13 LAB — NOVEL CORONAVIRUS, NAA: SARS-CoV-2, NAA: NOT DETECTED

## 2018-12-18 ENCOUNTER — Telehealth: Payer: Self-pay

## 2018-12-18 NOTE — Telephone Encounter (Signed)
Patient given negative result and verbalized understanding  

## 2019-03-27 ENCOUNTER — Ambulatory Visit: Payer: Medicare HMO | Attending: Internal Medicine

## 2019-03-27 DIAGNOSIS — Z23 Encounter for immunization: Secondary | ICD-10-CM | POA: Insufficient documentation

## 2019-03-27 NOTE — Progress Notes (Signed)
   Covid-19 Vaccination Clinic  Name:  [name]    MRN: [id number] DOB: [birth year]  03/27/2019  [name] was observed post Covid-19 immunization for 15 minutes without incident. She was provided with Vaccine Information Sheet and instruction to access the V-Safe system.   [name] was instructed to call 911 with any severe reactions post vaccine: Marland Kitchen Difficulty breathing  . Swelling of face and throat  . A fast heartbeat  . A bad rash all over body  . Dizziness and weakness   Immunizations Administered    Name Date Dose VIS Date Route   Pfizer COVID-19 Vaccine 03/27/2019  2:21 PM 0.3 mL 01/03/2019 Intramuscular   Manufacturer: [place]   Lot: [id number]   [place]: [id number]

## 2019-04-15 DIAGNOSIS — H2513 Age-related nuclear cataract, bilateral: Secondary | ICD-10-CM | POA: Diagnosis not present

## 2019-04-23 ENCOUNTER — Ambulatory Visit: Payer: Medicare HMO | Attending: Internal Medicine

## 2019-04-23 DIAGNOSIS — Z23 Encounter for immunization: Secondary | ICD-10-CM

## 2019-04-23 NOTE — Progress Notes (Signed)
   Covid-19 Vaccination Clinic  Name:  [name]    MRN: [id number] DOB: 1950/02/13  04/23/2019  [name] was observed post Covid-19 immunization for 15 minutes without incident. She was provided with Vaccine Information Sheet and instruction to access the V-Safe system.   [name] was instructed to call 911 with any severe reactions post vaccine: Marland Kitchen Difficulty breathing  . Swelling of face and throat  . A fast heartbeat  . A bad rash all over body  . Dizziness and weakness   Immunizations Administered    Name Date Dose VIS Date Route   Pfizer COVID-19 Vaccine 04/23/2019  1:44 PM 0.3 mL 01/03/2019 Intramuscular   Manufacturer: [employer]   Lot: [id number]   [place]: [id number]

## 2019-05-05 DIAGNOSIS — Z0100 Encounter for examination of eyes and vision without abnormal findings: Secondary | ICD-10-CM | POA: Diagnosis not present

## 2019-05-29 DIAGNOSIS — I10 Essential (primary) hypertension: Secondary | ICD-10-CM | POA: Diagnosis not present

## 2019-05-29 DIAGNOSIS — R69 Illness, unspecified: Secondary | ICD-10-CM | POA: Diagnosis not present

## 2019-05-29 DIAGNOSIS — E7800 Pure hypercholesterolemia, unspecified: Secondary | ICD-10-CM | POA: Diagnosis not present

## 2019-05-29 DIAGNOSIS — G5793 Unspecified mononeuropathy of bilateral lower limbs: Secondary | ICD-10-CM | POA: Diagnosis not present

## 2019-05-29 DIAGNOSIS — K219 Gastro-esophageal reflux disease without esophagitis: Secondary | ICD-10-CM | POA: Diagnosis not present

## 2019-05-29 DIAGNOSIS — R569 Unspecified convulsions: Secondary | ICD-10-CM | POA: Diagnosis not present

## 2019-05-29 DIAGNOSIS — M533 Sacrococcygeal disorders, not elsewhere classified: Secondary | ICD-10-CM | POA: Diagnosis not present

## 2019-05-29 DIAGNOSIS — Z79899 Other long term (current) drug therapy: Secondary | ICD-10-CM | POA: Diagnosis not present

## 2019-05-29 DIAGNOSIS — Z0001 Encounter for general adult medical examination with abnormal findings: Secondary | ICD-10-CM | POA: Diagnosis not present

## 2019-05-30 ENCOUNTER — Other Ambulatory Visit: Payer: Self-pay | Admitting: Family Medicine

## 2019-05-30 DIAGNOSIS — Z1231 Encounter for screening mammogram for malignant neoplasm of breast: Secondary | ICD-10-CM

## 2019-06-13 ENCOUNTER — Other Ambulatory Visit: Payer: Self-pay

## 2019-06-13 ENCOUNTER — Ambulatory Visit
Admission: RE | Admit: 2019-06-13 | Discharge: 2019-06-13 | Disposition: A | Discharge status: Home / Self Care | Payer: Medicare HMO | Source: Ambulatory Visit | Attending: Family Medicine | Admitting: Family Medicine

## 2019-06-13 DIAGNOSIS — Z1231 Encounter for screening mammogram for malignant neoplasm of breast: Secondary | ICD-10-CM | POA: Diagnosis not present

## 2019-07-07 ENCOUNTER — Inpatient Hospital Stay (HOSPITAL_COMMUNITY): Payer: Medicare HMO

## 2019-07-07 ENCOUNTER — Encounter (HOSPITAL_COMMUNITY): Payer: Self-pay

## 2019-07-07 ENCOUNTER — Inpatient Hospital Stay (HOSPITAL_COMMUNITY)
Admission: EM | Admit: 2019-07-07 | Discharge: 2019-07-14 | DRG: 439 | Disposition: A | Discharge status: Home / Self Care | Payer: Medicare HMO | Attending: Internal Medicine | Admitting: Internal Medicine

## 2019-07-07 ENCOUNTER — Emergency Department (HOSPITAL_COMMUNITY): Payer: Medicare HMO

## 2019-07-07 DIAGNOSIS — E861 Hypovolemia: Secondary | ICD-10-CM | POA: Diagnosis present

## 2019-07-07 DIAGNOSIS — M1990 Unspecified osteoarthritis, unspecified site: Secondary | ICD-10-CM | POA: Diagnosis present

## 2019-07-07 DIAGNOSIS — E871 Hypo-osmolality and hyponatremia: Secondary | ICD-10-CM | POA: Diagnosis present

## 2019-07-07 DIAGNOSIS — Z590 Homelessness: Secondary | ICD-10-CM

## 2019-07-07 DIAGNOSIS — I10 Essential (primary) hypertension: Secondary | ICD-10-CM | POA: Diagnosis present

## 2019-07-07 DIAGNOSIS — I671 Cerebral aneurysm, nonruptured: Secondary | ICD-10-CM | POA: Diagnosis present

## 2019-07-07 DIAGNOSIS — K219 Gastro-esophageal reflux disease without esophagitis: Secondary | ICD-10-CM | POA: Diagnosis present

## 2019-07-07 DIAGNOSIS — K529 Noninfective gastroenteritis and colitis, unspecified: Secondary | ICD-10-CM

## 2019-07-07 DIAGNOSIS — Z90710 Acquired absence of both cervix and uterus: Secondary | ICD-10-CM

## 2019-07-07 DIAGNOSIS — R319 Hematuria, unspecified: Secondary | ICD-10-CM

## 2019-07-07 DIAGNOSIS — E860 Dehydration: Secondary | ICD-10-CM | POA: Diagnosis present

## 2019-07-07 DIAGNOSIS — G4700 Insomnia, unspecified: Secondary | ICD-10-CM | POA: Diagnosis present

## 2019-07-07 DIAGNOSIS — F419 Anxiety disorder, unspecified: Secondary | ICD-10-CM | POA: Diagnosis present

## 2019-07-07 DIAGNOSIS — Z791 Long term (current) use of non-steroidal anti-inflammatories (NSAID): Secondary | ICD-10-CM

## 2019-07-07 DIAGNOSIS — R69 Illness, unspecified: Secondary | ICD-10-CM | POA: Diagnosis not present

## 2019-07-07 DIAGNOSIS — K760 Fatty (change of) liver, not elsewhere classified: Secondary | ICD-10-CM | POA: Diagnosis not present

## 2019-07-07 DIAGNOSIS — R45851 Suicidal ideations: Secondary | ICD-10-CM | POA: Diagnosis present

## 2019-07-07 DIAGNOSIS — M797 Fibromyalgia: Secondary | ICD-10-CM | POA: Diagnosis present

## 2019-07-07 DIAGNOSIS — T502X5A Adverse effect of carbonic-anhydrase inhibitors, benzothiadiazides and other diuretics, initial encounter: Secondary | ICD-10-CM | POA: Diagnosis present

## 2019-07-07 DIAGNOSIS — F4321 Adjustment disorder with depressed mood: Secondary | ICD-10-CM

## 2019-07-07 DIAGNOSIS — R110 Nausea: Secondary | ICD-10-CM | POA: Diagnosis not present

## 2019-07-07 DIAGNOSIS — G40909 Epilepsy, unspecified, not intractable, without status epilepticus: Secondary | ICD-10-CM | POA: Diagnosis present

## 2019-07-07 DIAGNOSIS — Z20822 Contact with and (suspected) exposure to covid-19: Secondary | ICD-10-CM | POA: Diagnosis not present

## 2019-07-07 DIAGNOSIS — R569 Unspecified convulsions: Secondary | ICD-10-CM | POA: Diagnosis present

## 2019-07-07 DIAGNOSIS — E785 Hyperlipidemia, unspecified: Secondary | ICD-10-CM | POA: Diagnosis present

## 2019-07-07 DIAGNOSIS — Z8249 Family history of ischemic heart disease and other diseases of the circulatory system: Secondary | ICD-10-CM

## 2019-07-07 DIAGNOSIS — F32A Depression, unspecified: Secondary | ICD-10-CM

## 2019-07-07 DIAGNOSIS — F329 Major depressive disorder, single episode, unspecified: Secondary | ICD-10-CM | POA: Diagnosis not present

## 2019-07-07 DIAGNOSIS — Z79899 Other long term (current) drug therapy: Secondary | ICD-10-CM

## 2019-07-07 DIAGNOSIS — Z87891 Personal history of nicotine dependence: Secondary | ICD-10-CM | POA: Diagnosis not present

## 2019-07-07 DIAGNOSIS — Z800 Family history of malignant neoplasm of digestive organs: Secondary | ICD-10-CM

## 2019-07-07 DIAGNOSIS — N179 Acute kidney failure, unspecified: Secondary | ICD-10-CM

## 2019-07-07 DIAGNOSIS — N390 Urinary tract infection, site not specified: Secondary | ICD-10-CM | POA: Diagnosis not present

## 2019-07-07 DIAGNOSIS — Z915 Personal history of self-harm: Secondary | ICD-10-CM

## 2019-07-07 DIAGNOSIS — K859 Acute pancreatitis without necrosis or infection, unspecified: Principal | ICD-10-CM | POA: Diagnosis present

## 2019-07-07 DIAGNOSIS — B9620 Unspecified Escherichia coli [E. coli] as the cause of diseases classified elsewhere: Secondary | ICD-10-CM | POA: Diagnosis not present

## 2019-07-07 DIAGNOSIS — Z8261 Family history of arthritis: Secondary | ICD-10-CM

## 2019-07-07 DIAGNOSIS — Z888 Allergy status to other drugs, medicaments and biological substances status: Secondary | ICD-10-CM

## 2019-07-07 DIAGNOSIS — Z87440 Personal history of urinary (tract) infections: Secondary | ICD-10-CM | POA: Diagnosis not present

## 2019-07-07 LAB — URINALYSIS, ROUTINE W REFLEX MICROSCOPIC
Bilirubin Urine: NEGATIVE
Glucose, UA: NEGATIVE mg/dL
Ketones, ur: NEGATIVE mg/dL
Nitrite: NEGATIVE
Protein, ur: NEGATIVE mg/dL
Specific Gravity, Urine: 1.016 (ref 1.005–1.030)
WBC, UA: >50 WBC/hpf — ABNORMAL HIGH (ref 0–5)
pH: 6 (ref 5.0–8.0)

## 2019-07-07 LAB — COMPREHENSIVE METABOLIC PANEL
ALT: 23 U/L (ref 0–44)
AST: 28 U/L (ref 15–41)
Albumin: 4.2 g/dL (ref 3.5–5.0)
Alkaline Phosphatase: 44 U/L (ref 38–126)
Anion gap: 11 (ref 5–15)
BUN: 23 mg/dL (ref 8–23)
CO2: 24 mmol/L (ref 22–32)
Calcium: 9.7 mg/dL (ref 8.9–10.3)
Chloride: 95 mmol/L — ABNORMAL LOW (ref 98–111)
Creatinine, Ser: 1.37 mg/dL — ABNORMAL HIGH (ref 0.44–1.00)
GFR calc Af Amer: 45 mL/min — ABNORMAL LOW (ref >60)
GFR calc non Af Amer: 39 mL/min — ABNORMAL LOW (ref >60)
Glucose, Bld: 108 mg/dL — ABNORMAL HIGH (ref 70–99)
Potassium: 4 mmol/L (ref 3.5–5.1)
Sodium: 130 mmol/L — ABNORMAL LOW (ref 135–145)
Total Bilirubin: 0.6 mg/dL (ref 0.3–1.2)
Total Protein: 7.2 g/dL (ref 6.5–8.1)

## 2019-07-07 LAB — CBC
HCT: 40.4 % (ref 36.0–46.0)
Hemoglobin: 13.7 g/dL (ref 12.0–15.0)
MCH: 29.7 pg (ref 26.0–34.0)
MCHC: 33.9 g/dL (ref 30.0–36.0)
MCV: 87.6 fL (ref 80.0–100.0)
Platelets: 328 10*3/uL (ref 150–400)
RBC: 4.61 MIL/uL (ref 3.87–5.11)
RDW: 12.8 % (ref 11.5–15.5)
WBC: 9.9 10*3/uL (ref 4.0–10.5)
nRBC: 0 % (ref 0.0–0.2)

## 2019-07-07 LAB — LIPASE, BLOOD: Lipase: 26 U/L (ref 11–51)

## 2019-07-07 LAB — SARS CORONAVIRUS 2 BY RT PCR (HOSPITAL ORDER, PERFORMED IN ~~LOC~~ HOSPITAL LAB): SARS Coronavirus 2: NEGATIVE

## 2019-07-07 MED ORDER — SODIUM CHLORIDE 0.9% FLUSH: 3.0000 mL | Freq: Once | INTRAVENOUS | Status: DC

## 2019-07-07 MED ORDER — METOPROLOL TARTRATE 50 MG PO TABS
50.0000 mg | ORAL_TABLET | Freq: Two times a day (BID) | ORAL | Status: DC
  Administered 2019-07-07 – 2019-07-14 (×14): 50 mg via ORAL
  Filled 2019-07-07 (×10): qty 1
  Filled 2019-07-07: qty 2
  Filled 2019-07-07 (×3): qty 1

## 2019-07-07 MED ORDER — SODIUM CHLORIDE 0.9 % IV SOLN: INTRAVENOUS | Status: DC

## 2019-07-07 MED ORDER — ACETAMINOPHEN 650 MG RE SUPP: 650.0000 mg | Freq: Four times a day (QID) | RECTAL | Status: DC | PRN

## 2019-07-07 MED ORDER — LEVETIRACETAM 500 MG PO TABS
500.0000 mg | ORAL_TABLET | Freq: Two times a day (BID) | ORAL | Status: DC
  Administered 2019-07-08 – 2019-07-14 (×13): 500 mg via ORAL
  Filled 2019-07-07 (×13): qty 1

## 2019-07-07 MED ORDER — ACETAMINOPHEN 325 MG PO TABS
650.0000 mg | ORAL_TABLET | Freq: Four times a day (QID) | ORAL | Status: DC | PRN
  Administered 2019-07-08 – 2019-07-14 (×6): 650 mg via ORAL
  Filled 2019-07-07 (×6): qty 2

## 2019-07-07 MED ORDER — MORPHINE SULFATE (PF) 2 MG/ML IV SOLN
1.0000 mg | INTRAVENOUS | Status: DC | PRN
  Administered 2019-07-08 – 2019-07-12 (×6): 1 mg via INTRAVENOUS
  Filled 2019-07-07 (×6): qty 1

## 2019-07-07 MED ORDER — LEVETIRACETAM 500 MG PO TABS
500.0000 mg | ORAL_TABLET | Freq: Once | ORAL | Status: AC
  Administered 2019-07-07: 500 mg via ORAL
  Filled 2019-07-07: qty 1

## 2019-07-07 MED ORDER — SODIUM CHLORIDE 0.9 % IV BOLUS
1000.0000 mL | Freq: Once | INTRAVENOUS | Status: AC
  Administered 2019-07-07: 1000 mL via INTRAVENOUS

## 2019-07-07 MED ORDER — SODIUM CHLORIDE 0.9 % IV SOLN
1.0000 g | INTRAVENOUS | Status: DC
  Administered 2019-07-08 – 2019-07-09 (×2): 1 g via INTRAVENOUS
  Filled 2019-07-07 (×2): qty 10

## 2019-07-07 MED ORDER — IOHEXOL 300 MG/ML  SOLN
100.0000 mL | Freq: Once | INTRAMUSCULAR | Status: AC | PRN
  Administered 2019-07-07: 100 mL via INTRAVENOUS

## 2019-07-07 MED ORDER — FENOFIBRATE 54 MG PO TABS
54.0000 mg | ORAL_TABLET | Freq: Every day | ORAL | Status: DC
  Administered 2019-07-08 – 2019-07-14 (×7): 54 mg via ORAL
  Filled 2019-07-07 (×7): qty 1

## 2019-07-07 MED ORDER — ENOXAPARIN SODIUM 40 MG/0.4ML ~~LOC~~ SOLN
40.0000 mg | SUBCUTANEOUS | Status: DC
  Administered 2019-07-07 – 2019-07-08 (×2): 40 mg via SUBCUTANEOUS
  Filled 2019-07-07 (×6): qty 0.4

## 2019-07-07 MED ORDER — ONDANSETRON HCL 4 MG/2ML IJ SOLN: 4.0000 mg | Freq: Four times a day (QID) | INTRAMUSCULAR | Status: DC | PRN

## 2019-07-07 MED ORDER — PANTOPRAZOLE SODIUM 20 MG PO TBEC
20.0000 mg | DELAYED_RELEASE_TABLET | Freq: Every day | ORAL | Status: DC
  Administered 2019-07-08 – 2019-07-14 (×7): 20 mg via ORAL
  Filled 2019-07-07 (×7): qty 1

## 2019-07-07 MED ORDER — AMLODIPINE BESYLATE 5 MG PO TABS
5.0000 mg | ORAL_TABLET | Freq: Every day | ORAL | Status: DC
  Administered 2019-07-08 – 2019-07-14 (×7): 5 mg via ORAL
  Filled 2019-07-07 (×7): qty 1

## 2019-07-07 MED ORDER — CLONIDINE HCL 0.1 MG PO TABS
0.1000 mg | ORAL_TABLET | Freq: Two times a day (BID) | ORAL | Status: DC
  Administered 2019-07-07 – 2019-07-14 (×14): 0.1 mg via ORAL
  Filled 2019-07-07 (×16): qty 1

## 2019-07-07 MED ORDER — SODIUM CHLORIDE 0.9 % IV SOLN
1.0000 g | Freq: Once | INTRAVENOUS | Status: AC
  Administered 2019-07-07: 1 g via INTRAVENOUS
  Filled 2019-07-07: qty 10

## 2019-07-07 NOTE — ED Triage Notes (Signed)
Pt arrives to ED w/ c/o intermittent nausea since 2018. Pt states she had a colonoscopy in 2019 and no explanation of symptoms, states symptoms have gotten worse. Pt also states she would like to talk to MD about depression.

## 2019-07-07 NOTE — ED Notes (Signed)
Pt. Stated, Ive had watery stool going on for 2-3 years. Its just progressively getting worse.

## 2019-07-07 NOTE — ED Notes (Signed)
Patient transported to CT 

## 2019-07-07 NOTE — ED Notes (Signed)
Transported to [facility].

## 2019-07-07 NOTE — H&P (Signed)
History and Physical    [name] POE:[id number] DOB: 1951-01-16 DOA: 07/07/2019  PCP: Lujean Amel, MD Patient coming from: Home  Chief Complaint: Multiple complaints  HPI: [name] is a 69 y.o. female with medical history significant of seizure disorder, hypertension, hyperlipidemia, GERD, depression with admission for prior suicide attempt in 2012, anxiety, fibromyalgia, cerebral aneurysm status post repair, history of abdominal hysterectomy presenting with complaints of nausea without vomiting, abdominal pain, and intermittent episodes of watery diarrhea for several months.  Abdominal pain is epigastric, right-sided, and suprapubic.  It is 5 out of 10 in intensity.  Also endorsing dysuria and urinary frequency/urgency.  Reports history of previous UTIs.  Patient states she has been feeling depressed as she does not have any close family members and is having problems with her current landlord.  Both her son and her mother have passed away.  She feels very lonely and states "sometimes I wonder why I am here."  She does not see a psychiatrist.  Denies history of ethanol use.  She has been vaccinated for Covid.  ED Course: Afebrile and hemodynamically stable.  Labs showing no leukocytosis.  Sodium 130.  Creatinine 1.3, baseline 0.9.  Lipase and LFTs normal.  UA with large amount of leukocytes, greater than 50 WBCs, and many bacteria.  Urine culture pending.  SARS-CoV-2 PCR test pending.  CT abdomen pelvis showing a focal inflammatory process within the left upper quadrant which appears to be centered about the tail of the pancreas and suggests possible acute pancreatitis.  No other acute intra-abdominal or pelvic abnormality.  Patient was given ceftriaxone and 1 L normal saline bolus.  Review of Systems:  All systems reviewed and apart from history of presenting illness, are negative.  Past Medical History:  Diagnosis Date  . Aneurysm ([place])    brain  . Anxiety   . Arthritis   .  Depression   . Fibromyalgia   . GERD (gastroesophageal reflux disease)   . Hyperlipidemia    pt. thought she was placed on medication for cholesterol but does not know for sure.  . Hypertension   . Insomnia   . Seizures ([place])    [id number]    Past Surgical History:  Procedure Laterality Date  . ABDOMINAL HYSTERECTOMY    . BREAST CYST EXCISION Right   . CEREBRAL ANEURYSM REPAIR    . CEREBRAL ANEURYSM REPAIR     clipped   . COLONOSCOPY     12+ years ago [place]  . POLYPECTOMY       reports that she quit smoking about 36 years ago. She has never used smokeless tobacco. She reports that she does not drink alcohol and does not use drugs.  Allergies  Allergen Reactions  . Barium-Containing Compounds Nausea And Vomiting  . Red Dye Nausea And Vomiting    Family History  Problem Relation Age of Onset  . Heart disease Mother   . Heart disease Father   . Cancer Brother   . Arthritis Brother   . Colon cancer Paternal Grandfather   . Esophageal cancer Son   . Colon polyps Neg Hx   . Rectal cancer Neg Hx   . Stomach cancer Neg Hx     Prior to Admission medications   Medication Sig Start Date End Date Taking? Authorizing Provider  acetaminophen (TYLENOL) 500 MG tablet Take 1,000 mg by mouth 2 (two) times daily.    [provider]  AMLODIPINE BESYLATE PO Take 5 mg by mouth daily.  [provider]  baclofen (LIORESAL) 10 MG tablet Take 10 mg by mouth 3 (three) times daily as needed for muscle spasms.    [provider]  cloNIDine (CATAPRES) 0.1 MG tablet TAKE 1 TABLET(0.1 MG) BY MOUTH TWICE DAILY Patient taking differently: Take 0.1 mg by mouth every evening.  01/22/17   Josue Hector, MD  fenofibrate 54 MG tablet Take 54 mg by mouth every morning.  03/22/17   [provider]  hydrochlorothiazide (HYDRODIURIL) 25 MG tablet Take 25 mg by mouth every evening.    [provider]  levETIRAcetam (KEPPRA) 500 MG tablet Take 1 tablet (500 mg  total) by mouth 2 (two) times daily. 06/02/10   Judithann Sheen, MD  LORazepam (ATIVAN) 0.5 MG tablet Take 0.5 mg by mouth every evening.     [provider]  losartan-hydrochlorothiazide (HYZAAR) 100-25 MG tablet Take 1 tablet by mouth daily.    [provider]  metoprolol tartrate (LOPRESSOR) 50 MG tablet Take 50 mg by mouth 2 (two) times daily.     [provider]  naproxen sodium (ALEVE) 220 MG tablet Take 440 mg by mouth 2 (two) times daily.    [provider]  omeprazole (PRILOSEC) 20 MG capsule Take 20 mg by mouth every evening.     [provider]    Physical Exam: Vitals:   07/07/19 2130 07/07/19 2145 07/07/19 2200 07/07/19 2215  BP: 130/83 (!) 146/131 (!) 121/57 (!) 113/45  Pulse: 81 84 71 72  Resp:      Temp:      TempSrc:      SpO2: 99% 100% 99% 95%  Weight:      Height:        Physical Exam Constitutional:      General: She is not in acute distress.    Appearance: Normal appearance. She is not ill-appearing, toxic-appearing or diaphoretic.  HENT:     Head: Normocephalic.     Mouth/Throat:     Mouth: Mucous membranes are moist.     Pharynx: No oropharyngeal exudate or posterior oropharyngeal erythema.  Eyes:     General:        Right eye: No discharge.        Left eye: No discharge.     Extraocular Movements: Extraocular movements intact.  Cardiovascular:     Rate and Rhythm: Normal rate and regular rhythm.     Pulses: Normal pulses.  Pulmonary:     Effort: Pulmonary effort is normal. No respiratory distress.     Breath sounds: Normal breath sounds. No wheezing or rales.  Abdominal:     General: Bowel sounds are normal. There is no distension.     Palpations: Abdomen is soft.     Tenderness: There is abdominal tenderness. There is no guarding or rebound.     Comments: Epigastrium, right upper quadrant, and suprapubic areas mildly tender to palpation  Musculoskeletal:        General: No swelling.     Cervical  back: Normal range of motion and neck supple.  Skin:    General: Skin is warm and dry.  Neurological:     Mental Status: She is alert and oriented to person, place, and time.     Labs on Admission: I have personally reviewed following labs and imaging studies  CBC: Recent Labs  Lab 07/07/19 1523  WBC 9.9  HGB 13.7  HCT 40.4  MCV 87.6  PLT 132   Basic Metabolic Panel: Recent  Labs  Lab 07/07/19 1523  NA 130*  K 4.0  CL 95*  CO2 24  GLUCOSE 108*  BUN 23  CREATININE 1.37*  CALCIUM 9.7   GFR: Estimated Creatinine Clearance: 40 mL/min (A) (by C-G formula based on SCr of 1.37 mg/dL (H)). Liver Function Tests: Recent Labs  Lab 07/07/19 1523  AST 28  ALT 23  ALKPHOS 44  BILITOT 0.6  PROT 7.2  ALBUMIN 4.2   Recent Labs  Lab 07/07/19 1523  LIPASE 26   No results for input(s): AMMONIA in the last 168 hours. Coagulation Profile: No results for input(s): INR, PROTIME in the last 168 hours. Cardiac Enzymes: No results for input(s): CKTOTAL, CKMB, CKMBINDEX, TROPONINI in the last 168 hours. BNP (last 3 results) No results for input(s): PROBNP in the last 8760 hours. HbA1C: No results for input(s): HGBA1C in the last 72 hours. CBG: No results for input(s): GLUCAP in the last 168 hours. Lipid Profile: No results for input(s): CHOL, HDL, LDLCALC, TRIG, CHOLHDL, LDLDIRECT in the last 72 hours. Thyroid Function Tests: No results for input(s): TSH, T4TOTAL, FREET4, T3FREE, THYROIDAB in the last 72 hours. Anemia Panel: No results for input(s): VITAMINB12, FOLATE, FERRITIN, TIBC, IRON, RETICCTPCT in the last 72 hours. Urine analysis:    Component Value Date/Time   COLORURINE YELLOW 07/07/2019 1944   APPEARANCEUR HAZY (A) 07/07/2019 1944   LABSPEC 1.016 07/07/2019 1944   PHURINE 6.0 07/07/2019 1944   GLUCOSEU NEGATIVE 07/07/2019 1944   HGBUR LARGE (A) 07/07/2019 1944   BILIRUBINUR NEGATIVE 07/07/2019 1944   KETONESUR NEGATIVE 07/07/2019 1944   PROTEINUR NEGATIVE  07/07/2019 1944   UROBILINOGEN 0.2 12/23/2010 1602   NITRITE NEGATIVE 07/07/2019 1944   LEUKOCYTESUR LARGE (A) 07/07/2019 1944    Radiological Exams on Admission: CT ABDOMEN PELVIS W CONTRAST  Result Date: 07/07/2019 CLINICAL DATA:  Intermittent nausea EXAM: CT ABDOMEN AND PELVIS WITH CONTRAST TECHNIQUE: Multidetector CT imaging of the abdomen and pelvis was performed using the standard protocol following bolus administration of intravenous contrast. CONTRAST:  80 mL OMNIPAQUE IOHEXOL 300 MG/ML  SOLN COMPARISON:  None. FINDINGS: Lower chest: Lung bases demonstrate no acute consolidation or pleural effusion. Small blebs or cysts in the posterior left lung base with scarring. Small hiatal hernia. Hepatobiliary: No focal liver abnormality is seen. No gallstones, gallbladder wall thickening, or biliary dilatation. Pancreas: Edema and soft tissue stranding about the tail of the pancreas. No ductal dilatation Spleen: Normal in size without focal abnormality. Adrenals/Urinary Tract: Adrenal glands are unremarkable. Kidneys are normal, without renal calculi, focal lesion, or hydronephrosis. Bladder is unremarkable. Stomach/Bowel: Stomach is within normal limits. Appendix appears normal. No evidence of bowel wall thickening, distention, or inflammatory changes. Vascular/Lymphatic: Mild aortic atherosclerosis without aneurysm. No suspicious adenopathy Reproductive: Status post hysterectomy. No adnexal masses. Other: Negative for free air or free fluid. Musculoskeletal: No acute or significant osseous findings. IMPRESSION: 1. Focal inflammatory process within the left upper quadrant, appears to be centered about the tail of the pancreas and suggests possible acute pancreatitis. Recommend correlation with appropriate enzymes. 2. Otherwise no CT evidence for acute intra-abdominal or pelvic abnormality. Electronically Signed   By: Donavan Foil M.D.   On: 07/07/2019 19:52    Assessment/Plan Principal Problem:    Pancreatitis Active Problems:   Chronic diarrhea   UTI (urinary tract infection)   AKI (acute kidney injury) ([place])   Depression with suicidal ideation   Possible early pancreatitis: Patient presented with complaints of nausea and epigastric/right upper quadrant abdominal pain.  Lipase and LFTs normal.  However, CT with findings concerning for possible acute pancreatitis.  Patient denies history of ethanol use. -IV fluid hydration, antiemetic as needed, and pain management.  Repeat lipase and LFTs in a.m. Order right upper quadrant ultrasound to assess for possible gallstones.  Clear liquid diet, advance as tolerated.  Chronic diarrhea: No fever or leukocytosis. -GI pathogen panel and enteric precautions.  Consider GI evaluation.  UTI: No fever, leukocytosis, or signs of sepsis. UA with large amount of leukocytes, greater than 50 WBCs, and many bacteria. -Ceftriaxone.  Urine culture pending.  AKI: Creatinine 1.3, baseline 0.9.  Likely due to decreased p.o. intake, ongoing diarrhea, and home diuretic plus ARB use. -IV fluid hydration.  Repeat BMP in a.m.  Avoid nephrotoxic agents.  Hold diuretic and ARB.  Acute on chronic hyponatremia: Sodium 130, hyponatremia seen on previous labs as well.  Possibly related to decreased p.o. intake from nausea and chronic diarrhea.  In addition, takes hydrochlorothiazide at home which is also likely contributing. -IV fluid hydration.  Check serum osmolarity, urine osmolarity, TSH, and cortisol levels.  Repeat BMP in a.m.  Hold diuretic.  Depression with suicidal ideation: Does have prior history of suicide attempt in 2012 which required hospitalization.  At present, she is endorsing depression and suicidal thoughts due to severe loneliness as her close family members have passed away and she does not have social support.  In addition, takes trazodone at home which has a black box warning for suicidal thoughts and behaviors. -Hold trazodone at this time.  Psych  consult has been placed.  Follow suicide precautions.  Seizure disorder: Stable. -Continue home Keppra  Hypertension: Normotensive at present. -Continue on amlodipine, clonidine, and metoprolol.  Hold hydrochlorothiazide and losartan at this time.  DVT prophylaxis: Lovenox Code Status: Full code Family Communication: No family available. Disposition Plan: Status is: Inpatient  Remains inpatient appropriate because:Unsafe d/c plan and IV treatments appropriate due to intensity of illness or inability to take PO   Dispo: The patient is from: Home              Anticipated d/c is to: Possibly to inpatient psych unit, pending psych evaluation              Anticipated d/c date is: 2 days              Patient currently is not medically stable to d/c.  The medical decision making on this patient was of high complexity and the patient is at high risk for clinical deterioration, therefore this is a level 3 visit.  Shela Leff MD [facility]  If 7PM-7AM, please contact night-coverage [email or web address]  07/07/2019, 10:30 PM

## 2019-07-07 NOTE — ED Provider Notes (Signed)
[place] EMERGENCY DEPARTMENT Provider Note   CSN: [id number] Arrival date & time: 07/07/19  1425     History Chief Complaint  Patient presents with  . Nausea    [name] is a 69 y.o. female history includes aneurysm, anxiety, depression, fibromyalgia, GERD, hypertension, hyperlipidemia, insomnia, seizures.  Patient presents today for multiple concerns.  Patient reports that she has had intermittent abdominal pain, nausea and diarrhea for the past 3 years.  She reports she has been evaluated by her primary care doctor and gastroenterologist in the past for this and had a colonoscopy which was unrevealing.    Patient reports that over the past 2 days or so her symptoms have acutely gotten worse she describes a crampy lower abdominal pain anytime that she eats, she reports she has not been able to eat secondary to pain and nausea.  Pain is moderate intensity nonradiating no clear alleviating factors gradually resolves with time.  Associated with watery and nonbloody diarrhea.  She reports nausea without vomiting.  Patient additionally reports depression, she reports that she only has 1 friend and no living family.  She reports that she is lonely and would like to speak with somebody about her depression.  She denies any suicidal or homicidal ideations, hallucinations, self injury, or ingestions.  Denies fever/chills, headache, neck stiffness, chest pain/shortness of breath, vomiting, hematochezia, melena, dysuria/hematuria, vaginal bleeding/discharge or any additional concerns. HPI     Past Medical History:  Diagnosis Date  . Aneurysm ([place])    brain  . Anxiety   . Arthritis   . Depression   . Fibromyalgia   . GERD (gastroesophageal reflux disease)   . Hyperlipidemia    pt. thought she was placed on medication for cholesterol but does not know for sure.  . Hypertension   . Insomnia   . Seizures ([place])    [phone]    Patient Active Problem List    Diagnosis Date Noted  . Hypertension 01/07/2011  . Suicidal thoughts 01/06/2011  . Gait instability 11/28/2010  . Hyponatremia 11/28/2010  . Ulnar neuropathy 06/03/2010  . Depression 06/03/2010  . Insomnia 06/03/2010  . Seizure ([place]) 06/03/2010  . Recurrent UTI 06/03/2010  . Arthritis 06/03/2010    Past Surgical History:  Procedure Laterality Date  . ABDOMINAL HYSTERECTOMY    . BREAST CYST EXCISION Right   . CEREBRAL ANEURYSM REPAIR    . CEREBRAL ANEURYSM REPAIR     clipped   . COLONOSCOPY     12+ years ago [place]  . POLYPECTOMY       OB History   No obstetric history on file.     Family History  Problem Relation Age of Onset  . Heart disease Mother   . Heart disease Father   . Cancer Brother   . Arthritis Brother   . Colon cancer Paternal Grandfather   . Esophageal cancer Son   . Colon polyps Neg Hx   . Rectal cancer Neg Hx   . Stomach cancer Neg Hx     Social History   Tobacco Use  . Smoking status: Former Smoker    Quit date: 1985    Years since quitting: 36.4  . Smokeless tobacco: Never Used  Substance Use Topics  . Alcohol use: No  . Drug use: No    Home Medications Prior to Admission medications   Medication Sig Start Date End Date Taking? Authorizing Provider  acetaminophen (TYLENOL) 500 MG tablet Take 1,000 mg by mouth 2 (two) times  daily.    [provider]  AMLODIPINE BESYLATE PO Take 5 mg by mouth daily.     [provider]  baclofen (LIORESAL) 10 MG tablet Take 10 mg by mouth 3 (three) times daily as needed for muscle spasms.    [provider]  cloNIDine (CATAPRES) 0.1 MG tablet TAKE 1 TABLET(0.1 MG) BY MOUTH TWICE DAILY Patient taking differently: Take 0.1 mg by mouth every evening.  01/22/17   Josue Hector, MD  fenofibrate 54 MG tablet Take 54 mg by mouth every morning.  03/22/17   [provider]  hydrochlorothiazide (HYDRODIURIL) 25 MG tablet Take 25 mg by mouth every evening.    [provider]  levETIRAcetam (KEPPRA) 500 MG tablet Take 1 tablet (500 mg total) by mouth 2 (two) times daily. 06/02/10   Judithann Sheen, MD  LORazepam (ATIVAN) 0.5 MG tablet Take 0.5 mg by mouth every evening.     [provider]  losartan-hydrochlorothiazide (HYZAAR) 100-25 MG tablet Take 1 tablet by mouth daily.    [provider]  metoprolol tartrate (LOPRESSOR) 50 MG tablet Take 50 mg by mouth 2 (two) times daily.     [provider]  naproxen sodium (ALEVE) 220 MG tablet Take 440 mg by mouth 2 (two) times daily.    [provider]  omeprazole (PRILOSEC) 20 MG capsule Take 20 mg by mouth every evening.     [provider]    Allergies    Barium-containing compounds  Review of Systems   Review of Systems Ten systems are reviewed and are negative for acute change except as noted in the HPI  Physical Exam Updated Vital Signs BP (!) 144/66 (BP Location: Right Arm)   Pulse 68   Temp 98.1 F (36.7 C) (Oral)   Resp 18   Ht 5\' 6"  (1.676 m)   Wt 79.4 kg   SpO2 97%   BMI 28.25 kg/m   Physical Exam Constitutional:      General: She is not in acute distress.    Appearance: Normal appearance. She is well-developed. She is not ill-appearing or diaphoretic.  HENT:     Head: Normocephalic and atraumatic.  Eyes:     General: Vision grossly intact. Gaze aligned appropriately.     Pupils: Pupils are equal, round, and reactive to light.  Neck:     Trachea: Trachea and phonation normal.  Pulmonary:     Effort: Pulmonary effort is normal. No respiratory distress.  Abdominal:     General: There is no distension.     Palpations: Abdomen is soft.     Tenderness: There is no abdominal tenderness. There is no guarding or rebound.  Musculoskeletal:        General: Normal range of motion.     Cervical back: Normal range of motion.  Skin:    General: Skin is warm and dry.  Neurological:     Mental Status: She is alert.     GCS: GCS eye subscore is 4. GCS  verbal subscore is 5. GCS motor subscore is 6.     Comments: Speech is clear and goal oriented, follows commands Major Cranial nerves without deficit, no facial droop Moves extremities without ataxia, coordination intact  Psychiatric:        Attention and Perception: Attention normal.        Mood and Affect: Mood is depressed.        Speech: Speech normal.        Behavior:  Behavior normal. Behavior is cooperative.        Thought Content: Thought content does not include homicidal or suicidal ideation.     ED Results / Procedures / Treatments   Labs (all labs ordered are listed, but only abnormal results are displayed) Labs Reviewed  COMPREHENSIVE METABOLIC PANEL - Abnormal; Notable for the following components:      Result Value   Sodium 130 (*)    Chloride 95 (*)    Glucose, Bld 108 (*)    Creatinine, Ser 1.37 (*)    GFR calc non Af Amer 39 (*)    GFR calc Af Amer 45 (*)    All other components within normal limits  URINALYSIS, ROUTINE W REFLEX MICROSCOPIC - Abnormal; Notable for the following components:   APPearance HAZY (*)    Hgb urine dipstick LARGE (*)    Leukocytes,Ua LARGE (*)    WBC, UA >50 (*)    Bacteria, UA MANY (*)    All other components within normal limits  URINE CULTURE  SARS CORONAVIRUS 2 BY RT PCR (HOSPITAL ORDER, [facility])  LIPASE, BLOOD  CBC    EKG None  Radiology CT ABDOMEN PELVIS W CONTRAST  Result Date: 07/07/2019 CLINICAL DATA:  Intermittent nausea EXAM: CT ABDOMEN AND PELVIS WITH CONTRAST TECHNIQUE: Multidetector CT imaging of the abdomen and pelvis was performed using the standard protocol following bolus administration of intravenous contrast. CONTRAST:  80 mL OMNIPAQUE IOHEXOL 300 MG/ML  SOLN COMPARISON:  None. FINDINGS: Lower chest: Lung bases demonstrate no acute consolidation or pleural effusion. Small blebs or cysts in the posterior left lung base with scarring. Small hiatal hernia. Hepatobiliary: No focal  liver abnormality is seen. No gallstones, gallbladder wall thickening, or biliary dilatation. Pancreas: Edema and soft tissue stranding about the tail of the pancreas. No ductal dilatation Spleen: Normal in size without focal abnormality. Adrenals/Urinary Tract: Adrenal glands are unremarkable. Kidneys are normal, without renal calculi, focal lesion, or hydronephrosis. Bladder is unremarkable. Stomach/Bowel: Stomach is within normal limits. Appendix appears normal. No evidence of bowel wall thickening, distention, or inflammatory changes. Vascular/Lymphatic: Mild aortic atherosclerosis without aneurysm. No suspicious adenopathy Reproductive: Status post hysterectomy. No adnexal masses. Other: Negative for free air or free fluid. Musculoskeletal: No acute or significant osseous findings. IMPRESSION: 1. Focal inflammatory process within the left upper quadrant, appears to be centered about the tail of the pancreas and suggests possible acute pancreatitis. Recommend correlation with appropriate enzymes. 2. Otherwise no CT evidence for acute intra-abdominal or pelvic abnormality. Electronically Signed   By: Donavan Foil M.D.   On: 07/07/2019 19:52    Procedures Procedures (including critical care time)  Medications Ordered in ED Medications  sodium chloride flush (NS) 0.9 % injection 3 mL (3 mLs Intravenous Not Given 07/07/19 1821)  cefTRIAXone (ROCEPHIN) 1 g in sodium chloride 0.9 % 100 mL IVPB (1 g Intravenous New Bag/Given 07/07/19 2047)  sodium chloride 0.9 % bolus 1,000 mL (1,000 mLs Intravenous New Bag/Given 07/07/19 1839)  levETIRAcetam (KEPPRA) tablet 500 mg (500 mg Oral Given 07/07/19 1827)  iohexol (OMNIPAQUE) 300 MG/ML solution 100 mL (100 mLs Intravenous Contrast Given 07/07/19 1925)    ED Course  I have reviewed the triage vital signs and the nursing notes.  Pertinent labs & imaging results that were available during my care of the patient were reviewed by me and considered in my medical  decision making (see chart for details).    MDM Rules/Calculators/A&P  Additional History Obtained: 1. Nursing notes from this visit. 2. No pertinent recent ED visits. 3. EMR shows patient received both Pfizer Covid vaccines in March. 4. Chart review shows patient had a behavioral health admission in 2012 after suicide attempt with Xanax and temazepam.  I ordered, reviewed and interpreted labs which include: CMP significant for hyponatremia at 130, hypochloremia at 95, creatinine of 1.37 elevated from prior at 0.95, no elevation of LFTs or gap. CBC within normal limits no leukocytosis to suggest infection and no evidence of anemia. Lipase the normal limits. Urinalysis shows hemoglobin, leukocytes, greater than 50 WBCs, many bacteria and 6-10 RBCs suggestive of urinary tract infection.  Urine culture sent.  1 g Rocephin ordered.  CT AP:   IMPRESSION:  1. Focal inflammatory process within the left upper quadrant,  appears to be centered about the tail of the pancreas and suggests  possible acute pancreatitis. Recommend correlation with appropriate  enzymes.  2. Otherwise no CT evidence for acute intra-abdominal or pelvic  abnormality.  - Discussed case with Dr. Ronnald Nian, patient has multiple problems today.  She has urinary tract infection which could be contributing to her mood and depression.  On top of that it appears that she may be developing an early pancreatitis unclear cause.  Patient is been unable to tolerate p.o. due to her nausea and pain and has subsequently developed an acute kidney injury.  She has been started on IV fluids, kept n.p.o. and started on Rocephin.  Plan of care is to discuss admission with hospitalist service.  I reevaluated patient she is agreeable to care plan.  Still awaiting TTS evaluation. - 9:23 PM: Discussed case with Dr. Marlowe Sax, patient has been admitted to hospitalist service.    Note: Portions of this report may have been  transcribed using voice recognition software. Every effort was made to ensure accuracy; however, inadvertent computerized transcription errors may still be present. Final Clinical Impression(s) / ED Diagnoses Final diagnoses:  AKI (acute kidney injury) ([place])  Acute pancreatitis without infection or necrosis, unspecified pancreatitis type  Urinary tract infection with hematuria, site unspecified  Depression, unspecified depression type    Rx / DC Orders ED Discharge Orders    None       Gari Crown 07/07/19 2124    Lennice Sites, DO 07/08/19 0023

## 2019-07-08 ENCOUNTER — Other Ambulatory Visit: Payer: Self-pay

## 2019-07-08 ENCOUNTER — Encounter (HOSPITAL_COMMUNITY): Payer: Self-pay | Admitting: Internal Medicine

## 2019-07-08 DIAGNOSIS — K529 Noninfective gastroenteritis and colitis, unspecified: Secondary | ICD-10-CM

## 2019-07-08 DIAGNOSIS — R45851 Suicidal ideations: Secondary | ICD-10-CM

## 2019-07-08 DIAGNOSIS — N179 Acute kidney failure, unspecified: Secondary | ICD-10-CM

## 2019-07-08 DIAGNOSIS — F329 Major depressive disorder, single episode, unspecified: Secondary | ICD-10-CM

## 2019-07-08 LAB — OSMOLALITY: Osmolality: 284 mOsm/kg (ref 275–295)

## 2019-07-08 LAB — COMPREHENSIVE METABOLIC PANEL
ALT: 21 U/L (ref 0–44)
AST: 25 U/L (ref 15–41)
Albumin: 3.2 g/dL — ABNORMAL LOW (ref 3.5–5.0)
Alkaline Phosphatase: 36 U/L — ABNORMAL LOW (ref 38–126)
Anion gap: 10 (ref 5–15)
BUN: 15 mg/dL (ref 8–23)
CO2: 25 mmol/L (ref 22–32)
Calcium: 8.7 mg/dL — ABNORMAL LOW (ref 8.9–10.3)
Chloride: 100 mmol/L (ref 98–111)
Creatinine, Ser: 0.88 mg/dL (ref 0.44–1.00)
GFR calc Af Amer: >60 mL/min (ref >60)
GFR calc non Af Amer: >60 mL/min (ref >60)
Glucose, Bld: 94 mg/dL (ref 70–99)
Potassium: 3.7 mmol/L (ref 3.5–5.1)
Sodium: 135 mmol/L (ref 135–145)
Total Bilirubin: 0.4 mg/dL (ref 0.3–1.2)
Total Protein: 5.9 g/dL — ABNORMAL LOW (ref 6.5–8.1)

## 2019-07-08 LAB — CORTISOL: Cortisol, Plasma: 5.1 ug/dL

## 2019-07-08 LAB — LIPASE, BLOOD: Lipase: 25 U/L (ref 11–51)

## 2019-07-08 LAB — OSMOLALITY, URINE: Osmolality, Ur: 249 mOsm/kg — ABNORMAL LOW (ref 300–900)

## 2019-07-08 LAB — TSH: TSH: 1.418 u[IU]/mL (ref 0.350–4.500)

## 2019-07-08 MED ORDER — SODIUM CHLORIDE 0.9 % IV SOLN: INTRAVENOUS | Status: AC

## 2019-07-08 MED ORDER — DOCUSATE SODIUM 100 MG PO CAPS
100.0000 mg | ORAL_CAPSULE | Freq: Two times a day (BID) | ORAL | Status: DC
  Administered 2019-07-08 – 2019-07-14 (×4): 100 mg via ORAL
  Filled 2019-07-08 (×11): qty 1

## 2019-07-08 MED ORDER — POLYETHYLENE GLYCOL 3350 17 G PO PACK: 17.0000 g | PACK | Freq: Two times a day (BID) | ORAL | Status: DC

## 2019-07-08 NOTE — Progress Notes (Signed)
PROGRESS NOTE                                                                                                                                                                                                             Patient Demographics:    [name], is a 69 y.o. female, DOB - 17-Dec-1950, DTO:[id number]  Admit date - 07/07/2019   Admitting Physician Shela Leff, MD  Outpatient Primary MD for the patient is Koirala, Dibas, MD  LOS - 1  Chief Complaint  Patient presents with  . Nausea       Brief Narrative  [name] is a 69 y.o. female with medical history significant of seizure disorder, hypertension, hyperlipidemia, GERD, depression with admission for prior suicide attempt in 2012, anxiety, fibromyalgia, cerebral aneurysm status post repair, history of abdominal hysterectomy presenting with complaints of nausea without vomiting, abdominal pain, and intermittent episodes of watery diarrhea for several months, in the ER work-up was suggestive of UTI, suicidal ideation at times along with questionable pancreatitis and she was admitted.   Subjective:    [name] today has, No headache, No chest pain, No abdominal pain - No Nausea, No new weakness tingling or numbness, No Cough - SOB.     Assessment  & Plan :     ? early pancreatitis:  Abdominal exam benign, lipase stable, no CBD stone on ultrasound, CT questions possible inflammation, she is tolerating soft diet, continue soft diet and gentle hydration currently completely pain-free, if stable this likely was a nonspecific CT finding will benefit from outpatient GI follow-up.  Chronic diarrhea: No fever or leukocytosis. GI pathogen panel is pending, continue enteric precautions until cultures are back, outpatient GI follow-up.  UTI: Complete 3 days of Rocephin and follow cultures.  AKI:  Due to dehydration likely from diarrhea, also was on diuretic and ACE, hold offending medications, resolved after gentle  hydration, continue gentle IV fluids for another 24 hours.  Acute on chronic hyponatremia:  Likely due to HCTZ resolved after normal saline.  Depression with suicidal ideation: Currently not suicidal but states these are fleeting ideas that come to her mind, bedside sitter, psych evaluation.  Seizure disorder : No acute issues on Keppra continued.   Hypertension: Normotensive at present. Continue on amlodipine, clonidine, and metoprolol.  Hold hydrochlorothiazide and losartan at this time.  Due to AKI.     Condition - Fair  Family Communication  : No family listed on facesheet.  Code Status :  Full  Consults  :  Psych  Procedures  :  CT abdomen pelvis and ultrasound of the abdomen.  Possible inflammation of the pancreas.  PUD Prophylaxis : PPI  Disposition Plan  :    Status is: Inpatient  Remains inpatient appropriate because:Unsafe d/c plan   Dispo: The patient is from: Home              Anticipated d/c is to: SNF              Anticipated d/c date is: 2 days              Patient currently is not medically stable to d/c.  Need psych clearance for discharge.   DVT Prophylaxis  :  Lovenox   Lab Results  Component Value Date   PLT 328 07/07/2019    Diet :  Diet Order            DIET SOFT Room service appropriate? Yes; Fluid consistency: Thin  Diet effective now                  Inpatient Medications Scheduled Meds: . amLODipine  5 mg Oral Daily  . cloNIDine  0.1 mg Oral BID  . docusate sodium  100 mg Oral BID  . enoxaparin (LOVENOX) injection  40 mg Subcutaneous Q24H  . fenofibrate  54 mg Oral Daily  . levETIRAcetam  500 mg Oral BID  . metoprolol tartrate  50 mg Oral BID  . pantoprazole  20 mg Oral Daily  . polyethylene glycol  17 g Oral BID  . sodium chloride flush  3 mL Intravenous Once   Continuous Infusions: . sodium chloride 50 mL/hr at 07/08/19 0825  . cefTRIAXone (ROCEPHIN)  IV     PRN Meds:.acetaminophen **OR** [DISCONTINUED]  acetaminophen, morphine injection, ondansetron (ZOFRAN) IV  Antibiotics  :   Anti-infectives (From admission, onward)   Start     Dose/Rate Route Frequency Ordered Stop   07/08/19 2030  cefTRIAXone (ROCEPHIN) 1 g in sodium chloride 0.9 % 100 mL IVPB     Discontinue     1 g 200 mL/hr over 30 Minutes Intravenous Every 24 hours 07/07/19 2225     07/07/19 2030  cefTRIAXone (ROCEPHIN) 1 g in sodium chloride 0.9 % 100 mL IVPB        1 g 200 mL/hr over 30 Minutes Intravenous  Once 07/07/19 2023 07/07/19 2130          Objective:   Vitals:   07/07/19 2312 07/08/19 0010 07/08/19 0539 07/08/19 0833  BP: 125/72 137/66 132/68 (!) 137/53  Pulse: 81 65 66 71  Resp:  19 18 20   Temp:  (!) 97.3 F (36.3 C) 98.3 F (36.8 C) 98.2 F (36.8 C)  TempSrc:  Oral Oral Oral  SpO2: 96% 99% 94% 97%  Weight:      Height:        SpO2: 97 %  Wt Readings from Last 3 Encounters:  07/07/19 79.4 kg  03/19/18 79.4 kg  05/01/17 83 kg    No intake or output data in the 24 hours ending 07/08/19 1155   Physical Exam  Awake Alert, No new F.N deficits, Normal affect [place].AT,PERRAL Supple Neck,No JVD, No cervical lymphadenopathy appriciated.  Symmetrical Chest wall movement, Good air movement bilaterally, CTAB RRR,No Gallops,Rubs or new Murmurs, No Parasternal Heave +ve B.Sounds, Abd Soft, No tenderness, No organomegaly appriciated, No rebound - guarding or rigidity. No Cyanosis, Clubbing or edema, No new Rash or bruise  Data Review:    Recent Labs  Lab 07/07/19 1523  WBC 9.9  HGB 13.7  HCT 40.4  PLT 328  MCV 87.6  MCH 29.7  MCHC 33.9  RDW 12.8    Recent Labs  Lab 07/07/19 1523 07/08/19 0313  NA 130* 135  K 4.0 3.7  CL 95* 100  CO2 24 25  GLUCOSE 108* 94  BUN 23 15  CREATININE 1.37* 0.88  CALCIUM 9.7 8.7*  AST 28 25  ALT 23 21  ALKPHOS 44 36*  BILITOT 0.6 0.4  ALBUMIN 4.2 3.2*  TSH  --  1.418    Recent Labs  Lab 07/07/19 2057  SARSCOV2NAA NEGATIVE      ------------------------------------------------------------------------------------------------------------------ No results for input(s): CHOL, HDL, LDLCALC, TRIG, CHOLHDL, LDLDIRECT in the last 72 hours.  Lab Results  Component Value Date   HGBA1C 5.4 10/13/2010   ------------------------------------------------------------------------------------------------------------------ Recent Labs    07/08/19 0313  TSH 1.418   ------------------------------------------------------------------------------------------------------------------ No results for input(s): VITAMINB12, FOLATE, FERRITIN, TIBC, IRON, RETICCTPCT in the last 72 hours.  Coagulation profile No results for input(s): INR, PROTIME in the last 168 hours.  No results for input(s): DDIMER in the last 72 hours.  Cardiac Enzymes No results for input(s): CKMB, TROPONINI, MYOGLOBIN in the last 168 hours.  Invalid input(s): CK ------------------------------------------------------------------------------------------------------------------ No results found for: BNP  Micro Results Recent Results (from the past 240 hour(s))  SARS Coronavirus 2 by RT PCR (hospital order, performed in [facility] hospital lab) Nasopharyngeal Nasopharyngeal Swab     Status: None   Collection Time: 07/07/19  8:57 PM   Specimen: Nasopharyngeal Swab  Result Value Ref Range Status   SARS Coronavirus 2 NEGATIVE NEGATIVE Final    Comment: (NOTE) SARS-CoV-2 target nucleic acids are NOT DETECTED.  The SARS-CoV-2 RNA is generally detectable in upper and lower respiratory specimens during the acute phase of infection. The lowest concentration of SARS-CoV-2 viral copies this assay can detect is 250 copies / mL. A negative result does not preclude SARS-CoV-2 infection and should not be used as the sole basis for treatment or other patient management decisions.  A negative result may occur with improper specimen collection / handling, submission of  specimen other than nasopharyngeal swab, presence of viral mutation(s) within the areas targeted by this assay, and inadequate number of viral copies (<250 copies / mL). A negative result must be combined with clinical observations, patient history, and epidemiological information.  Fact Sheet for Patients:   StrictlyIdeas.no  Fact Sheet for Healthcare Providers: BankingDealers.co.za  This test is not yet approved or  cleared by the [place] FDA and has been authorized for detection and/or diagnosis of SARS-CoV-2 by FDA under an Emergency Use Authorization (EUA).  This EUA will remain in effect (meaning this test can be used) for the duration of the COVID-19 declaration under Section 564(b)(1) of the Act, 21 U.S.C. section 360bbb-3(b)(1), unless the authorization is terminated or revoked sooner.  Performed at [facility], [place] [address] [place], [place] [address]     Radiology Reports CT ABDOMEN PELVIS W CONTRAST  Result Date: 07/07/2019 CLINICAL DATA:  Intermittent nausea EXAM: CT ABDOMEN AND PELVIS WITH CONTRAST TECHNIQUE: Multidetector CT imaging of the abdomen and pelvis was performed using the standard protocol following bolus administration of intravenous contrast. CONTRAST:  80 mL OMNIPAQUE IOHEXOL 300 MG/ML  SOLN COMPARISON:  None. FINDINGS: Lower chest: Lung bases demonstrate no acute consolidation or pleural effusion. Small blebs or cysts in the posterior left lung base  with scarring. Small hiatal hernia. Hepatobiliary: No focal liver abnormality is seen. No gallstones, gallbladder wall thickening, or biliary dilatation. Pancreas: Edema and soft tissue stranding about the tail of the pancreas. No ductal dilatation Spleen: Normal in size without focal abnormality. Adrenals/Urinary Tract: Adrenal glands are unremarkable. Kidneys are normal, without renal calculi, focal lesion, or hydronephrosis. Bladder is unremarkable.  Stomach/Bowel: Stomach is within normal limits. Appendix appears normal. No evidence of bowel wall thickening, distention, or inflammatory changes. Vascular/Lymphatic: Mild aortic atherosclerosis without aneurysm. No suspicious adenopathy Reproductive: Status post hysterectomy. No adnexal masses. Other: Negative for free air or free fluid. Musculoskeletal: No acute or significant osseous findings. IMPRESSION: 1. Focal inflammatory process within the left upper quadrant, appears to be centered about the tail of the pancreas and suggests possible acute pancreatitis. Recommend correlation with appropriate enzymes. 2. Otherwise no CT evidence for acute intra-abdominal or pelvic abnormality. Electronically Signed   By: Donavan Foil M.D.   On: 07/07/2019 19:52   MM 3D SCREEN BREAST BILATERAL  Result Date: 06/16/2019 CLINICAL DATA:  Screening. EXAM: DIGITAL SCREENING BILATERAL MAMMOGRAM WITH TOMO AND CAD COMPARISON:  None. ACR Breast Density Category b: There are scattered areas of fibroglandular density. FINDINGS: There are no findings suspicious for malignancy. Images were processed with CAD. IMPRESSION: No mammographic evidence of malignancy. A result letter of this screening mammogram will be mailed directly to the patient. RECOMMENDATION: Screening mammogram in one year. (Code:SM-B-01Y) BI-RADS CATEGORY  1: Negative. Electronically Signed   By: Marin Olp M.D.   On: 06/16/2019 14:32   US Abdomen Limited RUQ  Result Date: 07/07/2019 CLINICAL DATA:  Pancreatitis. EXAM: ULTRASOUND ABDOMEN LIMITED RIGHT UPPER QUADRANT COMPARISON:  Abdominal CT earlier today. FINDINGS: Gallbladder: Distended. No gallstones or wall thickening visualized. No sonographic Murphy sign noted by sonographer. Common bile duct: Diameter: 3-4 mm, normal. Liver: No focal lesion identified. Heterogeneously increased in parenchymal echogenicity. Portal vein is patent on color Doppler imaging with normal direction of blood flow towards the  liver. Other: None. IMPRESSION: 1. No gallstones or biliary dilatation. 2. Hepatic steatosis. Electronically Signed   By: Keith Rake M.D.   On: 07/07/2019 23:43    Time Spent in minutes  30   Lala Lund M.D on 07/08/2019 at 11:55 AM  To page go to [email or web address] - password [facility]

## 2019-07-08 NOTE — Plan of Care (Signed)

## 2019-07-08 NOTE — Evaluation (Signed)
Physical Therapy Evaluation Patient Details Name: Elias Bordner MRN: [id number] DOB: 12-10-1950 Today's Date: 07/08/2019   History of Present Illness  Patient is a 69 y/o female who presents with abdominal pain, nausea, diarrhea and reports of depression. CT abdomen- pancreatitis. PMH includes seizures, HTN, HLD, fibromyalgia, depression.  Clinical Impression  Patient lives alone and is independent for ADLs and ambulation PTA. Today, pt tolerated bed mobility, transfers and ambulation with Mod I for safety as pt reports subjective weakness from being in the bed. Noted to have mild balance deficits re: drifting in both directions with head turns but no overt LOB. Pt reports this is baseline. No hx of falls. Planning to move somewhere that has no stairs. Encouraged walking with sitter a few times daily to improve overall mobility. Pt does not require skilled therapy services as pt functioning close to baseline. Discharge from therapy.    Follow Up Recommendations No PT follow up;Supervision - Intermittent    Equipment Recommendations  None recommended by PT    Recommendations for Other Services       Precautions / Restrictions Precautions Precautions: Fall Precaution Comments: reports imbalance Restrictions Weight Bearing Restrictions: No      Mobility  Bed Mobility Overal bed mobility: Modified Independent             General bed mobility comments: No assist needed.  Transfers Overall transfer level: Modified independent Equipment used: None             General transfer comment: Stood from EOB x1, from toilet x1, transferred to chair post ambulation.  Ambulation/Gait Ambulation/Gait assistance: Supervision;Modified independent (Device/Increase time) Gait Distance (Feet): 350 Feet Assistive device: None Gait Pattern/deviations: Step-through pattern;Decreased stride length;Drifts right/left   Gait velocity interpretation: 1.31 - 2.62 ft/sec, indicative of limited  community ambulator General Gait Details: Mildly unsteady gait with head turns, noted to have some drifting but pt reports this as baseline; no overt LOB.  Stairs Stairs:  (limited due to lines)          Wheelchair Mobility    Modified Rankin (Stroke Patients Only)       Balance Overall balance assessment: Needs assistance Sitting-balance support: Feet supported;No upper extremity supported Sitting balance-Leahy Scale: Normal     Standing balance support: During functional activity Standing balance-Leahy Scale: Fair Standing balance comment: Noted to have some deviations from straight gaze path with head turns but no overt LOB.                             Pertinent Vitals/Pain Pain Assessment: No/denies pain    Home Living Family/patient expects to be discharged to:: Private residence Living Arrangements: Alone Available Help at Discharge: Friend(s) Type of Home: Other(Comment) (condo) Home Access: Stairs to enter Entrance Stairs-Rails: Right Entrance Stairs-Number of Steps: 10 Home Layout: Two level;Bed/bath upstairs Home Equipment: None      Prior Function Level of Independence: Independent         Comments: Does own ADLs, IADLS and drives. No falls reported.     Hand Dominance        Extremity/Trunk Assessment   Upper Extremity Assessment Upper Extremity Assessment: Defer to OT evaluation    Lower Extremity Assessment Lower Extremity Assessment: Overall WFL for tasks assessed (reports subjective weakness)       Communication   Communication: No difficulties  Cognition Arousal/Alertness: Awake/alert Behavior During Therapy: WFL for tasks assessed/performed Overall Cognitive Status: Within Functional Limits for tasks assessed  General Comments      Exercises     Assessment/Plan    PT Assessment Patent does not need any further PT services  PT Problem List          PT Treatment Interventions      PT Goals (Current goals can be found in the Care Plan section)  Acute Rehab PT Goals Patient Stated Goal: to go home PT Goal Formulation: All assessment and education complete, DC therapy    Frequency     Barriers to discharge        Co-evaluation               AM-PAC PT "6 Clicks" Mobility  Outcome Measure Help needed turning from your back to your side while in a flat bed without using bedrails?: None Help needed moving from lying on your back to sitting on the side of a flat bed without using bedrails?: None Help needed moving to and from a bed to a chair (including a wheelchair)?: None Help needed standing up from a chair using your arms (e.g., wheelchair or bedside chair)?: None Help needed to walk in hospital room?: None Help needed climbing 3-5 steps with a railing? : A Little 6 Click Score: 23    End of Session   Activity Tolerance: Patient tolerated treatment well Patient left: in chair;with call bell/phone within reach;with nursing/sitter in room Nurse Communication: Mobility status PT Visit Diagnosis: Muscle weakness (generalized) (M62.81)    Time: 2297-9892 PT Time Calculation (min) (ACUTE ONLY): 15 min   Charges:   PT Evaluation $PT Eval Low Complexity: 1 Low          Marisa Severin, PT, DPT Acute Rehabilitation Services Pager [phone] Office [phone]      Marguarite Arbour A Calvin 07/08/2019, 11:26 AM

## 2019-07-08 NOTE — Consult Note (Signed)
[facility] Consult   Reason for Consult:  Depression with suicidal thoughts.  Referring Physician:  Candiss Norse  Patient Identification: [name] MRN:  [id number] Principal Diagnosis: Pancreatitis Diagnosis:  Principal Problem:   Pancreatitis Active Problems:   Chronic diarrhea   UTI (urinary tract infection)   AKI (acute kidney injury) ([place])   Depression with suicidal ideation   Total Time spent with patient: 30 minutes  Subjective:   [name] is a 69 y.o. female patient admitted with complaints of nausea and vomiting, abdominal pain, intermittent episodes of watery diarrhea for several months and has been diagnosed with pancreatitis.  Psychiatry has been consulted for patient endorsing suicidal ideations with history of depression.  Upon evaluation patient reports ongoing depressive symptoms to include isolation, withdrawn, recurrent thoughts of death, suicidal thoughts with a plan, and depression.  She is unable to identify any trigger besides loneliness and no support system.  She states she has no one or reason to leave.  She states she is suicidal with a plan to walk out onto the beach.  She does have history of previous suicide attempt, that resulted in inpatient admission.  Patient does minimize this suicide attempt stating it was over 40 years ago and it was situational.  Per chart review patient had to prior suicide attempt in 2012 which she denies.  Per chart review patient was in the hospital for 3 weeks before being discharged home she denies any current outpatient therapy, current psych medications, and or substance abuse.  She continues to endorse suicidal ideations, and is unable to to contract for safety.  Safety sitter remains at bedside.  HPI:  [name] is a 69 y.o. female with medical history significant of seizure disorder, hypertension, hyperlipidemia, GERD, depression with admission for prior suicide attempt in 2012, anxiety, fibromyalgia, cerebral  aneurysm status post repair, history of abdominal hysterectomy presenting with complaints of nausea without vomiting, abdominal pain, and intermittent episodes of watery diarrhea for several months.  Abdominal pain is epigastric, right-sided, and suprapubic.  It is 5 out of 10 in intensity.  Also endorsing dysuria and urinary frequency/urgency.  Reports history of previous UTIs.  Patient states she has been feeling depressed as she does not have any close family members and is having problems with her current landlord.  Both her son and her mother have passed away.  She feels very lonely and states "sometimes I wonder why I am here."  She does not see a psychiatrist.  Denies history of ethanol use.  She has been vaccinated for Covid.  Past Psychiatric History: Depression, anxiety.  Patient no current psychotropic medication.  Patient denies any current or previous outpatient therapy.  Patient with 2 previous inpatient hospitalizations due to suicide attempts that resulted in an extended length of stay.  Risk to Self:  Yes Risk to Others:  No Prior Inpatient Therapy:  Yes Prior Outpatient Therapy:  No  Past Medical History:  Past Medical History:  Diagnosis Date  . Aneurysm ([place])    brain  . Anxiety   . Arthritis   . Depression   . Fibromyalgia   . GERD (gastroesophageal reflux disease)   . Hyperlipidemia    pt. thought she was placed on medication for cholesterol but does not know for sure.  . Hypertension   . Insomnia   . Seizures ([place])    [id number]    Past Surgical History:  Procedure Laterality Date  . ABDOMINAL HYSTERECTOMY    . BREAST CYST EXCISION Right   .  CEREBRAL ANEURYSM REPAIR    . CEREBRAL ANEURYSM REPAIR     clipped   . COLONOSCOPY     12+ years ago [place]  . POLYPECTOMY     Family History:  Family History  Problem Relation Age of Onset  . Heart disease Mother   . Heart disease Father   . Cancer Brother   . Arthritis Brother   . Colon cancer Paternal Grandfather    . Esophageal cancer Son   . Colon polyps Neg Hx   . Rectal cancer Neg Hx   . Stomach cancer Neg Hx    Family Psychiatric  History: As per patient maternal great aunt with completed suicide Social History:  Social History   Substance and Sexual Activity  Alcohol Use No     Social History   Substance and Sexual Activity  Drug Use No    Social History   Socioeconomic History  . Marital status: Divorced    Spouse name: Not on file  . Number of children: Not on file  . Years of education: Not on file  . Highest education level: Not on file  Occupational History  . Not on file  Tobacco Use  . Smoking status: Former Smoker    Quit date: 1985    Years since quitting: 36.4  . Smokeless tobacco: Never Used  Substance and Sexual Activity  . Alcohol use: No  . Drug use: No  . Sexual activity: Not Currently  Other Topics Concern  . Not on file  Social History Narrative  . Not on file   Social Determinants of Health   Financial Resource Strain:   . Difficulty of Paying Living Expenses:   Food Insecurity:   . Worried About Charity fundraiser in the Last Year:   . Arboriculturist in the Last Year:   Transportation Needs:   . Film/video editor (Medical):   Marland Kitchen Lack of Transportation (Non-Medical):   Physical Activity:   . Days of Exercise per Week:   . Minutes of Exercise per Session:   Stress:   . Feeling of Stress :   Social Connections:   . Frequency of Communication with Friends and Family:   . Frequency of Social Gatherings with Friends and Family:   . Attends Religious Services:   . Active Member of Clubs or Organizations:   . Attends Archivist Meetings:   Marland Kitchen Marital Status:    Additional Social History:    Allergies:   Allergies  Allergen Reactions  . Barium-Containing Compounds Nausea And Vomiting  . Eggs Or Egg-Derived Products Nausea And Vomiting  . Red Dye Nausea And Vomiting    Labs:  Results for orders placed or performed during  the hospital encounter of 07/07/19 (from the past 48 hour(s))  Lipase, blood     Status: None   Collection Time: 07/07/19  3:23 PM  Result Value Ref Range   Lipase 26 11 - 51 U/L    Comment: Performed at [facility], [place] [address] [place], [place] [address]  Comprehensive metabolic panel     Status: Abnormal   Collection Time: 07/07/19  3:23 PM  Result Value Ref Range   Sodium 130 (L) 135 - 145 mmol/L   Potassium 4.0 3.5 - 5.1 mmol/L   Chloride 95 (L) 98 - 111 mmol/L   CO2 24 22 - 32 mmol/L   Glucose, Bld 108 (H) 70 - 99 mg/dL    Comment: Glucose reference range  applies only to samples taken after fasting for at least 8 hours.   BUN 23 8 - 23 mg/dL   Creatinine, Ser 1.37 (H) 0.44 - 1.00 mg/dL   Calcium 9.7 8.9 - 10.3 mg/dL   Total Protein 7.2 6.5 - 8.1 g/dL   Albumin 4.2 3.5 - 5.0 g/dL   AST 28 15 - 41 U/L   ALT 23 0 - 44 U/L   Alkaline Phosphatase 44 38 - 126 U/L   Total Bilirubin 0.6 0.3 - 1.2 mg/dL   GFR calc non Af Amer 39 (L) >60 mL/min   GFR calc Af Amer 45 (L) >60 mL/min   Anion gap 11 5 - 15    Comment: Performed at [facility] [address], [place], [place] [address]  CBC     Status: None   Collection Time: 07/07/19  3:23 PM  Result Value Ref Range   WBC 9.9 4.0 - 10.5 K/uL   RBC 4.61 3.87 - 5.11 MIL/uL   Hemoglobin 13.7 12.0 - 15.0 g/dL   HCT 40.4 36 - 46 %   MCV 87.6 80.0 - 100.0 fL   MCH 29.7 26.0 - 34.0 pg   MCHC 33.9 30.0 - 36.0 g/dL   RDW 12.8 11.5 - 15.5 %   Platelets 328 150 - 400 K/uL   nRBC 0.0 0.0 - 0.2 %    Comment: Performed at [facility], [place] [address] [place], [place] [address]  Urinalysis, Routine w reflex microscopic     Status: Abnormal   Collection Time: 07/07/19  7:44 PM  Result Value Ref Range   Color, Urine YELLOW YELLOW   APPearance HAZY (A) CLEAR   Specific Gravity, Urine 1.016 1.005 - 1.030   pH 6.0 5.0 - 8.0   Glucose, UA NEGATIVE NEGATIVE mg/dL   Hgb urine dipstick LARGE (A) NEGATIVE   Bilirubin Urine  NEGATIVE NEGATIVE   Ketones, ur NEGATIVE NEGATIVE mg/dL   Protein, ur NEGATIVE NEGATIVE mg/dL   Nitrite NEGATIVE NEGATIVE   Leukocytes,Ua LARGE (A) NEGATIVE   RBC / HPF 6-10 0 - 5 RBC/hpf   WBC, UA >50 (H) 0 - 5 WBC/hpf   Bacteria, UA MANY (A) NONE SEEN   Squamous Epithelial / LPF 0-5 0 - 5   WBC Clumps PRESENT     Comment: Performed at [facility], [place] [address] [place], [place] [address]  SARS Coronavirus 2 by RT PCR (hospital order, performed in [facility] hospital lab) Nasopharyngeal Nasopharyngeal Swab     Status: None   Collection Time: 07/07/19  8:57 PM   Specimen: Nasopharyngeal Swab  Result Value Ref Range   SARS Coronavirus 2 NEGATIVE NEGATIVE    Comment: (NOTE) SARS-CoV-2 target nucleic acids are NOT DETECTED.  The SARS-CoV-2 RNA is generally detectable in upper and lower respiratory specimens during the acute phase of infection. The lowest concentration of SARS-CoV-2 viral copies this assay can detect is 250 copies / mL. A negative result does not preclude SARS-CoV-2 infection and should not be used as the sole basis for treatment or other patient management decisions.  A negative result may occur with improper specimen collection / handling, submission of specimen other than nasopharyngeal swab, presence of viral mutation(s) within the areas targeted by this assay, and inadequate number of viral copies (<250 copies / mL). A negative result must be combined with clinical observations, patient history, and epidemiological information.  Fact Sheet for Patients:   StrictlyIdeas.no  Fact Sheet for  Healthcare Providers: BankingDealers.co.za  This test is not yet approved or  cleared by the [place] and has been authorized for detection and/or diagnosis of SARS-CoV-2 by FDA under an Emergency Use Authorization (EUA).  This EUA will remain in effect (meaning this test can be used) for the duration of  the COVID-19 declaration under Section 564(b)(1) of the Act, 21 U.S.C. section 360bbb-3(b)(1), unless the authorization is terminated or revoked sooner.  Performed at [facility], [place] [address], [place], [place] [address]   Lipase, blood     Status: None   Collection Time: 07/08/19  3:13 AM  Result Value Ref Range   Lipase 25 11 - 51 U/L    Comment: Performed at [facility] [address] [place], [place] [address]  Comprehensive metabolic panel     Status: Abnormal   Collection Time: 07/08/19  3:13 AM  Result Value Ref Range   Sodium 135 135 - 145 mmol/L   Potassium 3.7 3.5 - 5.1 mmol/L   Chloride 100 98 - 111 mmol/L   CO2 25 22 - 32 mmol/L   Glucose, Bld 94 70 - 99 mg/dL    Comment: Glucose reference range applies only to samples taken after fasting for at least 8 hours.   BUN 15 8 - 23 mg/dL   Creatinine, Ser 0.88 0.44 - 1.00 mg/dL   Calcium 8.7 (L) 8.9 - 10.3 mg/dL   Total Protein 5.9 (L) 6.5 - 8.1 g/dL   Albumin 3.2 (L) 3.5 - 5.0 g/dL   AST 25 15 - 41 U/L   ALT 21 0 - 44 U/L   Alkaline Phosphatase 36 (L) 38 - 126 U/L   Total Bilirubin 0.4 0.3 - 1.2 mg/dL   GFR calc non Af Amer >60 >60 mL/min   GFR calc Af Amer >60 >60 mL/min   Anion gap 10 5 - 15    Comment: Performed at [facility], [place] [address], [place], [place] [address]  Osmolality     Status: None   Collection Time: 07/08/19  3:13 AM  Result Value Ref Range   Osmolality 284 275 - 295 mOsm/kg    Comment: Performed at [facility], [place] [address], [place], [place] [address]  TSH     Status: None   Collection Time: 07/08/19  3:13 AM  Result Value Ref Range   TSH 1.418 0.350 - 4.500 uIU/mL    Comment: Performed by a 3rd Generation assay with a functional sensitivity of <=0.01 uIU/mL. Performed at [facility], [place] [address] [place], [place] [address]   Cortisol     Status: None   Collection Time: 07/08/19  3:13 AM  Result Value Ref Range   Cortisol, Plasma 5.1 ug/dL     Comment: (NOTE) AM    6.7 - 22.6 ug/dL PM   <10.0       ug/dL Performed at [facility] [address], [place], [place] [address]   Osmolality, urine     Status: Abnormal   Collection Time: 07/08/19  4:44 AM  Result Value Ref Range   Osmolality, Ur 249 (L) 300 - 900 mOsm/kg    Comment: Performed at [facility] [address] [place], [place] [address]    Current Facility-Administered Medications  Medication Dose Route Frequency Provider Last Rate Last Admin  . 0.9 %  sodium chloride infusion   Intravenous Continuous Thurnell Lose, MD 50 mL/hr at  07/08/19 1408 Rate Verify at 07/08/19 1408  . acetaminophen (TYLENOL) tablet 650 mg  650 mg Oral Q6H PRN Shela Leff, MD   650 mg at 07/08/19 0921  . amLODipine (NORVASC) tablet 5 mg  5 mg Oral Daily Shela Leff, MD   5 mg at 07/08/19 0921  . cefTRIAXone (ROCEPHIN) 1 g in sodium chloride 0.9 % 100 mL IVPB  1 g Intravenous Q24H Shela Leff, MD      . cloNIDine (CATAPRES) tablet 0.1 mg  0.1 mg Oral BID Shela Leff, MD   0.1 mg at 07/08/19 0921  . docusate sodium (COLACE) capsule 100 mg  100 mg Oral BID Lala Lund K, MD      . enoxaparin (LOVENOX) injection 40 mg  40 mg Subcutaneous Q24H Shela Leff, MD   40 mg at 07/07/19 2243  . fenofibrate tablet 54 mg  54 mg Oral Daily Shela Leff, MD   54 mg at 07/08/19 2836  . levETIRAcetam (KEPPRA) tablet 500 mg  500 mg Oral BID Shela Leff, MD   500 mg at 07/08/19 0713  . metoprolol tartrate (LOPRESSOR) tablet 50 mg  50 mg Oral BID Shela Leff, MD   50 mg at 07/08/19 0921  . morphine 2 MG/ML injection 1 mg  1 mg Intravenous Q4H PRN Shela Leff, MD      . ondansetron (ZOFRAN) injection 4 mg  4 mg Intravenous Q6H PRN Shela Leff, MD      . pantoprazole (PROTONIX) EC tablet 20 mg  20 mg Oral Daily Shela Leff, MD   20 mg at 07/08/19 6294  . sodium chloride flush (NS) 0.9 % injection 3 mL  3 mL Intravenous Once  Lennice Sites, DO        Musculoskeletal: Strength & Muscle Tone: within normal limits Gait & Station: normal Patient leans: N/A  Psychiatric Specialty Exam: Physical Exam  Review of Systems  Blood pressure (!) 137/53, pulse 71, temperature 98.2 F (36.8 C), temperature source Oral, resp. rate 20, height 5\' 6"  (1.676 m), weight 79.4 kg, SpO2 97 %.Body mass index is 28.25 kg/m.  General Appearance: Casual  Eye Contact:  Good  Speech:  Clear and Coherent and Normal Rate  Volume:  Normal  Mood:  Anxious, Depressed, Hopeless and Worthless  Affect:  Congruent, Depressed and Flat  Thought Process:  Coherent, Linear and Descriptions of Associations: Circumstantial  Orientation:  Full (Time, Place, and Person)  Thought Content:  Logical  Suicidal Thoughts:  Yes.  with intent/plan  Homicidal Thoughts:  No  Memory:  Immediate;   Fair Recent;   Good  Judgement:  Intact  Insight:  Fair  Psychomotor Activity:  Normal  Concentration:  Concentration: Fair and Attention Span: Fair  Recall:  [employer] of Knowledge:  Fair  Language:  Fair  Akathisia:  No  Handed:  Right  AIMS (if indicated):     Assets:  Communication Skills Desire for Improvement Financial Resources/Insurance Leisure Time Physical Health  ADL's:  Intact  Cognition:  WNL  Sleep:      Patient is a 69 year old female who presented to the emergency room with acute pancreatitis.  Psychiatry was consulted due to patient endorsing depressive symptoms and suicidal ideations.  Upon evaluation patient does endorse active suicidal ideations with a plan to walk onto the beach and attempt to harm herself.  Patient has multiple suicide attempts that resulted in an extended length of stay due to depression.  Patient has also lost her mother and her son  over recent years and identifies this as triggers.  She is open to therapy and inpatient hospitalization.  Treatment Plan Summary: Plan Will recommend inpatient once medically  clear.  Patient is 69 year old female however is appropriate for regular inpatient unit is able to complete all her ADLs independently.  Patient is ambulatory with no assistance of mobility device.  Patient also appears to be well-groomed and articulate as well.  Patient with history of hyponatremia, and will avoid antidepressants that can worsen condition.  Patient has previously tried Cymbalta 30 mg which she is reports some improvement however with unable to determine if she reached a full therapeutic dose for depression and anxiety.  Will recommend starting Cymbalta 30 mg once a day and titrate as appropriate for anxiety and depression. Sodium level is within normal.  Patient is denying urinary tract symptoms, however he urinalysis was positive for large leukocytes.  Even though urinary tract is not present will recommend treating leukocytes as most facilities for psychiatric admission are going to request this be corrected.  Disposition: Recommend psychiatric Inpatient admission when medically cleared.  Suella Broad, FNP 07/08/2019 3:38 PM

## 2019-07-09 DIAGNOSIS — R319 Hematuria, unspecified: Secondary | ICD-10-CM

## 2019-07-09 DIAGNOSIS — N390 Urinary tract infection, site not specified: Secondary | ICD-10-CM

## 2019-07-09 LAB — URINE CULTURE: Culture: >=100000 — AB

## 2019-07-09 LAB — MAGNESIUM: Magnesium: 1.9 mg/dL (ref 1.7–2.4)

## 2019-07-09 MED ORDER — HYDROCORTISONE 1 % EX CREA
TOPICAL_CREAM | Freq: Two times a day (BID) | CUTANEOUS | Status: DC
  Filled 2019-07-09: qty 28
  Filled 2019-07-09: qty 28.35

## 2019-07-09 MED ORDER — LOSARTAN POTASSIUM 50 MG PO TABS
100.0000 mg | ORAL_TABLET | Freq: Every day | ORAL | Status: DC
  Administered 2019-07-09 – 2019-07-14 (×6): 100 mg via ORAL
  Filled 2019-07-09 (×6): qty 2

## 2019-07-09 MED ORDER — HYDROCHLOROTHIAZIDE 25 MG PO TABS
25.0000 mg | ORAL_TABLET | Freq: Every day | ORAL | Status: DC
  Administered 2019-07-09 – 2019-07-14 (×6): 25 mg via ORAL
  Filled 2019-07-09 (×6): qty 1

## 2019-07-09 NOTE — Plan of Care (Signed)
  Problem: Education: Goal: Knowledge of General Education information will improve Description: Including pain rating scale, medication(s)/side effects and non-pharmacologic comfort measures Outcome: Progressing   Problem: Clinical Measurements: Goal: Respiratory complications will improve Outcome: Progressing Note: On room air   Problem: Activity: Goal: Risk for activity intolerance will decrease Outcome: Progressing Note: Up walking halls. Tolerating well   Problem: Nutrition: Goal: Adequate nutrition will be maintained Outcome: Progressing   Problem: Coping: Goal: Level of anxiety will decrease Outcome: Progressing   Problem: Pain Managment: Goal: General experience of comfort will improve Outcome: Progressing Note: Treated with tylenol this morning for generalized pain with relief   Problem: Safety: Goal: Ability to remain free from injury will improve Outcome: Progressing

## 2019-07-09 NOTE — Evaluation (Signed)
Occupational Therapy Evaluation Patient Details Name: [name] MRN: [id number] DOB: Oct 19, 1950 Today's Date: 07/09/2019    History of Present Illness Patient is a 69 y/o female who presents with abdominal pain, nausea, diarrhea and reports of depression. CT abdomen- pancreatitis. PMH includes seizures, HTN, HLD, fibromyalgia, depression.   Clinical Impression   Pt is functioning independently in ADL and mobility. No OT needs.    Follow Up Recommendations  No OT follow up    Equipment Recommendations  None recommended by OT    Recommendations for Other Services       Precautions / Restrictions Precautions Precautions: None      Mobility Bed Mobility                  Transfers Overall transfer level: Independent Equipment used: None                  Balance Overall balance assessment: No apparent balance deficits (not formally assessed)                                         ADL either performed or assessed with clinical judgement   ADL Overall ADL's : Independent                                             Vision Baseline Vision/History: Wears glasses Wears Glasses: At all times Patient Visual Report: No change from baseline       Perception     Praxis      Pertinent Vitals/Pain Pain Assessment: No/denies pain     Hand Dominance Right   Extremity/Trunk Assessment Upper Extremity Assessment Upper Extremity Assessment: Overall WFL for tasks assessed   Lower Extremity Assessment Lower Extremity Assessment: Overall WFL for tasks assessed   Cervical / Trunk Assessment Cervical / Trunk Assessment: Normal   Communication Communication Communication: No difficulties   Cognition Arousal/Alertness: Awake/alert Behavior During Therapy: WFL for tasks assessed/performed Overall Cognitive Status: Within Functional Limits for tasks assessed                                     General  Comments       Exercises     Shoulder Instructions      Home Living Family/patient expects to be discharged to:: Private residence Living Arrangements: Non-relatives/Friends (roommate) Available Help at Discharge: Friend(s);Available PRN/intermittently Type of Home: Other(Comment) (condo) Home Access: Stairs to enter Entrance Stairs-Number of Steps: 10 Entrance Stairs-Rails: Right Home Layout: Two level;Bed/bath upstairs Alternate Level Stairs-Number of Steps: 1 flight Alternate Level Stairs-Rails: Right Bathroom Shower/Tub: Teacher, early years/pre: Standard     Home Equipment: None          Prior Functioning/Environment Level of Independence: Independent                 OT Problem List:        OT Treatment/Interventions:      OT Goals(Current goals can be found in the care plan section) Acute Rehab OT Goals Patient Stated Goal: to go home  OT Frequency:     Barriers to D/C:            Co-evaluation  AM-PAC OT "6 Clicks" Daily Activity     Outcome Measure Help from another person eating meals?: None Help from another person taking care of personal grooming?: None Help from another person toileting, which includes using toliet, bedpan, or urinal?: None Help from another person bathing (including washing, rinsing, drying)?: None Help from another person to put on and taking off regular upper body clothing?: None Help from another person to put on and taking off regular lower body clothing?: None 6 Click Score: 24   End of Session    Activity Tolerance: Patient tolerated treatment well Patient left: in chair;with call bell/phone within reach;with nursing/sitter in room  OT Visit Diagnosis: Muscle weakness (generalized) (M62.81)                Time: 1040-1055 OT Time Calculation (min): 15 min Charges:  OT General Charges $OT Visit: 1 Visit OT Evaluation $OT Eval Low Complexity: 1 Low  [name], OTR/L Acute  Rehabilitation Services Pager: [phone] Office: [phone]  [name] 07/09/2019, 11:10 AM

## 2019-07-09 NOTE — Progress Notes (Signed)
[facility] PROGRESS NOTE    Progress Note  [name]  IWL:[id number] DOB: 08/07/1950 DOA: 07/07/2019 PCP: Lujean Amel, MD     Brief Narrative:   [name] is an 69 y.o. female past medical history significant for seizures, essential hypertension hyperlipidemia depression with admissions for prior suicide attempt 2012 fibromyalgia cerebral aneurysm status post repair comes in complaining of abdominal pain without vomiting intermittent with watery diarrhea for several months work-up in the ED was suggestive of a UTI, and admitted for question pancreatitis  Assessment/Plan:   Early pancreatitis No CBD stone on ultrasound, CT question possible inflammation she is tolerating her diet and has completed her IV fluid gentle hydration currently pain-free. CT scan of the abdomen and pelvis showed inflammation of the tail of the pancreas will need follow-up with GI as an outpatient.  Chronic diarrhea: Has remained afebrile with no leukocytosis, GI pathogen is pending urine culture ordered 100,000 colonies of gram-negative rods.   UTI: Urine culture showed 100,000 colonies of gram-negative rods continue IV Rocephin.  Acute kidney injury: In the setting of NSAIDs, diuretic and ACE inhibitor these medications were held she was hydrated and it resolved.  Acute on chronic hyponatremia: Likely due to hypovolemia her diuretics were held she was hydrated to resolve. Will resume HCTZ as an outpatient.  With suicidal ideations: Currently not suicidal [facility] was consulted recommended inpatient psychiatric admission.  Essential hypertension: Continue amlodipine and clonidine along with metoprolol. Will resume diuretic and ACE inhibitor.    DVT prophylaxis: lovenox Family Communication:none Status is: Inpatient  Remains inpatient appropriate because:IV treatments appropriate due to intensity of illness or inability to take PO   Dispo: The patient is from: Home               Anticipated d/c is to: Behavioral health inpatient psychiatric admission              Anticipated d/c date is: 1 day              Patient currently is not medically stable to d/c.        Code Status:     Code Status Orders  (From admission, onward)         Start     Ordered   07/07/19 2222  Full code  Continuous        07/07/19 2225        Code Status History    Date Active Date Inactive Code Status Order ID Comments User Context   01/06/2011 0426 01/08/2011 2100 Full Code [id number]  Jacinto Reap., MD ED   11/25/2010 1039 11/28/2010 1831 Full Code [id number]  Grier Mitts, RN Inpatient   Advance Care Planning Activity        IV Access:    Peripheral IV   Procedures and diagnostic studies:   CT ABDOMEN PELVIS W CONTRAST  Result Date: 07/07/2019 CLINICAL DATA:  Intermittent nausea EXAM: CT ABDOMEN AND PELVIS WITH CONTRAST TECHNIQUE: Multidetector CT imaging of the abdomen and pelvis was performed using the standard protocol following bolus administration of intravenous contrast. CONTRAST:  80 mL OMNIPAQUE IOHEXOL 300 MG/ML  SOLN COMPARISON:  None. FINDINGS: Lower chest: Lung bases demonstrate no acute consolidation or pleural effusion. Small blebs or cysts in the posterior left lung base with scarring. Small hiatal hernia. Hepatobiliary: No focal liver abnormality is seen. No gallstones, gallbladder wall thickening, or biliary dilatation. Pancreas: Edema and soft tissue stranding about the tail of the pancreas. No ductal dilatation Spleen:  Normal in size without focal abnormality. Adrenals/Urinary Tract: Adrenal glands are unremarkable. Kidneys are normal, without renal calculi, focal lesion, or hydronephrosis. Bladder is unremarkable. Stomach/Bowel: Stomach is within normal limits. Appendix appears normal. No evidence of bowel wall thickening, distention, or inflammatory changes. Vascular/Lymphatic: Mild aortic atherosclerosis without aneurysm. No suspicious adenopathy  Reproductive: Status post hysterectomy. No adnexal masses. Other: Negative for free air or free fluid. Musculoskeletal: No acute or significant osseous findings. IMPRESSION: 1. Focal inflammatory process within the left upper quadrant, appears to be centered about the tail of the pancreas and suggests possible acute pancreatitis. Recommend correlation with appropriate enzymes. 2. Otherwise no CT evidence for acute intra-abdominal or pelvic abnormality. Electronically Signed   By: Donavan Foil M.D.   On: 07/07/2019 19:52   US Abdomen Limited RUQ  Result Date: 07/07/2019 CLINICAL DATA:  Pancreatitis. EXAM: ULTRASOUND ABDOMEN LIMITED RIGHT UPPER QUADRANT COMPARISON:  Abdominal CT earlier today. FINDINGS: Gallbladder: Distended. No gallstones or wall thickening visualized. No sonographic Murphy sign noted by sonographer. Common bile duct: Diameter: 3-4 mm, normal. Liver: No focal lesion identified. Heterogeneously increased in parenchymal echogenicity. Portal vein is patent on color Doppler imaging with normal direction of blood flow towards the liver. Other: None. IMPRESSION: 1. No gallstones or biliary dilatation. 2. Hepatic steatosis. Electronically Signed   By: Keith Rake M.D.   On: 07/07/2019 23:43     Medical Consultants:    None.  Anti-Infectives:   rocephin  Subjective:    [name] no complaints.  Objective:    Vitals:   07/08/19 0539 07/08/19 0833 07/08/19 2007 07/09/19 0537  BP: 132/68 (!) 137/53 (!) 141/63 (!) 147/75  Pulse: 66 71 73 64  Resp: 18 20 20    Temp: 98.3 F (36.8 C) 98.2 F (36.8 C) 98.3 F (36.8 C) 98.2 F (36.8 C)  TempSrc: Oral Oral Oral Oral  SpO2: 94% 97% 96% 95%  Weight:      Height:       SpO2: 95 %   Intake/Output Summary (Last 24 hours) at 07/09/2019 0743 Last data filed at 07/09/2019 0500 Gross per 24 hour  Intake 2043.65 ml  Output --  Net 2043.65 ml   Filed Weights   07/07/19 1441  Weight: 79.4 kg    Exam: General exam: In  no acute distress. Respiratory system: Good air movement and clear to auscultation. Cardiovascular system: S1 & S2 heard, RRR.   Gastrointestinal system: Abdomen is nondistended, soft and nontender.  Extremities: No pedal edema. Skin: No rashes, lesions or ulcers Psychiatry: Judgement and insight appear normal.    Data Reviewed:    Labs: Basic Metabolic Panel: Recent Labs  Lab 07/07/19 1523 07/08/19 0313  NA 130* 135  K 4.0 3.7  CL 95* 100  CO2 24 25  GLUCOSE 108* 94  BUN 23 15  CREATININE 1.37* 0.88  CALCIUM 9.7 8.7*   GFR Estimated Creatinine Clearance: 62.3 mL/min (by C-G formula based on SCr of 0.88 mg/dL). Liver Function Tests: Recent Labs  Lab 07/07/19 1523 07/08/19 0313  AST 28 25  ALT 23 21  ALKPHOS 44 36*  BILITOT 0.6 0.4  PROT 7.2 5.9*  ALBUMIN 4.2 3.2*   Recent Labs  Lab 07/07/19 1523 07/08/19 0313  LIPASE 26 25   No results for input(s): AMMONIA in the last 168 hours. Coagulation profile No results for input(s): INR, PROTIME in the last 168 hours. COVID-19 Labs  No results for input(s): DDIMER, FERRITIN, LDH, CRP in the last 72 hours.  Lab  Results  Component Value Date   [place] NEGATIVE 07/07/2019   [place] Not Detected 12/11/2018    CBC: Recent Labs  Lab 07/07/19 1523  WBC 9.9  HGB 13.7  HCT 40.4  MCV 87.6  PLT 328   Cardiac Enzymes: No results for input(s): CKTOTAL, CKMB, CKMBINDEX, TROPONINI in the last 168 hours. BNP (last 3 results) No results for input(s): PROBNP in the last 8760 hours. CBG: No results for input(s): GLUCAP in the last 168 hours. D-Dimer: No results for input(s): DDIMER in the last 72 hours. Hgb A1c: No results for input(s): HGBA1C in the last 72 hours. Lipid Profile: No results for input(s): CHOL, HDL, LDLCALC, TRIG, CHOLHDL, LDLDIRECT in the last 72 hours. Thyroid function studies: Recent Labs    07/08/19 0313  TSH 1.418   Anemia work up: No results for input(s): VITAMINB12, FOLATE,  FERRITIN, TIBC, IRON, RETICCTPCT in the last 72 hours. Sepsis Labs: Recent Labs  Lab 07/07/19 1523  WBC 9.9   Microbiology Recent Results (from the past 240 hour(s))  Urine culture     Status: Abnormal (Preliminary result)   Collection Time: 07/07/19  8:57 PM   Specimen: Urine, Random  Result Value Ref Range Status   Specimen Description URINE, RANDOM  Final   Special Requests NONE  Final   Culture (A)  Final    >=100,000 COLONIES/mL GRAM NEGATIVE RODS IDENTIFICATION AND SUSCEPTIBILITIES TO FOLLOW Performed at [facility], [address] [place], [place] [address]    Report Status PENDING  Incomplete  SARS Coronavirus 2 by RT PCR (hospital order, performed in [facility] hospital lab) Nasopharyngeal Nasopharyngeal Swab     Status: None   Collection Time: 07/07/19  8:57 PM   Specimen: Nasopharyngeal Swab  Result Value Ref Range Status   SARS Coronavirus 2 NEGATIVE NEGATIVE Final    Comment: (NOTE) SARS-CoV-2 target nucleic acids are NOT DETECTED.  The SARS-CoV-2 RNA is generally detectable in upper and lower respiratory specimens during the acute phase of infection. The lowest concentration of SARS-CoV-2 viral copies this assay can detect is 250 copies / mL. A negative result does not preclude SARS-CoV-2 infection and should not be used as the sole basis for treatment or other patient management decisions.  A negative result may occur with improper specimen collection / handling, submission of specimen other than nasopharyngeal swab, presence of viral mutation(s) within the areas targeted by this assay, and inadequate number of viral copies (<250 copies / mL). A negative result must be combined with clinical observations, patient history, and epidemiological information.  Fact Sheet for Patients:   StrictlyIdeas.no  Fact Sheet for Healthcare Providers: BankingDealers.co.za  This test is not yet approved or  cleared by  the [place] FDA and has been authorized for detection and/or diagnosis of SARS-CoV-2 by FDA under an Emergency Use Authorization (EUA).  This EUA will remain in effect (meaning this test can be used) for the duration of the COVID-19 declaration under Section 564(b)(1) of the Act, 21 U.S.C. section 360bbb-3(b)(1), unless the authorization is terminated or revoked sooner.  Performed at [facility], [place] [address], [place], [place] [address]      Medications:   . amLODipine  5 mg Oral Daily  . cloNIDine  0.1 mg Oral BID  . docusate sodium  100 mg Oral BID  . enoxaparin (LOVENOX) injection  40 mg Subcutaneous Q24H  . fenofibrate  54 mg Oral Daily  . levETIRAcetam  500 mg Oral BID  .  metoprolol tartrate  50 mg Oral BID  . pantoprazole  20 mg Oral Daily  . sodium chloride flush  3 mL Intravenous Once   Continuous Infusions: . cefTRIAXone (ROCEPHIN)  IV Stopped (07/08/19 2032)      LOS: 2 days   Charlynne Cousins  [facility]  07/09/2019, 7:43 AM

## 2019-07-10 LAB — GASTROINTESTINAL PANEL BY PCR, STOOL (REPLACES STOOL CULTURE)

## 2019-07-10 LAB — MAGNESIUM: Magnesium: 1.9 mg/dL (ref 1.7–2.4)

## 2019-07-10 MED ORDER — CEPHALEXIN 500 MG PO CAPS
500.0000 mg | ORAL_CAPSULE | Freq: Three times a day (TID) | ORAL | Status: AC
  Administered 2019-07-10 – 2019-07-11 (×6): 500 mg via ORAL
  Filled 2019-07-10 (×6): qty 1

## 2019-07-10 MED ORDER — CEPHALEXIN 500 MG PO CAPS: 500.0000 mg | ORAL_CAPSULE | Freq: Three times a day (TID) | ORAL | Status: DC

## 2019-07-10 NOTE — Discharge Summary (Signed)
Physician Discharge Summary  [name] LGX:[id number] DOB: [birth year] DOA: 07/07/2019  PCP: Lujean Amel, MD  Admit date: 07/07/2019 Discharge date: 07/10/2019  Admitted From: Home Disposition: Behavioral health   Recommendations for Outpatient Follow-up:  1. Follow up with GI in 1-2 weeks 2. Please obtain BMP/CBC in one week  Home Health:No Equipment/Devices:None  Discharge Condition:Stable CODE STATUS:Full Diet recommendation: Heart Healthy  Brief/Interim Summary: 69 y.o. female past medical history significant for seizures, essential hypertension hyperlipidemia depression with admissions for prior suicide attempt 2012 fibromyalgia cerebral aneurysm status post repair comes in complaining of abdominal pain without vomiting intermittent with watery diarrhea for several months work-up in the ED was suggestive of a UTI, and admitted for question pancreatitis   Discharge Diagnoses:  Principal Problem:   Pancreatitis Active Problems:   Chronic diarrhea   UTI (urinary tract infection)   AKI (acute kidney injury) ([place])   Depression with suicidal ideation  Early pancreatitis: Abdominal ultrasound showed no CBD stone, CT showed possible inflammation in the distal tail of the pancreas.  She was treated conservatively with IV fluids and pain medication. She denies any alcohol abuse no changes or new medication she will follow up with GI as an outpatient. She is on HCTZ which was discontinued and she will follow up with PCP as an outpatient for blood pressure and titrate antihypertensive medications as needed.  Chronic diarrhea: Has remained afebrile with no leukocytosis.  No signs of calcification on CT to question chronic pancreatitis. The diarrhea resolved.  Acute UTI: Urine cultures show more than 100,000 colonies of E. coli she was started empirically on Rocephin on admission now transition to oral Keflex which she will continue for 2 additional days.  Acute kidney  injury: In the setting of NSAIDs, diuretic and ACE inhibitor these were held she was hydrated her creatinine returned to baseline.  Acute on chronic hyponatremia: Likely due to hypovolemia is resolved with IV fluid hydration.  HCTZ was discontinued as this could cause pancreatitis and her hyponatremia was attributed to this. Follow-up with PCP and start medications as needed.  Suicidal ideation: Psychiatry was consulted who recommended inpatient psychiatric admission she is medically stable for transfer.  Essential hypertension: Hydrochlorothiazide was discontinued she will continue amlodipine clonidine, metoprolol and will resume her ACE inhibitor.  Follow-up with PCP as an outpatient and titrate as antihypertensive medication as needed.  Discharge Instructions  Discharge Instructions    Diet - low sodium heart healthy   Complete by: As directed    Increase activity slowly   Complete by: As directed      Allergies as of 07/10/2019      Reactions   Barium-containing Compounds Nausea And Vomiting   Eggs Or Egg-derived Products Nausea And Vomiting   Red Dye Nausea And Vomiting      Medication List    TAKE these medications   acetaminophen 500 MG tablet Commonly known as: TYLENOL Take 500-1,000 mg by mouth 2 (two) times daily.   amLODipine 5 MG tablet Commonly known as: NORVASC Take 5 mg by mouth daily.   baclofen 10 MG tablet Commonly known as: LIORESAL Take 10 mg by mouth at bedtime.   CALCIUM-D PO Take 1 tablet by mouth daily.   cephALEXin 500 MG capsule Commonly known as: KEFLEX Take 1 capsule (500 mg total) by mouth every 8 (eight) hours.   cloNIDine 0.1 MG tablet Commonly known as: CATAPRES TAKE 1 TABLET(0.1 MG) BY MOUTH TWICE DAILY What changed: See the new instructions.   fenofibrate 54  MG tablet Take 54 mg by mouth daily.   hydrochlorothiazide 25 MG tablet Commonly known as: HYDRODIURIL Take 25 mg by mouth daily.   hydrocortisone cream 1 % Apply 1  application topically 2 (two) times daily as needed for itching (eczema).   levETIRAcetam 500 MG tablet Commonly known as: KEPPRA Take 1 tablet (500 mg total) by mouth 2 (two) times daily.   losartan 100 MG tablet Commonly known as: COZAAR Take 100 mg by mouth daily.   metoprolol tartrate 50 MG tablet Commonly known as: LOPRESSOR Take 50 mg by mouth 2 (two) times daily.   naproxen sodium 220 MG tablet Commonly known as: ALEVE Take 440 mg by mouth 2 (two) times daily.   omeprazole 20 MG capsule Commonly known as: PRILOSEC Take 20 mg by mouth at bedtime.   traZODone 100 MG tablet Commonly known as: DESYREL Take 100 mg by mouth at bedtime.       Allergies  Allergen Reactions  . Barium-Containing Compounds Nausea And Vomiting  . Eggs Or Egg-Derived Products Nausea And Vomiting  . Red Dye Nausea And Vomiting    Consultations: None  Procedures/Studies: CT ABDOMEN PELVIS W CONTRAST  Result Date: 07/07/2019 CLINICAL DATA:  Intermittent nausea EXAM: CT ABDOMEN AND PELVIS WITH CONTRAST TECHNIQUE: Multidetector CT imaging of the abdomen and pelvis was performed using the standard protocol following bolus administration of intravenous contrast. CONTRAST:  80 mL OMNIPAQUE IOHEXOL 300 MG/ML  SOLN COMPARISON:  None. FINDINGS: Lower chest: Lung bases demonstrate no acute consolidation or pleural effusion. Small blebs or cysts in the posterior left lung base with scarring. Small hiatal hernia. Hepatobiliary: No focal liver abnormality is seen. No gallstones, gallbladder wall thickening, or biliary dilatation. Pancreas: Edema and soft tissue stranding about the tail of the pancreas. No ductal dilatation Spleen: Normal in size without focal abnormality. Adrenals/Urinary Tract: Adrenal glands are unremarkable. Kidneys are normal, without renal calculi, focal lesion, or hydronephrosis. Bladder is unremarkable. Stomach/Bowel: Stomach is within normal limits. Appendix appears normal. No evidence  of bowel wall thickening, distention, or inflammatory changes. Vascular/Lymphatic: Mild aortic atherosclerosis without aneurysm. No suspicious adenopathy Reproductive: Status post hysterectomy. No adnexal masses. Other: Negative for free air or free fluid. Musculoskeletal: No acute or significant osseous findings. IMPRESSION: 1. Focal inflammatory process within the left upper quadrant, appears to be centered about the tail of the pancreas and suggests possible acute pancreatitis. Recommend correlation with appropriate enzymes. 2. Otherwise no CT evidence for acute intra-abdominal or pelvic abnormality. Electronically Signed   By: Donavan Foil M.D.   On: 07/07/2019 19:52   MM 3D SCREEN BREAST BILATERAL  Result Date: 06/16/2019 CLINICAL DATA:  Screening. EXAM: DIGITAL SCREENING BILATERAL MAMMOGRAM WITH TOMO AND CAD COMPARISON:  None. ACR Breast Density Category b: There are scattered areas of fibroglandular density. FINDINGS: There are no findings suspicious for malignancy. Images were processed with CAD. IMPRESSION: No mammographic evidence of malignancy. A result letter of this screening mammogram will be mailed directly to the patient. RECOMMENDATION: Screening mammogram in one year. (Code:[id number]-B-01Y) BI-RADS CATEGORY  1: Negative. Electronically Signed   By: Marin Olp M.D.   On: 06/16/2019 14:32   US Abdomen Limited RUQ  Result Date: 07/07/2019 CLINICAL DATA:  Pancreatitis. EXAM: ULTRASOUND ABDOMEN LIMITED RIGHT UPPER QUADRANT COMPARISON:  Abdominal CT earlier today. FINDINGS: Gallbladder: Distended. No gallstones or wall thickening visualized. No sonographic Murphy sign noted by sonographer. Common bile duct: Diameter: 3-4 mm, normal. Liver: No focal lesion identified. Heterogeneously increased in parenchymal echogenicity. Portal vein  is patent on color Doppler imaging with normal direction of blood flow towards the liver. Other: None. IMPRESSION: 1. No gallstones or biliary dilatation. 2. Hepatic  steatosis. Electronically Signed   By: Keith Rake M.D.   On: 07/07/2019 23:43     Subjective: No complains  Discharge Exam: Vitals:   07/10/19 0546 07/10/19 0839  BP: 135/73 (!) 168/79  Pulse: 60 83  Resp: 16 18  Temp: 97.6 F (36.4 C) 97.6 F (36.4 C)  SpO2: 98% 99%   Vitals:   07/09/19 1358 07/09/19 2100 07/10/19 0546 07/10/19 0839  BP: 138/83 138/68 135/73 (!) 168/79  Pulse: 77 77 60 83  Resp: 18 18 16 18   Temp: 97.7 F (36.5 C) 98.1 F (36.7 C) 97.6 F (36.4 C) 97.6 F (36.4 C)  TempSrc: Oral Oral Oral Oral  SpO2: 98% 98% 98% 99%  Weight:      Height:        General: Pt is alert, awake, not in acute distress Cardiovascular: RRR, S1/S2 +, no rubs, no gallops Respiratory: CTA bilaterally, no wheezing, no rhonchi Abdominal: Soft, NT, ND, bowel sounds + Extremities: no edema, no cyanosis    The results of significant diagnostics from this hospitalization (including imaging, microbiology, ancillary and laboratory) are listed below for reference.     Microbiology: Recent Results (from the past 240 hour(s))  Urine culture     Status: Abnormal   Collection Time: 07/07/19  8:57 PM   Specimen: Urine, Random  Result Value Ref Range Status   Specimen Description URINE, RANDOM  Final   Special Requests   Final    NONE Performed at [facility], [address] [place], [place] [address]    Culture >=100,000 COLONIES/mL ESCHERICHIA COLI (A)  Final   Report Status 07/09/2019 FINAL  Final   Organism ID, Bacteria ESCHERICHIA COLI (A)  Final      Susceptibility   Escherichia coli - MIC*    AMPICILLIN >=32 RESISTANT Resistant     CEFAZOLIN <=4 SENSITIVE Sensitive     CEFTRIAXONE <=0.25 SENSITIVE Sensitive     CIPROFLOXACIN >=4 RESISTANT Resistant     GENTAMICIN >=16 RESISTANT Resistant     IMIPENEM <=0.25 SENSITIVE Sensitive     NITROFURANTOIN <=16 SENSITIVE Sensitive     TRIMETH/SULFA <=20 SENSITIVE Sensitive     AMPICILLIN/SULBACTAM 4 SENSITIVE  Sensitive     PIP/TAZO <=4 SENSITIVE Sensitive     * >=100,000 COLONIES/mL ESCHERICHIA COLI  SARS Coronavirus 2 by RT PCR (hospital order, performed in [place] hospital lab) Nasopharyngeal Nasopharyngeal Swab     Status: None   Collection Time: 07/07/19  8:57 PM   Specimen: Nasopharyngeal Swab  Result Value Ref Range Status   SARS Coronavirus 2 NEGATIVE NEGATIVE Final    Comment: (NOTE) SARS-CoV-2 target nucleic acids are NOT DETECTED.  The SARS-CoV-2 RNA is generally detectable in upper and lower respiratory specimens during the acute phase of infection. The lowest concentration of SARS-CoV-2 viral copies this assay can detect is 250 copies / mL. A negative result does not preclude SARS-CoV-2 infection and should not be used as the sole basis for treatment or other patient management decisions.  A negative result may occur with improper specimen collection / handling, submission of specimen other than nasopharyngeal swab, presence of viral mutation(s) within the areas targeted by this assay, and inadequate number of viral copies (<250 copies / mL). A negative result must be combined with clinical observations, patient history, and epidemiological information.  Fact Sheet for Patients:   StrictlyIdeas.no  Fact Sheet for Healthcare Providers: BankingDealers.co.za  This test is not yet approved or  cleared by the [place] FDA and has been authorized for detection and/or diagnosis of SARS-CoV-2 by FDA under an Emergency Use Authorization (EUA).  This EUA will remain in effect (meaning this test can be used) for the duration of the COVID-19 declaration under Section 564(b)(1) of the Act, 21 U.S.C. section 360bbb-3(b)(1), unless the authorization is terminated or revoked sooner.  Performed at [facility], [place] [address], [place], [place] [address]      Labs: BNP (last 3 results) No results for input(s): BNP in the  last 8760 hours. Basic Metabolic Panel: Recent Labs  Lab 07/07/19 1523 07/08/19 0313 07/09/19 0936 07/10/19 0442  NA 130* 135  --   --   K 4.0 3.7  --   --   CL 95* 100  --   --   CO2 24 25  --   --   GLUCOSE 108* 94  --   --   BUN 23 15  --   --   CREATININE 1.37* 0.88  --   --   CALCIUM 9.7 8.7*  --   --   MG  --   --  1.9 1.9   Liver Function Tests: Recent Labs  Lab 07/07/19 1523 07/08/19 0313  AST 28 25  ALT 23 21  ALKPHOS 44 36*  BILITOT 0.6 0.4  PROT 7.2 5.9*  ALBUMIN 4.2 3.2*   Recent Labs  Lab 07/07/19 1523 07/08/19 0313  LIPASE 26 25   No results for input(s): AMMONIA in the last 168 hours. CBC: Recent Labs  Lab 07/07/19 1523  WBC 9.9  HGB 13.7  HCT 40.4  MCV 87.6  PLT 328   Cardiac Enzymes: No results for input(s): CKTOTAL, CKMB, CKMBINDEX, TROPONINI in the last 168 hours. BNP: Invalid input(s): POCBNP CBG: No results for input(s): GLUCAP in the last 168 hours. D-Dimer No results for input(s): DDIMER in the last 72 hours. Hgb A1c No results for input(s): HGBA1C in the last 72 hours. Lipid Profile No results for input(s): CHOL, HDL, LDLCALC, TRIG, CHOLHDL, LDLDIRECT in the last 72 hours. Thyroid function studies Recent Labs    07/08/19 0313  TSH 1.418   Anemia work up No results for input(s): VITAMINB12, FOLATE, FERRITIN, TIBC, IRON, RETICCTPCT in the last 72 hours. Urinalysis    Component Value Date/Time   COLORURINE YELLOW 07/07/2019 1944   APPEARANCEUR HAZY (A) 07/07/2019 1944   LABSPEC 1.016 07/07/2019 1944   PHURINE 6.0 07/07/2019 1944   GLUCOSEU NEGATIVE 07/07/2019 1944   HGBUR LARGE (A) 07/07/2019 1944   BILIRUBINUR NEGATIVE 07/07/2019 1944   KETONESUR NEGATIVE 07/07/2019 1944   PROTEINUR NEGATIVE 07/07/2019 1944   UROBILINOGEN 0.2 12/23/2010 1602   NITRITE NEGATIVE 07/07/2019 1944   LEUKOCYTESUR LARGE (A) 07/07/2019 1944   Sepsis Labs Invalid input(s): PROCALCITONIN,  WBC,  LACTICIDVEN Microbiology Recent Results  (from the past 240 hour(s))  Urine culture     Status: Abnormal   Collection Time: 07/07/19  8:57 PM   Specimen: Urine, Random  Result Value Ref Range Status   Specimen Description URINE, RANDOM  Final   Special Requests   Final    NONE Performed at [facility], [place] [address] [place], [place] [address]    Culture >=100,000 COLONIES/mL ESCHERICHIA COLI (A)  Final   Report Status 07/09/2019 FINAL  Final   Organism ID,  Bacteria ESCHERICHIA COLI (A)  Final      Susceptibility   Escherichia coli - MIC*    AMPICILLIN >=32 RESISTANT Resistant     CEFAZOLIN <=4 SENSITIVE Sensitive     CEFTRIAXONE <=0.25 SENSITIVE Sensitive     CIPROFLOXACIN >=4 RESISTANT Resistant     GENTAMICIN >=16 RESISTANT Resistant     IMIPENEM <=0.25 SENSITIVE Sensitive     NITROFURANTOIN <=16 SENSITIVE Sensitive     TRIMETH/SULFA <=20 SENSITIVE Sensitive     AMPICILLIN/SULBACTAM 4 SENSITIVE Sensitive     PIP/TAZO <=4 SENSITIVE Sensitive     * >=100,000 COLONIES/mL ESCHERICHIA COLI  SARS Coronavirus 2 by RT PCR (hospital order, performed in [place] hospital lab) Nasopharyngeal Nasopharyngeal Swab     Status: None   Collection Time: 07/07/19  8:57 PM   Specimen: Nasopharyngeal Swab  Result Value Ref Range Status   SARS Coronavirus 2 NEGATIVE NEGATIVE Final    Comment: (NOTE) SARS-CoV-2 target nucleic acids are NOT DETECTED.  The SARS-CoV-2 RNA is generally detectable in upper and lower respiratory specimens during the acute phase of infection. The lowest concentration of SARS-CoV-2 viral copies this assay can detect is 250 copies / mL. A negative result does not preclude SARS-CoV-2 infection and should not be used as the sole basis for treatment or other patient management decisions.  A negative result may occur with improper specimen collection / handling, submission of specimen other than nasopharyngeal swab, presence of viral mutation(s) within the areas targeted by this assay, and inadequate  number of viral copies (<250 copies / mL). A negative result must be combined with clinical observations, patient history, and epidemiological information.  Fact Sheet for Patients:   StrictlyIdeas.no  Fact Sheet for Healthcare Providers: BankingDealers.co.za  This test is not yet approved or  cleared by the [place] FDA and has been authorized for detection and/or diagnosis of SARS-CoV-2 by FDA under an Emergency Use Authorization (EUA).  This EUA will remain in effect (meaning this test can be used) for the duration of the COVID-19 declaration under Section 564(b)(1) of the Act, 21 U.S.C. section 360bbb-3(b)(1), unless the authorization is terminated or revoked sooner.  Performed at [facility], [place] [address] [place], [place] [address]      Time coordinating discharge: Over 40 minutes  SIGNED:   Charlynne Cousins, MD  [facility] 07/10/2019, 8:43 AM Pager   If 7PM-7AM, please contact night-coverage [email or web address] Password [email or web address]

## 2019-07-10 NOTE — TOC Initial Note (Addendum)
Transition of Care [facility]) - Initial/Assessment Note    Patient Details  Name: [name] MRN: [id number] Date of Birth: [birth year]  Transition of Care [facility]) CM/SW Contact:    Sharin Mons, RN Phone Number: 07/10/2019, 11:40 AM  Clinical Narrative:                 Admitted with pancreatitis , UTI, suicidal ideation. Hx of PMH includes seizures, HTN, HLD, fibromyalgia, depression. From home with roommate. States homeless once d/c .Marland Kitchen. will need housing.  Psych evaluated 6/15 : Recommend psychiatric Inpatient admission when medically cleared.  Pt states will voluntarily admit to a inpatient psych faciltiy.   Referral made with [facility], awaiting response.  TOC will continue monnitoring for needs ....  07/10/2019 @ 1445 NCM unable to make referral with [facility]... called multi times , no answer. Referrals made with [facility] [phone]) and [facility] [phone]) via fax , awaiting response.  Expected Discharge Plan: Psychiatric Hospital Barriers to Discharge: Other (comment) (Awaitng inpatient psych bed)   Patient Goals and CMS Choice        Expected Discharge Plan and Services Expected Discharge Plan: Psychiatric Hospital         Expected Discharge Date: 07/10/19                                    Prior Living Arrangements/Services                       Activities of Daily Living Home Assistive Devices/Equipment: None ADL Screening (condition at time of admission) Patient's cognitive ability adequate to safely complete daily activities?: Yes Is the patient deaf or have difficulty hearing?: Yes Does the patient have difficulty seeing, even when wearing glasses/contacts?: No Does the patient have difficulty concentrating, remembering, or making decisions?: No Patient able to express need for assistance with ADLs?: Yes Does the patient have difficulty dressing or bathing?: No Independently performs ADLs?: Yes (appropriate for  developmental age) Does the patient have difficulty walking or climbing stairs?: No Weakness of Legs: None Weakness of Arms/Hands: None  Permission Sought/Granted                  Emotional Assessment              Admission diagnosis:  Pancreatitis [K85.90] AKI (acute kidney injury) ([place]) [N17.9] Urinary tract infection with hematuria, site unspecified [N39.0, R31.9] Depression, unspecified depression type [F32.9] Acute pancreatitis without infection or necrosis, unspecified pancreatitis type [K85.90] Patient Active Problem List   Diagnosis Date Noted  . Pancreatitis 07/07/2019  . Chronic diarrhea 07/07/2019  . UTI (urinary tract infection) 07/07/2019  . AKI (acute kidney injury) ([place]) 07/07/2019  . Depression with suicidal ideation 07/07/2019  . Hypertension 01/07/2011  . Suicidal thoughts 01/06/2011  . Gait instability 11/28/2010  . Hyponatremia 11/28/2010  . Ulnar neuropathy 06/03/2010  . Depression 06/03/2010  . Insomnia 06/03/2010  . Seizure ([place]) 06/03/2010  . Recurrent UTI 06/03/2010  . Arthritis 06/03/2010   PCP:  Lujean Amel, MD Pharmacy:   [id number] - [place], [place] [address] Phone: [phone] Fax: [phone]     Social Determinants of Health (SDOH) Interventions    Readmission Risk Interventions No flowsheet data found.

## 2019-07-10 NOTE — Plan of Care (Signed)

## 2019-07-11 LAB — MAGNESIUM: Magnesium: 1.8 mg/dL (ref 1.7–2.4)

## 2019-07-11 MED ORDER — DIPHENHYDRAMINE HCL 25 MG PO CAPS
25.0000 mg | ORAL_CAPSULE | Freq: Four times a day (QID) | ORAL | Status: DC | PRN
  Administered 2019-07-11 – 2019-07-13 (×3): 25 mg via ORAL
  Filled 2019-07-11 (×3): qty 1

## 2019-07-11 MED ORDER — TRAZODONE HCL 50 MG PO TABS
50.0000 mg | ORAL_TABLET | Freq: Once | ORAL | Status: AC
  Administered 2019-07-11: 50 mg via ORAL
  Filled 2019-07-11: qty 1

## 2019-07-11 NOTE — Progress Notes (Signed)
[facility] PROGRESS NOTE    Progress Note  [name]  IOE:[id number] DOB: 04/10/1950 DOA: 07/07/2019 PCP: Lujean Amel, MD     Brief Narrative:   [name] is an 69 y.o. female past medical history significant for seizures, essential hypertension hyperlipidemia depression with admissions for prior suicide attempt 2012 fibromyalgia cerebral aneurysm status post repair comes in complaining of abdominal pain without vomiting intermittent with watery diarrhea for several months work-up in the ED was suggestive of a UTI, and admitted for question pancreatitis  Assessment/Plan:   Early pancreatitis: No nausea vomiting tolerating her diet.  Chronic diarrhea: Resolved.  UTI: Urine culture grew E. coli she will continue her antibiotic treatment as an outpatient.  Acute kidney injury: In the setting of NSAIDs, diuretic and ACE inhibitor these medications were held she was hydrated and it resolved.  Acute on chronic hyponatremia: Likely due to hypovolemia her diuretics were held she was hydrated to resolve. Will resume HCTZ as an outpatient.  With suicidal ideations: Currently not suicidal [facility] was consulted recommended inpatient psychiatric admission.  Essential hypertension: Continue amlodipine and clonidine along with metoprolol. Will resume diuretic and ACE inhibitor.    DVT prophylaxis: lovenox Family Communication:none Status is: Inpatient  Remains inpatient appropriate because:IV treatments appropriate due to intensity of illness or inability to take PO   Dispo: The patient is from: Home              Anticipated d/c is to: Behavioral health inpatient psychiatric admission              Anticipated d/c date is: 1 day              Patient currently is medically stable to d/c.  Patient is awaiting placement to behavioral health.        Code Status:     Code Status Orders  (From admission, onward)         Start     Ordered   07/07/19 2222   Full code  Continuous        07/07/19 2225        Code Status History    Date Active Date Inactive Code Status Order ID Comments User Context   01/06/2011 0426 01/08/2011 2100 Full Code [id number]  Jacinto Reap., MD ED   11/25/2010 1039 11/28/2010 1831 Full Code [id number]  Grier Mitts, RN Inpatient   Advance Care Planning Activity        IV Access:    Peripheral IV   Procedures and diagnostic studies:   No results found.   Medical Consultants:    None.  Anti-Infectives:   rocephin  Subjective:    [name] no complaints.  Objective:    Vitals:   07/09/19 2100 07/10/19 0546 07/10/19 0839 07/11/19 0510  BP: 138/68 135/73 (!) 168/79 (!) 147/66  Pulse: 77 60 83 64  Resp: 18 16 18 16   Temp: 98.1 F (36.7 C) 97.6 F (36.4 C) 97.6 F (36.4 C) (!) 97.4 F (36.3 C)  TempSrc: Oral Oral Oral Oral  SpO2: 98% 98% 99% 97%  Weight:      Height:       SpO2: 97 %   Intake/Output Summary (Last 24 hours) at 07/11/2019 0818 Last data filed at 07/11/2019 0745 [place] per 24 hour  Intake 960 ml  Output --  Net 960 ml   Filed Weights   07/07/19 1441  Weight: 79.4 kg    Exam: General exam: In no acute distress.  Respiratory system: Good air movement and clear to auscultation. Cardiovascular system: S1 & S2 heard, RRR.   Gastrointestinal system: Abdomen is nondistended, soft and nontender.  Extremities: No pedal edema. Skin: No rashes, lesions or ulcers Psychiatry: Judgement and insight appear normal.    Data Reviewed:    Labs: Basic Metabolic Panel: Recent Labs  Lab 07/07/19 1523 07/08/19 0313 07/09/19 0936 07/10/19 0442 07/11/19 0412  NA 130* 135  --   --   --   K 4.0 3.7  --   --   --   CL 95* 100  --   --   --   CO2 24 25  --   --   --   GLUCOSE 108* 94  --   --   --   BUN 23 15  --   --   --   CREATININE 1.37* 0.88  --   --   --   CALCIUM 9.7 8.7*  --   --   --   MG  --   --  1.9 1.9 1.8   GFR Estimated Creatinine Clearance:  62.3 mL/min (by C-G formula based on SCr of 0.88 mg/dL). Liver Function Tests: Recent Labs  Lab 07/07/19 1523 07/08/19 0313  AST 28 25  ALT 23 21  ALKPHOS 44 36*  BILITOT 0.6 0.4  PROT 7.2 5.9*  ALBUMIN 4.2 3.2*   Recent Labs  Lab 07/07/19 1523 07/08/19 0313  LIPASE 26 25   No results for input(s): AMMONIA in the last 168 hours. Coagulation profile No results for input(s): INR, PROTIME in the last 168 hours. COVID-19 Labs  No results for input(s): DDIMER, FERRITIN, LDH, CRP in the last 72 hours.  Lab Results  Component Value Date   [place] NEGATIVE 07/07/2019   [place] Not Detected 12/11/2018    CBC: Recent Labs  Lab 07/07/19 1523  WBC 9.9  HGB 13.7  HCT 40.4  MCV 87.6  PLT 328   Cardiac Enzymes: No results for input(s): CKTOTAL, CKMB, CKMBINDEX, TROPONINI in the last 168 hours. BNP (last 3 results) No results for input(s): PROBNP in the last 8760 hours. CBG: No results for input(s): GLUCAP in the last 168 hours. D-Dimer: No results for input(s): DDIMER in the last 72 hours. Hgb A1c: No results for input(s): HGBA1C in the last 72 hours. Lipid Profile: No results for input(s): CHOL, HDL, LDLCALC, TRIG, CHOLHDL, LDLDIRECT in the last 72 hours. Thyroid function studies: No results for input(s): TSH, T4TOTAL, T3FREE, THYROIDAB in the last 72 hours.  Invalid input(s): FREET3 Anemia work up: No results for input(s): VITAMINB12, FOLATE, FERRITIN, TIBC, IRON, RETICCTPCT in the last 72 hours. Sepsis Labs: Recent Labs  Lab 07/07/19 1523  WBC 9.9   Microbiology Recent Results (from the past 240 hour(s))  Urine culture     Status: Abnormal   Collection Time: 07/07/19  8:57 PM   Specimen: Urine, Random  Result Value Ref Range Status   Specimen Description URINE, RANDOM  Final   Special Requests   Final    NONE Performed at [facility], [address], [place],  [address]    Culture >=100,000 COLONIES/mL ESCHERICHIA COLI (A)  Final    Report Status 07/09/2019 FINAL  Final   Organism ID, Bacteria ESCHERICHIA COLI (A)  Final      Susceptibility   Escherichia coli - MIC*    AMPICILLIN >=32 RESISTANT Resistant     CEFAZOLIN <=4 SENSITIVE Sensitive     CEFTRIAXONE <=0.25 SENSITIVE  Sensitive     CIPROFLOXACIN >=4 RESISTANT Resistant     GENTAMICIN >=16 RESISTANT Resistant     IMIPENEM <=0.25 SENSITIVE Sensitive     NITROFURANTOIN <=16 SENSITIVE Sensitive     TRIMETH/SULFA <=20 SENSITIVE Sensitive     AMPICILLIN/SULBACTAM 4 SENSITIVE Sensitive     PIP/TAZO <=4 SENSITIVE Sensitive     * >=100,000 COLONIES/mL ESCHERICHIA COLI  SARS Coronavirus 2 by RT PCR (hospital order, performed in [place] hospital lab) Nasopharyngeal Nasopharyngeal Swab     Status: None   Collection Time: 07/07/19  8:57 PM   Specimen: Nasopharyngeal Swab  Result Value Ref Range Status   SARS Coronavirus 2 NEGATIVE NEGATIVE Final    Comment: (NOTE) SARS-CoV-2 target nucleic acids are NOT DETECTED.  The SARS-CoV-2 RNA is generally detectable in upper and lower respiratory specimens during the acute phase of infection. The lowest concentration of SARS-CoV-2 viral copies this assay can detect is 250 copies / mL. A negative result does not preclude SARS-CoV-2 infection and should not be used as the sole basis for treatment or other patient management decisions.  A negative result may occur with improper specimen collection / handling, submission of specimen other than nasopharyngeal swab, presence of viral mutation(s) within the areas targeted by this assay, and inadequate number of viral copies (<250 copies / mL). A negative result must be combined with clinical observations, patient history, and epidemiological information.  Fact Sheet for Patients:   StrictlyIdeas.no  Fact Sheet for Healthcare Providers: BankingDealers.co.za  This test is not yet approved or  cleared by the [place] FDA  and has been authorized for detection and/or diagnosis of SARS-CoV-2 by FDA under an Emergency Use Authorization (EUA).  This EUA will remain in effect (meaning this test can be used) for the duration of the COVID-19 declaration under Section 564(b)(1) of the Act, 21 U.S.C. section 360bbb-3(b)(1), unless the authorization is terminated or revoked sooner.  Performed at [facility], [place] [address] [place], [place] [address]   Gastrointestinal Panel by PCR , Stool     Status: None   Collection Time: 07/09/19 11:16 AM   Specimen: Stool  Result Value Ref Range Status   Campylobacter species NOT DETECTED NOT DETECTED Final   Plesimonas shigelloides NOT DETECTED NOT DETECTED Final   Salmonella species NOT DETECTED NOT DETECTED Final   Yersinia enterocolitica NOT DETECTED NOT DETECTED Final   Vibrio species NOT DETECTED NOT DETECTED Final   Vibrio cholerae NOT DETECTED NOT DETECTED Final   Enteroaggregative E coli (EAEC) NOT DETECTED NOT DETECTED Final   Enteropathogenic E coli (EPEC) NOT DETECTED NOT DETECTED Final   Enterotoxigenic E coli (ETEC) NOT DETECTED NOT DETECTED Final   Shiga like toxin producing E coli (STEC) NOT DETECTED NOT DETECTED Final   Shigella/Enteroinvasive E coli (EIEC) NOT DETECTED NOT DETECTED Final   Cryptosporidium NOT DETECTED NOT DETECTED Final   Cyclospora cayetanensis NOT DETECTED NOT DETECTED Final   Entamoeba histolytica NOT DETECTED NOT DETECTED Final   Giardia lamblia NOT DETECTED NOT DETECTED Final   Adenovirus F40/41 NOT DETECTED NOT DETECTED Final   Astrovirus NOT DETECTED NOT DETECTED Final   Norovirus GI/GII NOT DETECTED NOT DETECTED Final   Rotavirus A NOT DETECTED NOT DETECTED Final   Sapovirus (I, II, IV, and V) NOT DETECTED NOT DETECTED Final    Comment: Performed at [facility], [address] [place],  [address]     Medications:    amLODipine  5 mg Oral Daily  cephALEXin  500 mg Oral Q8H   cloNIDine  0.1 mg  Oral BID   docusate sodium  100 mg Oral BID   enoxaparin (LOVENOX) injection  40 mg Subcutaneous Q24H   fenofibrate  54 mg Oral Daily   hydrochlorothiazide  25 mg Oral Daily   hydrocortisone cream   Topical BID   levETIRAcetam  500 mg Oral BID   losartan  100 mg Oral Daily   metoprolol tartrate  50 mg Oral BID   pantoprazole  20 mg Oral Daily   sodium chloride flush  3 mL Intravenous Once   Continuous Infusions:     LOS: 4 days   Charlynne Cousins  [facility]  07/11/2019, 8:18 AM

## 2019-07-11 NOTE — Plan of Care (Signed)

## 2019-07-12 LAB — MAGNESIUM: Magnesium: 1.8 mg/dL (ref 1.7–2.4)

## 2019-07-12 MED ORDER — TRAZODONE HCL 50 MG PO TABS
100.0000 mg | ORAL_TABLET | Freq: Every day | ORAL | Status: DC
  Administered 2019-07-12 – 2019-07-13 (×2): 100 mg via ORAL
  Filled 2019-07-12 (×2): qty 2

## 2019-07-12 NOTE — Progress Notes (Signed)
[facility] PROGRESS NOTE    Progress Note  [name]  OAC:[id number] DOB: 1950-10-29 DOA: 07/07/2019 PCP: Lujean Amel, MD     Brief Narrative:   [name] is an 69 y.o. female past medical history significant for seizures, essential hypertension hyperlipidemia depression with admissions for prior suicide attempt 2012 fibromyalgia cerebral aneurysm status post repair comes in complaining of abdominal pain without vomiting intermittent with watery diarrhea for several months work-up in the ED was suggestive of a UTI, and admitted for question pancreatitis  Assessment/Plan:   Early pancreatitis: Resolved tolerating her diet.  Chronic diarrhea: Resolved.  UTI: Urine culture grew E. coli she will continue her antibiotic treatment as an outpatient.  Acute kidney injury: In the setting of NSAIDs, diuretic and ACE inhibitor these medications were held she was hydrated and it resolved.  Acute on chronic hyponatremia: Likely due to hypovolemia her diuretics were held she was hydrated to resolve. Will resume HCTZ as an outpatient.  With suicidal ideations: Currently not suicidal [facility] was consulted recommended inpatient psychiatric admission.  Essential hypertension: Continue amlodipine and clonidine along with metoprolol. Will resume diuretic and ACE inhibitor.    DVT prophylaxis: lovenox Family Communication:none Status is: Inpatient  Remains inpatient appropriate because:IV treatments appropriate due to intensity of illness or inability to take PO   Dispo: The patient is from: Home              Anticipated d/c is to: Behavioral health inpatient psychiatric admission              Anticipated d/c date is: 1 day              Patient currently is medically stable to d/c.  Patient is awaiting placement to behavioral health.        Code Status:     Code Status Orders  (From admission, onward)         Start     Ordered   07/07/19 2222  Full code   Continuous        07/07/19 2225        Code Status History    Date Active Date Inactive Code Status Order ID Comments User Context   01/06/2011 0426 01/08/2011 2100 Full Code [id number]  Jacinto Reap., MD ED   11/25/2010 1039 11/28/2010 1831 Full Code [id number]  Grier Mitts, RN Inpatient   Advance Care Planning Activity        IV Access:    Peripheral IV   Procedures and diagnostic studies:   No results found.   Medical Consultants:    None.  Anti-Infectives:   rocephin  Subjective:    [name] no complaints.  Objective:    Vitals:   07/10/19 0839 07/11/19 0510 07/11/19 0919 07/11/19 2024  BP: (!) 168/79 (!) 147/66 135/65 (!) 156/77  Pulse: 83 64 84 99  Resp: 18 16 15 17   Temp: 97.6 F (36.4 C) (!) 97.4 F (36.3 C) 97.7 F (36.5 C) 98.4 F (36.9 C)  TempSrc: Oral Oral Oral Oral  SpO2: 99% 97% 93% 96%  Weight:      Height:       SpO2: 96 %  No intake or output data in the 24 hours ending 07/12/19 0820 Filed Weights   07/07/19 1441  Weight: 79.4 kg    Exam: General exam: In no acute distress. Respiratory system: Good air movement and clear to auscultation. Cardiovascular system: S1 & S2 heard, RRR.   Gastrointestinal system: Abdomen  is nondistended, soft and nontender.  Extremities: No pedal edema. Skin: No rashes, lesions or ulcers Psychiatry: Judgement and insight appear normal.    Data Reviewed:    Labs: Basic Metabolic Panel: Recent Labs  Lab 07/07/19 1523 07/08/19 0313 07/09/19 0936 07/10/19 0442 07/11/19 0412 07/12/19 0326  NA 130* 135  --   --   --   --   K 4.0 3.7  --   --   --   --   CL 95* 100  --   --   --   --   CO2 24 25  --   --   --   --   GLUCOSE 108* 94  --   --   --   --   BUN 23 15  --   --   --   --   CREATININE 1.37* 0.88  --   --   --   --   CALCIUM 9.7 8.7*  --   --   --   --   MG  --   --  1.9 1.9 1.8 1.8   GFR Estimated Creatinine Clearance: 62.3 mL/min (by C-G formula based on SCr of  0.88 mg/dL). Liver Function Tests: Recent Labs  Lab 07/07/19 1523 07/08/19 0313  AST 28 25  ALT 23 21  ALKPHOS 44 36*  BILITOT 0.6 0.4  PROT 7.2 5.9*  ALBUMIN 4.2 3.2*   Recent Labs  Lab 07/07/19 1523 07/08/19 0313  LIPASE 26 25   No results for input(s): AMMONIA in the last 168 hours. Coagulation profile No results for input(s): INR, PROTIME in the last 168 hours. COVID-19 Labs  No results for input(s): DDIMER, FERRITIN, LDH, CRP in the last 72 hours.  Lab Results  Component Value Date   [place] NEGATIVE 07/07/2019   [place] Not Detected 12/11/2018    CBC: Recent Labs  Lab 07/07/19 1523  WBC 9.9  HGB 13.7  HCT 40.4  MCV 87.6  PLT 328   Cardiac Enzymes: No results for input(s): CKTOTAL, CKMB, CKMBINDEX, TROPONINI in the last 168 hours. BNP (last 3 results) No results for input(s): PROBNP in the last 8760 hours. CBG: No results for input(s): GLUCAP in the last 168 hours. D-Dimer: No results for input(s): DDIMER in the last 72 hours. Hgb A1c: No results for input(s): HGBA1C in the last 72 hours. Lipid Profile: No results for input(s): CHOL, HDL, LDLCALC, TRIG, CHOLHDL, LDLDIRECT in the last 72 hours. Thyroid function studies: No results for input(s): TSH, T4TOTAL, T3FREE, THYROIDAB in the last 72 hours.  Invalid input(s): FREET3 Anemia work up: No results for input(s): VITAMINB12, FOLATE, FERRITIN, TIBC, IRON, RETICCTPCT in the last 72 hours. Sepsis Labs: Recent Labs  Lab 07/07/19 1523  WBC 9.9   Microbiology Recent Results (from the past 240 hour(s))  Urine culture     Status: Abnormal   Collection Time: 07/07/19  8:57 PM   Specimen: Urine, Random  Result Value Ref Range Status   Specimen Description URINE, RANDOM  Final   Special Requests   Final    NONE Performed at [facility], [address] [place], [place] [address]    Culture >=100,000 COLONIES/mL ESCHERICHIA COLI (A)  Final   Report Status 07/09/2019 FINAL  Final    Organism ID, Bacteria ESCHERICHIA COLI (A)  Final      Susceptibility   Escherichia coli - MIC*    AMPICILLIN >=32 RESISTANT Resistant     CEFAZOLIN <=4 SENSITIVE Sensitive  CEFTRIAXONE <=0.25 SENSITIVE Sensitive     CIPROFLOXACIN >=4 RESISTANT Resistant     GENTAMICIN >=16 RESISTANT Resistant     IMIPENEM <=0.25 SENSITIVE Sensitive     NITROFURANTOIN <=16 SENSITIVE Sensitive     TRIMETH/SULFA <=20 SENSITIVE Sensitive     AMPICILLIN/SULBACTAM 4 SENSITIVE Sensitive     PIP/TAZO <=4 SENSITIVE Sensitive     * >=100,000 COLONIES/mL ESCHERICHIA COLI  SARS Coronavirus 2 by RT PCR (hospital order, performed in [place] hospital lab) Nasopharyngeal Nasopharyngeal Swab     Status: None   Collection Time: 07/07/19  8:57 PM   Specimen: Nasopharyngeal Swab  Result Value Ref Range Status   SARS Coronavirus 2 NEGATIVE NEGATIVE Final    Comment: (NOTE) SARS-CoV-2 target nucleic acids are NOT DETECTED.  The SARS-CoV-2 RNA is generally detectable in upper and lower respiratory specimens during the acute phase of infection. The lowest concentration of SARS-CoV-2 viral copies this assay can detect is 250 copies / mL. A negative result does not preclude SARS-CoV-2 infection and should not be used as the sole basis for treatment or other patient management decisions.  A negative result may occur with improper specimen collection / handling, submission of specimen other than nasopharyngeal swab, presence of viral mutation(s) within the areas targeted by this assay, and inadequate number of viral copies (<250 copies / mL). A negative result must be combined with clinical observations, patient history, and epidemiological information.  Fact Sheet for Patients:   StrictlyIdeas.no  Fact Sheet for Healthcare Providers: BankingDealers.co.za  This test is not yet approved or  cleared by the [place] FDA and has been authorized for detection and/or  diagnosis of SARS-CoV-2 by FDA under an Emergency Use Authorization (EUA).  This EUA will remain in effect (meaning this test can be used) for the duration of the COVID-19 declaration under Section 564(b)(1) of the Act, 21 U.S.C. section 360bbb-3(b)(1), unless the authorization is terminated or revoked sooner.  Performed at [facility], [place] [address] [place], [place] [address]   Gastrointestinal Panel by PCR , Stool     Status: None   Collection Time: 07/09/19 11:16 AM   Specimen: Stool  Result Value Ref Range Status   Campylobacter species NOT DETECTED NOT DETECTED Final   Plesimonas shigelloides NOT DETECTED NOT DETECTED Final   Salmonella species NOT DETECTED NOT DETECTED Final   Yersinia enterocolitica NOT DETECTED NOT DETECTED Final   Vibrio species NOT DETECTED NOT DETECTED Final   Vibrio cholerae NOT DETECTED NOT DETECTED Final   Enteroaggregative E coli (EAEC) NOT DETECTED NOT DETECTED Final   Enteropathogenic E coli (EPEC) NOT DETECTED NOT DETECTED Final   Enterotoxigenic E coli (ETEC) NOT DETECTED NOT DETECTED Final   Shiga like toxin producing E coli (STEC) NOT DETECTED NOT DETECTED Final   Shigella/Enteroinvasive E coli (EIEC) NOT DETECTED NOT DETECTED Final   Cryptosporidium NOT DETECTED NOT DETECTED Final   Cyclospora cayetanensis NOT DETECTED NOT DETECTED Final   Entamoeba histolytica NOT DETECTED NOT DETECTED Final   Giardia lamblia NOT DETECTED NOT DETECTED Final   Adenovirus F40/41 NOT DETECTED NOT DETECTED Final   Astrovirus NOT DETECTED NOT DETECTED Final   Norovirus GI/GII NOT DETECTED NOT DETECTED Final   Rotavirus A NOT DETECTED NOT DETECTED Final   Sapovirus (I, II, IV, and V) NOT DETECTED NOT DETECTED Final    Comment: Performed at [facility], [address] [place], [place] [address]     Medications:   . amLODipine  5 mg  Oral Daily  . cloNIDine  0.1 mg Oral BID  . docusate sodium  100 mg Oral BID  . enoxaparin (LOVENOX)  injection  40 mg Subcutaneous Q24H  . fenofibrate  54 mg Oral Daily  . hydrochlorothiazide  25 mg Oral Daily  . hydrocortisone cream   Topical BID  . levETIRAcetam  500 mg Oral BID  . losartan  100 mg Oral Daily  . metoprolol tartrate  50 mg Oral BID  . pantoprazole  20 mg Oral Daily  . sodium chloride flush  3 mL Intravenous Once  . traZODone  100 mg Oral QHS   Continuous Infusions:     LOS: 5 days   [name]  [facility]  07/12/2019, 8:20 AM

## 2019-07-12 NOTE — Plan of Care (Signed)
  Problem: Health Behavior/Discharge Planning: Goal: Ability to manage health-related needs will improve Outcome: Progressing   Problem: Activity: Goal: Risk for activity intolerance will decrease Outcome: Progressing   Problem: Coping: Goal: Level of anxiety will decrease Outcome: Progressing   Problem: Pain Managment: Goal: General experience of comfort will improve Outcome: Progressing   

## 2019-07-12 NOTE — TOC Progression Note (Addendum)
Transition of Care [facility]) - Progression Note    Patient Details  Name: [name] MRN: [id number] Date of Birth: Nov 30, 1950  [facility]) CM/SW [place], [place] Phone Number: 07/12/2019, 1:10 PM  Clinical Narrative:    CSW contacted [facility] to follow-up on referral. Informed patient was denied due to medical reasons. CSW followed up with [facility], currently no bed availability. Contacted [facility], informed referrals have not been reviewed and they will follow-up if able to take patient. CSW attempted to make contact with [facility] and was unable to make contact with anyone. CSW faxed follow-up referrals to the following behavioral health hospitals for review:  [facility] [phone] [facility] [phone] [facility] [phone]  [facility] team will continue to follow.  Expected Discharge Plan: Psychiatric Hospital Barriers to Discharge: Other (comment) (Awaitng inpatient psych bed)  Expected Discharge Plan and Services Expected Discharge Plan: [facility]         Expected Discharge Date: 07/10/19                                     Social Determinants of Health (SDOH) Interventions    Readmission Risk Interventions No flowsheet data found.

## 2019-07-13 DIAGNOSIS — F4321 Adjustment disorder with depressed mood: Secondary | ICD-10-CM

## 2019-07-13 MED ORDER — IBUPROFEN 200 MG PO TABS
400.0000 mg | ORAL_TABLET | Freq: Four times a day (QID) | ORAL | Status: DC | PRN
  Administered 2019-07-13 – 2019-07-14 (×3): 400 mg via ORAL
  Filled 2019-07-13 (×3): qty 2

## 2019-07-13 MED ORDER — BACLOFEN 10 MG PO TABS
10.0000 mg | ORAL_TABLET | Freq: Every day | ORAL | Status: DC
  Administered 2019-07-13: 10 mg via ORAL
  Filled 2019-07-13: qty 1

## 2019-07-13 NOTE — Plan of Care (Signed)
  Problem: Elimination: Goal: Will not experience complications related to bowel motility Outcome: Progressing Goal: Will not experience complications related to urinary retention Outcome: Progressing   

## 2019-07-13 NOTE — Consult Note (Signed)
[facility] Consult   Reason for Consult:  Depression with suicidal thoughts.  Referring Physician:  Candiss Norse  Patient Identification: Amalea Ottey MRN:  [id number] Principal Diagnosis: Pancreatitis Diagnosis:  Principal Problem:   Pancreatitis Active Problems:   Chronic diarrhea   UTI (urinary tract infection)   AKI (acute kidney injury) ([place])   Depression with suicidal ideation   Adjustment disorder with depressed mood   Total Time spent with patient: 30 minutes  Subjective:  ''I am doing fine now.'' Objective: 69 y.o. female with medical history significant of seizure disorder, hypertension, hyperlipidemia, GERD,fibromyalgia, cerebral aneurysm status post repair, history of abdominal hysterectomy who was admitted due to nausea without vomiting, abdominal pain, and intermittent episodes of watery diarrhea for several months.  Abdominal pain is epigastric, right-sided, and suprapubic. Patient reports that she had a remote history of depression and suicide attempt over 20 years ago, has not taking medications or seen a therapist for over 12 years. However, she recently became depressed after she lost her housing and felt like friends and family abandoned her. Today, she denies psychosis, depression, delusions or self harming thoughts. However, she would like to talk to a Education officer, museum about assistance with getting a place to live. She denies history of drugs and alcohol abuse.  Past Psychiatric History: Remote history of depression and anxiety over 20 years ago-per patient Risk to Self:  denies Risk to Others:  No Prior Inpatient Therapy:  Yes Prior Outpatient Therapy:  No  Past Medical History:  Past Medical History:  Diagnosis Date  . Aneurysm ([place])    brain  . Anxiety   . Arthritis   . Depression   . Fibromyalgia   . GERD (gastroesophageal reflux disease)   . Hyperlipidemia    pt. thought she was placed on medication for cholesterol but does not know for sure.   . Hypertension   . Insomnia   . Seizures ([place])    [id number]    Past Surgical History:  Procedure Laterality Date  . ABDOMINAL HYSTERECTOMY    . BREAST CYST EXCISION Right   . CEREBRAL ANEURYSM REPAIR    . CEREBRAL ANEURYSM REPAIR     clipped   . COLONOSCOPY     12+ years ago [place]  . POLYPECTOMY     Family History:  Family History  Problem Relation Age of Onset  . Heart disease Mother   . Heart disease Father   . Cancer Brother   . Arthritis Brother   . Colon cancer Paternal Grandfather   . Esophageal cancer Son   . Colon polyps Neg Hx   . Rectal cancer Neg Hx   . Stomach cancer Neg Hx    Family Psychiatric  History: As per patient maternal great aunt with completed suicide Social History:  Social History   Substance and Sexual Activity  Alcohol Use No     Social History   Substance and Sexual Activity  Drug Use No    Social History   Socioeconomic History  . Marital status: Divorced    Spouse name: Not on file  . Number of children: Not on file  . Years of education: Not on file  . Highest education level: Not on file  Occupational History  . Not on file  Tobacco Use  . Smoking status: Former Smoker    Quit date: 1985    Years since quitting: 36.4  . Smokeless tobacco: Never Used  Substance and Sexual Activity  . Alcohol use: No  .  Drug use: No  . Sexual activity: Not Currently  Other Topics Concern  . Not on file  Social History Narrative  . Not on file   Social Determinants of Health   Financial Resource Strain:   . Difficulty of Paying Living Expenses:   Food Insecurity:   . Worried About Charity fundraiser in the Last Year:   . Arboriculturist in the Last Year:   Transportation Needs:   . Film/video editor (Medical):   Marland Kitchen Lack of Transportation (Non-Medical):   Physical Activity:   . Days of Exercise per Week:   . Minutes of Exercise per Session:   Stress:   . Feeling of Stress :   Social Connections:   . Frequency of  Communication with Friends and Family:   . Frequency of Social Gatherings with Friends and Family:   . Attends Religious Services:   . Active Member of Clubs or Organizations:   . Attends Archivist Meetings:   Marland Kitchen Marital Status:    Additional Social History:    Allergies:   Allergies  Allergen Reactions  . Barium-Containing Compounds Nausea And Vomiting  . Eggs Or Egg-Derived Products Nausea And Vomiting  . Red Dye Nausea And Vomiting    Labs:  Results for orders placed or performed during the hospital encounter of 07/07/19 (from the past 48 hour(s))  Magnesium     Status: None   Collection Time: 07/12/19  3:26 AM  Result Value Ref Range   Magnesium 1.8 1.7 - 2.4 mg/dL    Comment: Performed at [facility], [facility] [address] [place], [place] [address]    Current Facility-Administered Medications  Medication Dose Route Frequency Provider Last Rate Last Admin  . acetaminophen (TYLENOL) tablet 650 mg  650 mg Oral Q6H PRN Shela Leff, MD   650 mg at 07/13/19 0943  . amLODipine (NORVASC) tablet 5 mg  5 mg Oral Daily Shela Leff, MD   5 mg at 07/13/19 0935  . baclofen (LIORESAL) tablet 10 mg  10 mg Oral QHS Charlynne Cousins, MD      . cloNIDine (CATAPRES) tablet 0.1 mg  0.1 mg Oral BID Shela Leff, MD   0.1 mg at 07/13/19 0935  . diphenhydrAMINE (BENADRYL) capsule 25 mg  25 mg Oral Q6H PRN Charlynne Cousins, MD   25 mg at 07/13/19 1030  . docusate sodium (COLACE) capsule 100 mg  100 mg Oral BID Thurnell Lose, MD   100 mg at 07/09/19 2122  . enoxaparin (LOVENOX) injection 40 mg  40 mg Subcutaneous Q24H Shela Leff, MD   40 mg at 07/08/19 2149  . fenofibrate tablet 54 mg  54 mg Oral Daily Shela Leff, MD   54 mg at 07/13/19 0935  . hydrochlorothiazide (HYDRODIURIL) tablet 25 mg  25 mg Oral Daily Charlynne Cousins, MD   25 mg at 07/13/19 0935  . hydrocortisone cream 1 %   Topical BID Charlynne Cousins, MD   Given at  07/12/19 2112  . ibuprofen (ADVIL) tablet 400 mg  400 mg Oral Q6H PRN Charlynne Cousins, MD   400 mg at 07/13/19 1025  . levETIRAcetam (KEPPRA) tablet 500 mg  500 mg Oral BID Shela Leff, MD   500 mg at 07/13/19 0612  . losartan (COZAAR) tablet 100 mg  100 mg Oral Daily Charlynne Cousins, MD   100 mg at 07/13/19 0935  . metoprolol tartrate (LOPRESSOR) tablet 50 mg  50  mg Oral BID Shela Leff, MD   50 mg at 07/13/19 0936  . morphine 2 MG/ML injection 1 mg  1 mg Intravenous Q4H PRN Shela Leff, MD   1 mg at 07/12/19 2235  . ondansetron (ZOFRAN) injection 4 mg  4 mg Intravenous Q6H PRN Shela Leff, MD      . pantoprazole (PROTONIX) EC tablet 20 mg  20 mg Oral Daily Shela Leff, MD   20 mg at 07/13/19 0935  . sodium chloride flush (NS) 0.9 % injection 3 mL  3 mL Intravenous Once Curatolo, Adam, DO      . traZODone (DESYREL) tablet 100 mg  100 mg Oral QHS Charlynne Cousins, MD   100 mg at 07/12/19 2111    Musculoskeletal: Strength & Muscle Tone: within normal limits Gait & Station: normal Patient leans: N/A  Psychiatric Specialty Exam: Physical Exam  Constitutional: She is cooperative.  Psychiatric: Her speech is normal. Mood, affect and thought content normal.    Review of Systems  Constitutional: Negative.   HENT: Negative.   Eyes: Negative.   Respiratory: Negative.   Gastrointestinal: Negative.   Endocrine: Negative.   Genitourinary: Negative.   Skin: Negative.   Neurological: Negative.   Hematological: Negative.   Psychiatric/Behavioral: Negative.     Blood pressure 113/75, pulse 78, temperature (!) 97.5 F (36.4 C), temperature source Oral, resp. rate 17, height 5\' 6"  (1.676 m), weight 79.4 kg, SpO2 97 %.Body mass index is 28.25 kg/m.  General Appearance: Casual  Eye Contact:  Good  Speech:  Clear and Coherent and Normal Rate  Volume:  Normal  Mood:  Euthymic  Affect:  Congruent  Thought Process:  Coherent and Linear   Orientation:  Full (Time, Place, and Person)  Thought Content:  Logical  Suicidal Thoughts:  No  Homicidal Thoughts:  No  Memory:  Immediate;   Good Recent;   Good Remote;   Good  Judgement:  Intact  Insight:  Fair  Psychomotor Activity:  Normal  Concentration:  Concentration: Fair and Attention Span: Fair  Recall:  [employer] of Knowledge:  Good  Language:  Good  Akathisia:  No  Handed:  Right  AIMS (if indicated):     Assets:  Communication Skills Desire for Improvement  ADL's:  Intact  Cognition:  WNL  Sleep:   fine     Treatment Plan Summary:  69 year old female with remote history of depression, Anxiety who was admitted for medical reasons. However, she verbalized depression and suicidal thoughts on admission. Today, patient denies depression, suicidal thoughts, psychosis or delusions. She reports that she became depressed after she lost her housing and will be fine as long as she can get help regarding getting a place to live. She is reluctant to try an anti-depressant for now. Based on my evaluation today and information obtained from the patient, she no longer meet criteria for inpatient psychiatric admission.  Recommendations: -Consider social worker consult to discuss housing with patient. -Consider referring patient a geriatric outpatient psychiatrist. -Patient is cleared by psychiatrist   Disposition: No evidence of imminent risk to self or others at present.   Patient does not meet criteria for psychiatric inpatient admission. Supportive therapy provided about ongoing stressors. Psychiatric service signing out. Re-consult as needed  Corena Pilgrim, MD 07/13/2019 11:46 AM

## 2019-07-13 NOTE — Progress Notes (Signed)
[facility] PROGRESS NOTE    Progress Note  [name]  UMP:[id number] DOB: May 05, 1950 DOA: 07/07/2019 PCP: Lujean Amel, MD     Brief Narrative:   [name] is an 69 y.o. female past medical history significant for seizures, essential hypertension hyperlipidemia depression with admissions for prior suicide attempt 2012 fibromyalgia cerebral aneurysm status post repair comes in complaining of abdominal pain without vomiting intermittent with watery diarrhea for several months work-up in the ED was suggestive of a UTI, and admitted for question pancreatitis  Assessment/Plan:   Early pancreatitis: Resolved tolerating her diet.  Chronic diarrhea: Resolved.  UTI: Urine culture grew E. coli she has completed treatment in house.  Acute kidney injury: In the setting of NSAIDs, diuretic and ACE inhibitor these medications were held she was hydrated and it resolved.  Acute on chronic hyponatremia: Likely due to hypovolemia her diuretics were held she was hydrated to resolve. Will resume HCTZ as an outpatient.  Suicidal ideations: We will reconsult psychiatry to reevaluate she still pending for placement to behavioral health.  Essential hypertension: Continue amlodipine and clonidine along with metoprolol. Will resume diuretic and ACE inhibitor.    DVT prophylaxis: lovenox Family Communication:none Status is: Inpatient  Remains inpatient appropriate because:IV treatments appropriate due to intensity of illness or inability to take PO   Dispo: The patient is from: Home              Anticipated d/c is to: Behavioral health inpatient psychiatric admission              Anticipated d/c date is: 1 day              Patient currently is medically stable to d/c.  Patient is awaiting placement to behavioral health.        Code Status:     Code Status Orders  (From admission, onward)         Start     Ordered   07/07/19 2222  Full code  Continuous         07/07/19 2225        Code Status History    Date Active Date Inactive Code Status Order ID Comments User Context   01/06/2011 0426 01/08/2011 2100 Full Code [id number]  Jacinto Reap., MD ED   11/25/2010 1039 11/28/2010 1831 Full Code [id number]  Grier Mitts, RN Inpatient   Advance Care Planning Activity        IV Access:    Peripheral IV   Procedures and diagnostic studies:   No results found.   Medical Consultants:    None.  Anti-Infectives:   rocephin  Subjective:    [name] no complaints.  Objective:    Vitals:   07/12/19 0950 07/12/19 1334 07/12/19 1953 07/13/19 0536  BP: 139/66 (!) 102/57 113/60 121/68  Pulse: 98 67 72 68  Resp: 18 18 16 16   Temp: 98.4 F (36.9 C) 97.7 F (36.5 C) 97.6 F (36.4 C) 97.8 F (36.6 C)  TempSrc: Oral Oral Oral Oral  SpO2: 97% 97% 95% 97%  Weight:      Height:       SpO2: 97 %  No intake or output data in the 24 hours ending 07/13/19 0905 Filed Weights   07/07/19 1441  Weight: 79.4 kg    Exam: General exam: In no acute distress. Respiratory system: Good air movement and clear to auscultation. Cardiovascular system: S1 & S2 heard, RRR.   Gastrointestinal system: Abdomen is nondistended, soft  and nontender.  Extremities: No pedal edema. Skin: No rashes, lesions or ulcers Psychiatry: Judgement and insight appear normal.    Data Reviewed:    Labs: Basic Metabolic Panel: Recent Labs  Lab 07/07/19 1523 07/08/19 0313 07/09/19 0936 07/10/19 0442 07/11/19 0412 07/12/19 0326  NA 130* 135  --   --   --   --   K 4.0 3.7  --   --   --   --   CL 95* 100  --   --   --   --   CO2 24 25  --   --   --   --   GLUCOSE 108* 94  --   --   --   --   BUN 23 15  --   --   --   --   CREATININE 1.37* 0.88  --   --   --   --   CALCIUM 9.7 8.7*  --   --   --   --   MG  --   --  1.9 1.9 1.8 1.8   GFR Estimated Creatinine Clearance: 62.3 mL/min (by C-G formula based on SCr of 0.88 mg/dL). Liver Function  Tests: Recent Labs  Lab 07/07/19 1523 07/08/19 0313  AST 28 25  ALT 23 21  ALKPHOS 44 36*  BILITOT 0.6 0.4  PROT 7.2 5.9*  ALBUMIN 4.2 3.2*   Recent Labs  Lab 07/07/19 1523 07/08/19 0313  LIPASE 26 25   No results for input(s): AMMONIA in the last 168 hours. Coagulation profile No results for input(s): INR, PROTIME in the last 168 hours. COVID-19 Labs  No results for input(s): DDIMER, FERRITIN, LDH, CRP in the last 72 hours.  Lab Results  Component Value Date   [place] NEGATIVE 07/07/2019   [place] Not Detected 12/11/2018    CBC: Recent Labs  Lab 07/07/19 1523  WBC 9.9  HGB 13.7  HCT 40.4  MCV 87.6  PLT 328   Cardiac Enzymes: No results for input(s): CKTOTAL, CKMB, CKMBINDEX, TROPONINI in the last 168 hours. BNP (last 3 results) No results for input(s): PROBNP in the last 8760 hours. CBG: No results for input(s): GLUCAP in the last 168 hours. D-Dimer: No results for input(s): DDIMER in the last 72 hours. Hgb A1c: No results for input(s): HGBA1C in the last 72 hours. Lipid Profile: No results for input(s): CHOL, HDL, LDLCALC, TRIG, CHOLHDL, LDLDIRECT in the last 72 hours. Thyroid function studies: No results for input(s): TSH, T4TOTAL, T3FREE, THYROIDAB in the last 72 hours.  Invalid input(s): FREET3 Anemia work up: No results for input(s): VITAMINB12, FOLATE, FERRITIN, TIBC, IRON, RETICCTPCT in the last 72 hours. Sepsis Labs: Recent Labs  Lab 07/07/19 1523  WBC 9.9   Microbiology Recent Results (from the past 240 hour(s))  Urine culture     Status: Abnormal   Collection Time: 07/07/19  8:57 PM   Specimen: Urine, Random  Result Value Ref Range Status   Specimen Description URINE, RANDOM  Final   Special Requests   Final    NONE Performed at [facility], [address], [place],  [address]    Culture >=100,000 COLONIES/mL ESCHERICHIA COLI (A)  Final   Report Status 07/09/2019 FINAL  Final   Organism ID, Bacteria  ESCHERICHIA COLI (A)  Final      Susceptibility   Escherichia coli - MIC*    AMPICILLIN >=32 RESISTANT Resistant     CEFAZOLIN <=4 SENSITIVE Sensitive  CEFTRIAXONE <=0.25 SENSITIVE Sensitive     CIPROFLOXACIN >=4 RESISTANT Resistant     GENTAMICIN >=16 RESISTANT Resistant     IMIPENEM <=0.25 SENSITIVE Sensitive     NITROFURANTOIN <=16 SENSITIVE Sensitive     TRIMETH/SULFA <=20 SENSITIVE Sensitive     AMPICILLIN/SULBACTAM 4 SENSITIVE Sensitive     PIP/TAZO <=4 SENSITIVE Sensitive     * >=100,000 COLONIES/mL ESCHERICHIA COLI  SARS Coronavirus 2 by RT PCR (hospital order, performed in [facility] hospital lab) Nasopharyngeal Nasopharyngeal Swab     Status: None   Collection Time: 07/07/19  8:57 PM   Specimen: Nasopharyngeal Swab  Result Value Ref Range Status   SARS Coronavirus 2 NEGATIVE NEGATIVE Final    Comment: (NOTE) SARS-CoV-2 target nucleic acids are NOT DETECTED.  The SARS-CoV-2 RNA is generally detectable in upper and lower respiratory specimens during the acute phase of infection. The lowest concentration of SARS-CoV-2 viral copies this assay can detect is 250 copies / mL. A negative result does not preclude SARS-CoV-2 infection and should not be used as the sole basis for treatment or other patient management decisions.  A negative result may occur with improper specimen collection / handling, submission of specimen other than nasopharyngeal swab, presence of viral mutation(s) within the areas targeted by this assay, and inadequate number of viral copies (<250 copies / mL). A negative result must be combined with clinical observations, patient history, and epidemiological information.  Fact Sheet for Patients:   StrictlyIdeas.no  Fact Sheet for Healthcare Providers: BankingDealers.co.za  This test is not yet approved or  cleared by the [place] FDA and has been authorized for detection and/or diagnosis of  SARS-CoV-2 by FDA under an Emergency Use Authorization (EUA).  This EUA will remain in effect (meaning this test can be used) for the duration of the COVID-19 declaration under Section 564(b)(1) of the Act, 21 U.S.C. section 360bbb-3(b)(1), unless the authorization is terminated or revoked sooner.  Performed at [facility], [place] [address], [place], [place] [address]   Gastrointestinal Panel by PCR , Stool     Status: None   Collection Time: 07/09/19 11:16 AM   Specimen: Stool  Result Value Ref Range Status   Campylobacter species NOT DETECTED NOT DETECTED Final   Plesimonas shigelloides NOT DETECTED NOT DETECTED Final   Salmonella species NOT DETECTED NOT DETECTED Final   Yersinia enterocolitica NOT DETECTED NOT DETECTED Final   Vibrio species NOT DETECTED NOT DETECTED Final   Vibrio cholerae NOT DETECTED NOT DETECTED Final   Enteroaggregative E coli (EAEC) NOT DETECTED NOT DETECTED Final   Enteropathogenic E coli (EPEC) NOT DETECTED NOT DETECTED Final   Enterotoxigenic E coli (ETEC) NOT DETECTED NOT DETECTED Final   Shiga like toxin producing E coli (STEC) NOT DETECTED NOT DETECTED Final   Shigella/Enteroinvasive E coli (EIEC) NOT DETECTED NOT DETECTED Final   Cryptosporidium NOT DETECTED NOT DETECTED Final   Cyclospora cayetanensis NOT DETECTED NOT DETECTED Final   Entamoeba histolytica NOT DETECTED NOT DETECTED Final   Giardia lamblia NOT DETECTED NOT DETECTED Final   Adenovirus F40/41 NOT DETECTED NOT DETECTED Final   Astrovirus NOT DETECTED NOT DETECTED Final   Norovirus GI/GII NOT DETECTED NOT DETECTED Final   Rotavirus A NOT DETECTED NOT DETECTED Final   Sapovirus (I, II, IV, and V) NOT DETECTED NOT DETECTED Final    Comment: Performed at [facility], [address] [place], [place] [address]     Medications:    amLODipine  5 mg  Oral Daily   baclofen  10 mg Oral QHS   cloNIDine  0.1 mg Oral BID   docusate sodium  100 mg Oral BID   enoxaparin  (LOVENOX) injection  40 mg Subcutaneous Q24H   fenofibrate  54 mg Oral Daily   hydrochlorothiazide  25 mg Oral Daily   hydrocortisone cream   Topical BID   levETIRAcetam  500 mg Oral BID   losartan  100 mg Oral Daily   metoprolol tartrate  50 mg Oral BID   pantoprazole  20 mg Oral Daily   sodium chloride flush  3 mL Intravenous Once   traZODone  100 mg Oral QHS   Continuous Infusions:     LOS: 6 days   Charlynne Cousins  [facility]  07/13/2019, 9:05 AM

## 2019-07-13 NOTE — TOC Transition Note (Addendum)
Transition of Care [facility]) - CM/SW Discharge Note   Patient Details  Name: [name] MRN: [id number] Date of Birth: 01/09/1951  [facility] CM/SW Contact:  Jacquelynn Cree Phone Number: 07/13/2019, 10:21 AM   Clinical Narrative:    Update: Patient cleared by psychiatry. CSW contacted Caryl Pina with [employer] to provide update. CSW consulted for housing resources, met with patient bedside to discuss housing needs. Patient shared she has home but will need to move soon and would like somewhere safe. Patient inquired on independent living housing. CSW provided patient with a [place] of independent living communities [place] Patient expressed appreciation and expressed she has no other SW needs at this time.  CSW received follow-up call from [facility] inquiring on patient still needing placement. Caryl Pina agreed to review referral and will follow-up with CSW.  CSW met with patient bedside and introduced self and role. CSW asked patient if she is still in agreement with voluntary psych placement. Patient expressed if that is the recommendation she is in agreement. CSW explained placement may be available in [place]. Patient was very adamant that she will not go to [place] or [place]. CSW explained [place] does not have placement availability and [place] specializes in geriatric population. Patient continued to state she will not go to [place] or [place]. CSW expressed the first placement available will be considered.   CSW updated MD on patient's reluctance to be placed at the stated facilities. CSW will continue to follow.    Barriers to Discharge: Other (comment) (Awaitng inpatient psych bed)   Patient Goals and CMS Choice        Discharge Placement                       Discharge Plan and Services                                     Social Determinants of Health  (SDOH) Interventions     Readmission Risk Interventions No flowsheet data found.

## 2019-07-14 MED ORDER — TRAZODONE HCL 100 MG PO TABS: 100.0000 mg | ORAL_TABLET | Freq: Every day | ORAL | 0 refills | Status: DC

## 2019-07-14 NOTE — Discharge Summary (Signed)
Physician Discharge Summary  [name] TDV:[id number] DOB: May 28, 1950 DOA: 07/07/2019  PCP: Lujean Amel, MD  Admit date: 07/07/2019 Discharge date: 07/14/2019  Admitted From: Home Disposition: Behavioral health   Recommendations for Outpatient Follow-up:  1. Follow up with GI in 1-2 weeks, check a CBC. 2. Follow-up with geriatric psych as an outpatient.  Home Health:No Equipment/Devices:None  Discharge Condition:Stable CODE STATUS:Full Diet recommendation: Heart Healthy  Brief/Interim Summary: 69 y.o. female past medical history significant for seizures, essential hypertension hyperlipidemia depression with admissions for prior suicide attempt 2012 fibromyalgia cerebral aneurysm status post repair comes in complaining of abdominal pain without vomiting intermittent with watery diarrhea for several months work-up in the ED was suggestive of a UTI, and admitted for question pancreatitis   Discharge Diagnoses:  Principal Problem:   Pancreatitis Active Problems:   Chronic diarrhea   UTI (urinary tract infection)   AKI (acute kidney injury) ([place])   Depression with suicidal ideation   Adjustment disorder with depressed mood  Early pancreatitis: Abdominal ultrasound showed no CBD stone, CT showed possible inflammation in the distal tail of the pancreas.  She was treated conservatively with IV fluids and pain medication. She denies any alcohol abuse no changes or new medication she will follow up with GI as an outpatient. She is on HCTZ which was discontinued and she will follow up with PCP as an outpatient for blood pressure and titrate antihypertensive medications as needed.  Chronic diarrhea: Has remained afebrile with no leukocytosis.  No signs of calcification on CT to question chronic pancreatitis. The diarrhea resolved.  Acute UTI: Urine cultures show more than 100,000 colonies of E. coli she was started empirically on Rocephin on admission now transition to oral Keflex  which she will continue for 2 additional days.  Acute kidney injury: In the setting of NSAIDs, diuretic and ACE inhibitor these were held she was hydrated her creatinine returned to baseline.  Acute on chronic hyponatremia: Likely due to hypovolemia is resolved with IV fluid hydration.  HCTZ was discontinued as this could cause pancreatitis and her hyponatremia was attributed to this. Follow-up with PCP and start medications as needed.  Suicidal ideation: Initially psychiatry was consulted and recommended inpatient psych as she want to harm herself, reevaluation by psych determined that the patient was not suicidal, she had no plans to hurt others or herself. The recommended outpatient psychiatric evaluation. The patient is homeless at this point and has no place to go.  Essential hypertension: Hydrochlorothiazide was discontinued she will continue amlodipine clonidine, metoprolol and will resume her ACE inhibitor.  Follow-up with PCP as an outpatient and titrate as antihypertensive medication as needed.  Discharge Instructions  Discharge Instructions    Diet - low sodium heart healthy   Complete by: As directed    Diet - low sodium heart healthy   Complete by: As directed    Increase activity slowly   Complete by: As directed    Increase activity slowly   Complete by: As directed      Allergies as of 07/14/2019      Reactions   Barium-containing Compounds Nausea And Vomiting   Eggs Or Egg-derived Products Nausea And Vomiting   Red Dye Nausea And Vomiting      Medication List    STOP taking these medications   hydrochlorothiazide 25 MG tablet Commonly known as: HYDRODIURIL   naproxen sodium 220 MG tablet Commonly known as: ALEVE     TAKE these medications   acetaminophen 500 MG tablet Commonly known  as: TYLENOL Take 500-1,000 mg by mouth 2 (two) times daily.   amLODipine 5 MG tablet Commonly known as: NORVASC Take 5 mg by mouth daily.   baclofen 10 MG  tablet Commonly known as: LIORESAL Take 10 mg by mouth at bedtime.   CALCIUM-D PO Take 1 tablet by mouth daily.   cephALEXin 500 MG capsule Commonly known as: KEFLEX Take 1 capsule (500 mg total) by mouth every 8 (eight) hours.   cloNIDine 0.1 MG tablet Commonly known as: CATAPRES TAKE 1 TABLET(0.1 MG) BY MOUTH TWICE DAILY What changed: See the new instructions.   fenofibrate 54 MG tablet Take 54 mg by mouth daily.   hydrocortisone cream 1 % Apply 1 application topically 2 (two) times daily as needed for itching (eczema).   levETIRAcetam 500 MG tablet Commonly known as: KEPPRA Take 1 tablet (500 mg total) by mouth 2 (two) times daily.   losartan 100 MG tablet Commonly known as: COZAAR Take 100 mg by mouth daily.   metoprolol tartrate 50 MG tablet Commonly known as: LOPRESSOR Take 50 mg by mouth 2 (two) times daily.   omeprazole 20 MG capsule Commonly known as: PRILOSEC Take 20 mg by mouth at bedtime.   traZODone 100 MG tablet Commonly known as: DESYREL Take 1 tablet (100 mg total) by mouth at bedtime.       Allergies  Allergen Reactions   Barium-Containing Compounds Nausea And Vomiting   Eggs Or Egg-Derived Products Nausea And Vomiting   Red Dye Nausea And Vomiting    Consultations: None  Procedures/Studies: CT ABDOMEN PELVIS W CONTRAST  Result Date: 07/07/2019 CLINICAL DATA:  Intermittent nausea EXAM: CT ABDOMEN AND PELVIS WITH CONTRAST TECHNIQUE: Multidetector CT imaging of the abdomen and pelvis was performed using the standard protocol following bolus administration of intravenous contrast. CONTRAST:  80 mL OMNIPAQUE IOHEXOL 300 MG/ML  SOLN COMPARISON:  None. FINDINGS: Lower chest: Lung bases demonstrate no acute consolidation or pleural effusion. Small blebs or cysts in the posterior left lung base with scarring. Small hiatal hernia. Hepatobiliary: No focal liver abnormality is seen. No gallstones, gallbladder wall thickening, or biliary dilatation.  Pancreas: Edema and soft tissue stranding about the tail of the pancreas. No ductal dilatation Spleen: Normal in size without focal abnormality. Adrenals/Urinary Tract: Adrenal glands are unremarkable. Kidneys are normal, without renal calculi, focal lesion, or hydronephrosis. Bladder is unremarkable. Stomach/Bowel: Stomach is within normal limits. Appendix appears normal. No evidence of bowel wall thickening, distention, or inflammatory changes. Vascular/Lymphatic: Mild aortic atherosclerosis without aneurysm. No suspicious adenopathy Reproductive: Status post hysterectomy. No adnexal masses. Other: Negative for free air or free fluid. Musculoskeletal: No acute or significant osseous findings. IMPRESSION: 1. Focal inflammatory process within the left upper quadrant, appears to be centered about the tail of the pancreas and suggests possible acute pancreatitis. Recommend correlation with appropriate enzymes. 2. Otherwise no CT evidence for acute intra-abdominal or pelvic abnormality. Electronically Signed   By: Donavan Foil M.D.   On: 07/07/2019 19:52   US Abdomen Limited RUQ  Result Date: 07/07/2019 CLINICAL DATA:  Pancreatitis. EXAM: ULTRASOUND ABDOMEN LIMITED RIGHT UPPER QUADRANT COMPARISON:  Abdominal CT earlier today. FINDINGS: Gallbladder: Distended. No gallstones or wall thickening visualized. No sonographic Murphy sign noted by sonographer. Common bile duct: Diameter: 3-4 mm, normal. Liver: No focal lesion identified. Heterogeneously increased in parenchymal echogenicity. Portal vein is patent on color Doppler imaging with normal direction of blood flow towards the liver. Other: None. IMPRESSION: 1. No gallstones or biliary dilatation. 2. Hepatic steatosis.  Electronically Signed   By: Keith Rake M.D.   On: 07/07/2019 23:43    Subjective: No complains  Discharge Exam: Vitals:   07/14/19 0422 07/14/19 0802  BP: 121/60 123/65  Pulse: 69 62  Resp: 16   Temp: 98.1 F (36.7 C) (!) 97.4 F  (36.3 C)  SpO2: 96% 99%   Vitals:   07/13/19 0930 07/13/19 1915 07/14/19 0422 07/14/19 0802  BP: 113/75 (!) 117/57 121/60 123/65  Pulse: 78 73 69 62  Resp: 17 16 16    Temp: (!) 97.5 F (36.4 C) 97.6 F (36.4 C) 98.1 F (36.7 C) (!) 97.4 F (36.3 C)  TempSrc: Oral Oral Oral Oral  SpO2: 97% 95% 96% 99%  Weight:      Height:        General: Pt is alert, awake, not in acute distress Cardiovascular: RRR, S1/S2 +, no rubs, no gallops Respiratory: CTA bilaterally, no wheezing, no rhonchi Abdominal: Soft, NT, ND, bowel sounds + Extremities: no edema, no cyanosis    The results of significant diagnostics from this hospitalization (including imaging, microbiology, ancillary and laboratory) are listed below for reference.     Microbiology: Recent Results (from the past 240 hour(s))  Urine culture     Status: Abnormal   Collection Time: 07/07/19  8:57 PM   Specimen: Urine, Random  Result Value Ref Range Status   Specimen Description URINE, RANDOM  Final   Special Requests   Final    NONE Performed at [facility], [address], [place], [place] [address]    Culture >=100,000 COLONIES/mL ESCHERICHIA COLI (A)  Final   Report Status 07/09/2019 FINAL  Final   Organism ID, Bacteria ESCHERICHIA COLI (A)  Final      Susceptibility   Escherichia coli - MIC*    AMPICILLIN >=32 RESISTANT Resistant     CEFAZOLIN <=4 SENSITIVE Sensitive     CEFTRIAXONE <=0.25 SENSITIVE Sensitive     CIPROFLOXACIN >=4 RESISTANT Resistant     GENTAMICIN >=16 RESISTANT Resistant     IMIPENEM <=0.25 SENSITIVE Sensitive     NITROFURANTOIN <=16 SENSITIVE Sensitive     TRIMETH/SULFA <=20 SENSITIVE Sensitive     AMPICILLIN/SULBACTAM 4 SENSITIVE Sensitive     PIP/TAZO <=4 SENSITIVE Sensitive     * >=100,000 COLONIES/mL ESCHERICHIA COLI  SARS Coronavirus 2 by RT PCR (hospital order, performed in [facility] hospital lab) Nasopharyngeal Nasopharyngeal Swab     Status: None   Collection Time: 07/07/19   8:57 PM   Specimen: Nasopharyngeal Swab  Result Value Ref Range Status   SARS Coronavirus 2 NEGATIVE NEGATIVE Final    Comment: (NOTE) SARS-CoV-2 target nucleic acids are NOT DETECTED.  The SARS-CoV-2 RNA is generally detectable in upper and lower respiratory specimens during the acute phase of infection. The lowest concentration of SARS-CoV-2 viral copies this assay can detect is 250 copies / mL. A negative result does not preclude SARS-CoV-2 infection and should not be used as the sole basis for treatment or other patient management decisions.  A negative result may occur with improper specimen collection / handling, submission of specimen other than nasopharyngeal swab, presence of viral mutation(s) within the areas targeted by this assay, and inadequate number of viral copies (<250 copies / mL). A negative result must be combined with clinical observations, patient history, and epidemiological information.  Fact Sheet for Patients:   StrictlyIdeas.no  Fact Sheet for Healthcare Providers: BankingDealers.co.za  This test is not yet approved or  cleared by the  [place] Architectural technologist and has been authorized for detection and/or diagnosis of SARS-CoV-2 by FDA under an Print production planner (EUA).  This EUA will remain in effect (meaning this test can be used) for the duration of the COVID-19 declaration under Section 564(b)(1) of the Act, 21 U.S.C. section 360bbb-3(b)(1), unless the authorization is terminated or revoked sooner.  Performed at [facility], [place] [address], [place], [place] [address]   Gastrointestinal Panel by PCR , Stool     Status: None   Collection Time: 07/09/19 11:16 AM   Specimen: Stool  Result Value Ref Range Status   Campylobacter species NOT DETECTED NOT DETECTED Final   Plesimonas shigelloides NOT DETECTED NOT DETECTED Final   Salmonella species NOT DETECTED NOT DETECTED Final   Yersinia  enterocolitica NOT DETECTED NOT DETECTED Final   Vibrio species NOT DETECTED NOT DETECTED Final   Vibrio cholerae NOT DETECTED NOT DETECTED Final   Enteroaggregative E coli (EAEC) NOT DETECTED NOT DETECTED Final   Enteropathogenic E coli (EPEC) NOT DETECTED NOT DETECTED Final   Enterotoxigenic E coli (ETEC) NOT DETECTED NOT DETECTED Final   Shiga like toxin producing E coli (STEC) NOT DETECTED NOT DETECTED Final   Shigella/Enteroinvasive E coli (EIEC) NOT DETECTED NOT DETECTED Final   Cryptosporidium NOT DETECTED NOT DETECTED Final   Cyclospora cayetanensis NOT DETECTED NOT DETECTED Final   Entamoeba histolytica NOT DETECTED NOT DETECTED Final   Giardia lamblia NOT DETECTED NOT DETECTED Final   Adenovirus F40/41 NOT DETECTED NOT DETECTED Final   Astrovirus NOT DETECTED NOT DETECTED Final   Norovirus GI/GII NOT DETECTED NOT DETECTED Final   Rotavirus A NOT DETECTED NOT DETECTED Final   Sapovirus (I, II, IV, and V) NOT DETECTED NOT DETECTED Final    Comment: Performed at [facility], [address] [place], [place] [address]     Labs: BNP (last 3 results) No results for input(s): BNP in the last 8760 hours. Basic Metabolic Panel: Recent Labs  Lab 07/07/19 1523 07/08/19 0313 07/09/19 0936 07/10/19 0442 07/11/19 0412 07/12/19 0326  NA 130* 135  --   --   --   --   K 4.0 3.7  --   --   --   --   CL 95* 100  --   --   --   --   CO2 24 25  --   --   --   --   GLUCOSE 108* 94  --   --   --   --   BUN 23 15  --   --   --   --   CREATININE 1.37* 0.88  --   --   --   --   CALCIUM 9.7 8.7*  --   --   --   --   MG  --   --  1.9 1.9 1.8 1.8   Liver Function Tests: Recent Labs  Lab 07/07/19 1523 07/08/19 0313  AST 28 25  ALT 23 21  ALKPHOS 44 36*  BILITOT 0.6 0.4  PROT 7.2 5.9*  ALBUMIN 4.2 3.2*   Recent Labs  Lab 07/07/19 1523 07/08/19 0313  LIPASE 26 25   No results for input(s): AMMONIA in the last 168 hours. CBC: Recent Labs  Lab 07/07/19 1523  WBC  9.9  HGB 13.7  HCT 40.4  MCV 87.6  PLT 328   Cardiac Enzymes: No results for input(s): CKTOTAL, CKMB, CKMBINDEX, TROPONINI in the last 168 hours. BNP: Invalid input(s):  POCBNP CBG: No results for input(s): GLUCAP in the last 168 hours. D-Dimer No results for input(s): DDIMER in the last 72 hours. Hgb A1c No results for input(s): HGBA1C in the last 72 hours. Lipid Profile No results for input(s): CHOL, HDL, LDLCALC, TRIG, CHOLHDL, LDLDIRECT in the last 72 hours. Thyroid function studies No results for input(s): TSH, T4TOTAL, T3FREE, THYROIDAB in the last 72 hours.  Invalid input(s): FREET3 Anemia work up No results for input(s): VITAMINB12, FOLATE, FERRITIN, TIBC, IRON, RETICCTPCT in the last 72 hours. Urinalysis    Component Value Date/Time   COLORURINE YELLOW 07/07/2019 1944   APPEARANCEUR HAZY (A) 07/07/2019 1944   LABSPEC 1.016 07/07/2019 1944   PHURINE 6.0 07/07/2019 1944   GLUCOSEU NEGATIVE 07/07/2019 1944   HGBUR LARGE (A) 07/07/2019 1944   BILIRUBINUR NEGATIVE 07/07/2019 1944   KETONESUR NEGATIVE 07/07/2019 1944   PROTEINUR NEGATIVE 07/07/2019 1944   UROBILINOGEN 0.2 12/23/2010 1602   NITRITE NEGATIVE 07/07/2019 1944   LEUKOCYTESUR LARGE (A) 07/07/2019 1944   Sepsis Labs Invalid input(s): PROCALCITONIN,  WBC,  LACTICIDVEN Microbiology Recent Results (from the past 240 hour(s))  Urine culture     Status: Abnormal   Collection Time: 07/07/19  8:57 PM   Specimen: Urine, Random  Result Value Ref Range Status   Specimen Description URINE, RANDOM  Final   Special Requests   Final    NONE Performed at [facility], [place] [address], [place], [place] [address]    Culture >=100,000 COLONIES/mL ESCHERICHIA COLI (A)  Final   Report Status 07/09/2019 FINAL  Final   Organism ID, Bacteria ESCHERICHIA COLI (A)  Final      Susceptibility   Escherichia coli - MIC*    AMPICILLIN >=32 RESISTANT Resistant     CEFAZOLIN <=4 SENSITIVE Sensitive     CEFTRIAXONE <=0.25  SENSITIVE Sensitive     CIPROFLOXACIN >=4 RESISTANT Resistant     GENTAMICIN >=16 RESISTANT Resistant     IMIPENEM <=0.25 SENSITIVE Sensitive     NITROFURANTOIN <=16 SENSITIVE Sensitive     TRIMETH/SULFA <=20 SENSITIVE Sensitive     AMPICILLIN/SULBACTAM 4 SENSITIVE Sensitive     PIP/TAZO <=4 SENSITIVE Sensitive     * >=100,000 COLONIES/mL ESCHERICHIA COLI  SARS Coronavirus 2 by RT PCR (hospital order, performed in [place] hospital lab) Nasopharyngeal Nasopharyngeal Swab     Status: None   Collection Time: 07/07/19  8:57 PM   Specimen: Nasopharyngeal Swab  Result Value Ref Range Status   SARS Coronavirus 2 NEGATIVE NEGATIVE Final    Comment: (NOTE) SARS-CoV-2 target nucleic acids are NOT DETECTED.  The SARS-CoV-2 RNA is generally detectable in upper and lower respiratory specimens during the acute phase of infection. The lowest concentration of SARS-CoV-2 viral copies this assay can detect is 250 copies / mL. A negative result does not preclude SARS-CoV-2 infection and should not be used as the sole basis for treatment or other patient management decisions.  A negative result may occur with improper specimen collection / handling, submission of specimen other than nasopharyngeal swab, presence of viral mutation(s) within the areas targeted by this assay, and inadequate number of viral copies (<250 copies / mL). A negative result must be combined with clinical observations, patient history, and epidemiological information.  Fact Sheet for Patients:   StrictlyIdeas.no  Fact Sheet for Healthcare Providers: BankingDealers.co.za  This test is not yet approved or  cleared by the [place] FDA and has been authorized for detection and/or diagnosis of SARS-CoV-2 by FDA under  an Emergency Use Authorization (EUA).  This EUA will remain in effect (meaning this test can be used) for the duration of the COVID-19 declaration under Section  564(b)(1) of the Act, 21 U.S.C. section 360bbb-3(b)(1), unless the authorization is terminated or revoked sooner.  Performed at [facility], [place] [address] [place], [place] [address]   Gastrointestinal Panel by PCR , Stool     Status: None   Collection Time: 07/09/19 11:16 AM   Specimen: Stool  Result Value Ref Range Status   Campylobacter species NOT DETECTED NOT DETECTED Final   Plesimonas shigelloides NOT DETECTED NOT DETECTED Final   Salmonella species NOT DETECTED NOT DETECTED Final   Yersinia enterocolitica NOT DETECTED NOT DETECTED Final   Vibrio species NOT DETECTED NOT DETECTED Final   Vibrio cholerae NOT DETECTED NOT DETECTED Final   Enteroaggregative E coli (EAEC) NOT DETECTED NOT DETECTED Final   Enteropathogenic E coli (EPEC) NOT DETECTED NOT DETECTED Final   Enterotoxigenic E coli (ETEC) NOT DETECTED NOT DETECTED Final   Shiga like toxin producing E coli (STEC) NOT DETECTED NOT DETECTED Final   Shigella/Enteroinvasive E coli (EIEC) NOT DETECTED NOT DETECTED Final   Cryptosporidium NOT DETECTED NOT DETECTED Final   Cyclospora cayetanensis NOT DETECTED NOT DETECTED Final   Entamoeba histolytica NOT DETECTED NOT DETECTED Final   Giardia lamblia NOT DETECTED NOT DETECTED Final   Adenovirus F40/41 NOT DETECTED NOT DETECTED Final   Astrovirus NOT DETECTED NOT DETECTED Final   Norovirus GI/GII NOT DETECTED NOT DETECTED Final   Rotavirus A NOT DETECTED NOT DETECTED Final   Sapovirus (I, II, IV, and V) NOT DETECTED NOT DETECTED Final    Comment: Performed at [facility], [address] [place], [place] [address]     Time coordinating discharge: Over 40 minutes  SIGNED:   Charlynne Cousins, MD  [facility] 07/14/2019, 8:20 AM Pager   If 7PM-7AM, please contact night-coverage [email or web address] Password [email or web address]

## 2019-07-14 NOTE — Plan of Care (Signed)
  Problem: Education: Goal: Knowledge of General Education information will improve Description: Including pain rating scale, medication(s)/side effects and non-pharmacologic comfort measures 07/14/2019 1220 by Melina Schools, RN Outcome: Adequate for Discharge 07/14/2019 0817 by Melina Schools, RN Outcome: Progressing   Problem: Health Behavior/Discharge Planning: Goal: Ability to manage health-related needs will improve 07/14/2019 1220 by Melina Schools, RN Outcome: Adequate for Discharge 07/14/2019 0817 by Melina Schools, RN Outcome: Not Progressing   Problem: Clinical Measurements: Goal: Ability to maintain clinical measurements within normal limits will improve Outcome: Adequate for Discharge Goal: Will remain free from infection Outcome: Adequate for Discharge Goal: Diagnostic test results will improve Outcome: Adequate for Discharge Goal: Respiratory complications will improve Outcome: Adequate for Discharge Goal: Cardiovascular complication will be avoided Outcome: Adequate for Discharge   Problem: Activity: Goal: Risk for activity intolerance will decrease 07/14/2019 1220 by Melina Schools, RN Outcome: Adequate for Discharge 07/14/2019 [phone] by Melina Schools, RN Outcome: Adequate for Discharge   Problem: Nutrition: Goal: Adequate nutrition will be maintained Outcome: Adequate for Discharge   Problem: Coping: Goal: Level of anxiety will decrease 07/14/2019 1220 by Melina Schools, RN Outcome: Adequate for Discharge 07/14/2019 0817 by Melina Schools, RN Outcome: Not Progressing   Problem: Elimination: Goal: Will not experience complications related to bowel motility Outcome: Adequate for Discharge Goal: Will not experience complications related to urinary retention Outcome: Adequate for Discharge   Problem: Pain Managment: Goal: General experience of comfort will improve 07/14/2019 1220 by Melina Schools, RN Outcome: Adequate for  Discharge 07/14/2019 0817 by Melina Schools, RN Outcome: Adequate for Discharge   Problem: Safety: Goal: Ability to remain free from injury will improve 07/14/2019 1220 by Melina Schools, RN Outcome: Adequate for Discharge 07/14/2019 0817 by Melina Schools, RN Outcome: Progressing   Problem: Skin Integrity: Goal: Risk for impaired skin integrity will decrease Outcome: Adequate for Discharge

## 2019-07-14 NOTE — Progress Notes (Signed)
Discharge instructions given and understanding verbalized. Saline lock removed. Patient discharged See SW note

## 2019-07-14 NOTE — TOC Progression Note (Signed)
Transition of Care [facility]) - Progression Note    Patient Details  Name: [name] MRN: [id number] Date of Birth: 05-28-1950  [facility]) CM/SW [place], [place] Phone Number: 07/14/2019, 11:12 AM  Clinical Narrative:     CSW was consulted to assist patient with housing. CSW met with patient to discuss housing. Patient informed CSW she actually does not have a place to go when she discharges from the hospital. Patient expressed she has to leave the home she was at and find somewhere else to live. Patient stated she receives SSI in the amount of over $1500 on the third of each month and currently has a couple hundred dollars. Patient expressed she refuses to go to a homeless shelter. CSW agreed to contact hotels for patient, that she will be able to afford.  CSW contacted multiple hotels until one was located that does not require a credit card to book. CSW provided patient with the information for [facility] in [place]. CSW also contacted [employer] to inquire on patient getting assistance with locating housing within her budget. CSW was told to have patient contact the agency to provide information so housing assistance can be provided. CSW provided patient with resources for food, laundry, etc. CSW provided patient information on the [facility], where they also can provided mental health support, such as groups if needed. Patient expressed she drives and has her vehicle in the parking lot. She expressed no additional needs at this time.  [facility] Barriers to Discharge: Other (comment) (Awaitng inpatient psych bed)  Expected Discharge Plan and Services Expected Discharge Plan: [facility]         Expected Discharge Date: 07/14/19                                     Social Determinants of Health (SDOH) Interventions    Readmission Risk Interventions No flowsheet data found.

## 2019-07-14 NOTE — Plan of Care (Signed)
  Problem: Education: Goal: Knowledge of General Education information will improve Description: Including pain rating scale, medication(s)/side effects and non-pharmacologic comfort measures Outcome: Progressing   Problem: Safety: Goal: Ability to remain free from injury will improve Outcome: Progressing   

## 2019-08-22 ENCOUNTER — Telehealth: Payer: Self-pay | Admitting: *Deleted

## 2019-08-22 NOTE — Telephone Encounter (Signed)
Left message telling pt to stabilize the toenail with a dry bandaid, wear an open-toed shoe and take OTC what she would take for a headache it would help the pain, and to rest and elevate.

## 2019-08-22 NOTE — Telephone Encounter (Signed)
Pt states her toenail is coming away from the nailbed and is painful, and she would like to know what to do until seen on Tuesday.

## 2019-08-26 ENCOUNTER — Other Ambulatory Visit: Payer: Self-pay

## 2019-08-26 ENCOUNTER — Encounter: Payer: Self-pay | Admitting: Podiatry

## 2019-08-26 ENCOUNTER — Ambulatory Visit (INDEPENDENT_AMBULATORY_CARE_PROVIDER_SITE_OTHER): Payer: Medicare HMO

## 2019-08-26 ENCOUNTER — Ambulatory Visit: Payer: Medicare HMO | Admitting: Podiatry

## 2019-08-26 VITALS — BP 155/85 | HR 69 | Temp 97.8°F | Resp 16

## 2019-08-26 DIAGNOSIS — L089 Local infection of the skin and subcutaneous tissue, unspecified: Secondary | ICD-10-CM

## 2019-08-26 DIAGNOSIS — L4050 Arthropathic psoriasis, unspecified: Secondary | ICD-10-CM | POA: Diagnosis not present

## 2019-08-26 DIAGNOSIS — L139 Bullous disorder, unspecified: Secondary | ICD-10-CM | POA: Diagnosis not present

## 2019-08-27 ENCOUNTER — Telehealth: Payer: Self-pay | Admitting: Podiatry

## 2019-08-27 LAB — HLA-B27 ANTIGEN: HLA-B27 Antigen: NEGATIVE

## 2019-08-27 LAB — CBC WITH DIFFERENTIAL/PLATELET
Absolute Monocytes: 707 cells/uL (ref 200–950)
Basophils Absolute: 81 cells/uL (ref 0–200)
Basophils Relative: 0.8 %
Eosinophils Absolute: 253 cells/uL (ref 15–500)
Eosinophils Relative: 2.5 %
HCT: 38.9 % (ref 35.0–45.0)
Hemoglobin: 13.1 g/dL (ref 11.7–15.5)
Lymphs Abs: 3151 cells/uL (ref 850–3900)
MCH: 29.6 pg (ref 27.0–33.0)
MCHC: 33.7 g/dL (ref 32.0–36.0)
MCV: 88 fL (ref 80.0–100.0)
MPV: 10.7 fL (ref 7.5–12.5)
Monocytes Relative: 7 %
Neutro Abs: 5909 cells/uL (ref 1500–7800)
Neutrophils Relative %: 58.5 %
Platelets: 335 10*3/uL (ref 140–400)
RBC: 4.42 10*6/uL (ref 3.80–5.10)
RDW: 13.6 % (ref 11.0–15.0)
Total Lymphocyte: 31.2 %
WBC: 10.1 10*3/uL (ref 3.8–10.8)

## 2019-08-27 LAB — RHEUMATOID FACTOR: Rheumatoid fact SerPl-aCnc: <14 IU/mL (ref <14)

## 2019-08-27 LAB — SEDIMENTATION RATE: Sed Rate: 2 mm/h (ref 0–30)

## 2019-08-27 LAB — C-REACTIVE PROTEIN: CRP: 2.3 mg/L (ref <8.0)

## 2019-08-27 MED ORDER — CEPHALEXIN 500 MG PO CAPS: 500.0000 mg | ORAL_CAPSULE | Freq: Three times a day (TID) | ORAL | 0 refills | Status: DC

## 2019-08-27 NOTE — Progress Notes (Signed)
  Subjective:  Patient ID: [name], female    DOB: 1950/10/22,  MRN: [id number]  Chief Complaint  Patient presents with  . Nail Problem    Bilateral; Left foot; Hallux-base of nail; 3rd toe-base of nail; Right foot; 2nd toe-base of nail; pt stated, "I has had no injuries; gets sharp pains in toes and feet at night time; has no idea how this happened"; x1 week    69 y.o. female presents with the above complaint. History confirmed with patient.  The toes began turning red and blistering at the proximal border of the nail and seem to be lifting off the nail.  Clear serous drainage from the blisters  Objective:  Physical Exam: warm, good capillary refill, no trophic changes or ulcerative lesions, normal DP and PT pulses and normal sensory exam. Left Foot: Large serous blister at the eponychium of the nail plate of the hallux, smaller one at the eponychial him of the nail plate of the third toe Right Foot: Eponychium of the right second digit is erythematous and tender without blister formation  X-ray of both feet: No acute osseous abnormalities to explain the patient's symptoms Assessment:   1. Psoriatic arthritis ([place])   2. Blistering distal dactylitis      Plan:  Patient was evaluated and treated and all questions answered.  -Following sterile prep with Betadine the serous blisters and bullae were aspirated and lanced with an 18-gauge needle.  A small tissue sample of the blister from the hallux blister was sent to dermatopathology for analysis.  Advised her to keep covered with triple antibiotic ointment and a Band-Aid  -My chief concern is that this could be dactylitis or a form of psoriasis, although she has no other skin manifestations currently, she did say that she has some bumps and blisters that develop on her back occasionally.  I have ordered lab work including a CBC, CRP, ESR, HLA-B27, rheumatoid factor, anti-CCP, ANA.   Return in about 3 weeks (around 09/16/2019).

## 2019-08-27 NOTE — Telephone Encounter (Signed)
Rx sent for keflex, reviewed lab work with her as well that PA workup is negative so far.

## 2019-08-27 NOTE — Telephone Encounter (Signed)
Pt was seen in office yesterday and was under the impression that the doctor was going to call in a prescription. Pt checked with her pharmacy and they did not receive anything from our office.   Pt calling to follow up.   Please send to [facility] on [address]

## 2019-08-27 NOTE — Addendum Note (Signed)
Addended bySherryle Lis, Jacobo Moncrief R on: 08/27/2019 05:43 PM   Modules accepted: Orders

## 2019-09-11 ENCOUNTER — Ambulatory Visit: Payer: Medicare HMO | Admitting: Podiatry

## 2019-09-11 ENCOUNTER — Other Ambulatory Visit: Payer: Self-pay

## 2019-09-11 DIAGNOSIS — L03032 Cellulitis of left toe: Secondary | ICD-10-CM | POA: Diagnosis not present

## 2019-09-11 DIAGNOSIS — L089 Local infection of the skin and subcutaneous tissue, unspecified: Secondary | ICD-10-CM | POA: Diagnosis not present

## 2019-09-11 DIAGNOSIS — L03031 Cellulitis of right toe: Secondary | ICD-10-CM

## 2019-09-11 MED ORDER — LIDOCAINE-PRILOCAINE 2.5-2.5 % EX CREA: 1.0000 "application " | TOPICAL_CREAM | CUTANEOUS | 2 refills | Status: DC | PRN

## 2019-09-11 NOTE — Progress Notes (Signed)
Subjective:  Patient ID: [name], female    DOB: [birth year],  MRN: [id number]  Chief Complaint  Patient presents with  . Nail Problem    2 week follow up left great toenail    69 y.o. female presents with the above complaint. History confirmed with patient.  The toes are improving in redness, she has almost finished all the keflex. Notes pain in the toes, especially at night and especially on the left hallux.   Objective:  Physical Exam: warm, good capillary refill, no trophic changes or ulcerative lesions, normal DP and PT pulses and normal sensory exam. Blisters on left hallux and third toe, right second, have resolved, the proximal nail plate does appear to be lifted, but nails remain firmly attached, mild erythema of pulps.  HLA-B27 Antigen Order: [id number] Status:  Final result  Visible to patient:  No (inaccessible in MyChart)  Next appt:  11/13/2019 at 09:15 AM in Podiatry (Enya Bureau R Arla Boutwell, DPM)  Dx:  Psoriatic arthritis [facility])  0 Result Notes  Ref Range & Units 2 wk ago  HLA-B27 Antigen NEGATIVE NEGATIVE        Rheumatoid factor Order: [id number] Status:  Final result  Visible to patient:  No (inaccessible in [place])  Next appt:  11/13/2019 at 09:15 AM in Podiatry (Jahnay Lantier R Anslee Micheletti, DPM)  Dx:  Psoriatic arthritis [facility])  0 Result Notes  Ref Range & Units 2 wk ago  Rhuematoid fact SerPl-aCnc <14 IU/mL <14        C-reactive protein Order: [id number] Status:  Final result  Visible to patient:  No (inaccessible in MyChart)  Next appt:  11/13/2019 at 09:15 AM in Podiatry (Britian Jentz R Alden Feagan, DPM)  Dx:  Psoriatic arthritis ([place])  0 Result Notes  Ref Range & Units 2 wk ago  CRP <8.0 mg/L 2.3         0 Result Notes  Ref Range & Units 2 wk ago  Sed Rate 0 - 30 mm/h 2        CBC with Differential Order: [id number] Status:  Final result  Visible to patient:  No (inaccessible in MyChart)  Next appt:  11/13/2019 at 09:15 AM in Podiatry Criselda Peaches, DPM)  Dx:   Psoriatic arthritis ([place])  0 Result Notes  Ref Range & Units 2 wk ago  WBC 3.8 - 10.8 Thousand/uL 10.1   RBC 3.80 - 5.10 Million/uL 4.42   Hemoglobin 11.7 - 15.5 g/dL 13.1   HCT 35 - 45 % 38.9   MCV 80.0 - 100.0 fL 88.0   MCH 27.0 - 33.0 pg 29.6   MCHC 32.0 - 36.0 g/dL 33.7   RDW 11.0 - 15.0 % 13.6   Platelets 140 - 400 Thousand/uL 335   MPV 7.5 - 12.5 fL 10.7   Neutro Abs 1,500 - 7,800 cells/uL 5,909   Lymphs Abs 850 - 3,900 cells/uL 3,151   Absolute Monocytes 200 - 950 cells/uL 707   Eosinophils Absolute 15 - 500 cells/uL 253   Basophils Absolute 0 - 200 cells/uL 81   Neutrophils Relative % % 58.5   Total Lymphocyte % 31.2   Monocytes Relative % 7.0   Eosinophils Relative % 2.5   Basophils Relative % 0.8       Pathology results have not finalized  Assessment:   1. Blistering distal dactylitis   2. Infection of nail bed of toe of left foot   3. Infection of nail bed of toe of right foot  Plan:  Patient was evaluated and treated and all questions answered.  -Today her skin and toenails have improved quite a bit.  The toenails have loosened somewhat and advised her that they may come off at some point or may require total nail avulsion -Still unclear on the etiology of these, her psoriatic work-up was negative.  Pathology results are still pending.  Currently her working diagnosis is that this is a soft tissue infection of the proximal nail fold and has resolved. -She does have residual pain and irritation of toes, this most commonly bothers her at night.  She does have a history of lumbosacral arthritis, but no evidence of radiculopathy or sciatica.  I sent her a prescription for lidocaine prilocaine cream to see if this helps at night. -We will reevaluate this in 2 months  Return in about 2 months (around 11/11/2019).

## 2019-10-31 DIAGNOSIS — R202 Paresthesia of skin: Secondary | ICD-10-CM | POA: Diagnosis not present

## 2019-11-13 ENCOUNTER — Ambulatory Visit: Payer: Medicare HMO | Admitting: Podiatry

## 2019-11-18 DIAGNOSIS — M545 Low back pain, unspecified: Secondary | ICD-10-CM | POA: Diagnosis not present

## 2019-11-18 DIAGNOSIS — R079 Chest pain, unspecified: Secondary | ICD-10-CM | POA: Diagnosis not present

## 2019-11-21 ENCOUNTER — Telehealth: Payer: Self-pay | Admitting: *Deleted

## 2019-11-21 NOTE — Telephone Encounter (Signed)
NOTES ON FILE FROM [facility]. [phone]. REFERRAL SENT TO SCHEDULING.

## 2019-12-02 ENCOUNTER — Ambulatory Visit: Payer: Medicare HMO | Admitting: Podiatry

## 2019-12-03 ENCOUNTER — Other Ambulatory Visit: Payer: Self-pay

## 2019-12-03 ENCOUNTER — Ambulatory Visit (INDEPENDENT_AMBULATORY_CARE_PROVIDER_SITE_OTHER): Payer: Medicare HMO | Admitting: Cardiology

## 2019-12-03 ENCOUNTER — Encounter: Payer: Self-pay | Admitting: Cardiology

## 2019-12-03 VITALS — BP 132/70 | HR 69 | Ht 66.0 in | Wt 172.0 lb

## 2019-12-03 DIAGNOSIS — I10 Essential (primary) hypertension: Secondary | ICD-10-CM

## 2019-12-03 DIAGNOSIS — R072 Precordial pain: Secondary | ICD-10-CM | POA: Diagnosis not present

## 2019-12-03 DIAGNOSIS — R0600 Dyspnea, unspecified: Secondary | ICD-10-CM | POA: Diagnosis not present

## 2019-12-03 DIAGNOSIS — R0609 Other forms of dyspnea: Secondary | ICD-10-CM

## 2019-12-03 NOTE — Patient Instructions (Signed)
Medication Instructions:  Your physician recommends that you continue on your current medications as directed. Please refer to the Current Medication list given to you today.  Labwork: None ordered.  Testing/Procedures: Your physician has requested that you have en exercise stress myoview. Please follow instruction sheet, as given.  Please schedule for exercise myoview stress test  Your physician has requested that you have an echocardiogram. Echocardiography is a painless test that uses sound waves to create images of your heart. It provides your doctor with information about the size and shape of your heart and how well your heart's chambers and valves are working. This procedure takes approximately one hour. There are no restrictions for this procedure.  Please schedule for echo   Follow-Up: Your physician wants you to follow-up in: 6-8 weeks with Dr. Quentin Ore.     Any Other Special Instructions Will Be Listed Below (If Applicable).  If you need a refill on your cardiac medications before your next appointment, please call your pharmacy.

## 2019-12-03 NOTE — Progress Notes (Signed)
Electrophysiology Office Note:    Date:  12/03/2019   ID:  [name], DOB [birth year], MRN [id number]  PCP:  [name] Amel, MD  [place] Cardiologist:  No primary care provider on file.  [facility] Electrophysiologist:  [name] Epley, MD   Referring MD: [name] Amel, MD   Chief Complaint: Chest pain  History of Present Illness:    [name] is a 69 y.o. female who presents for an evaluation of chest pain at the request of Dr Dorthy Cooler. Their medical history includes hypertension, hyperlipidemia, fibromyalgia, anxiety, brain aneurysm.  Their last seen by the primary physician on November 18, 2019.  At that appointment she reported chest pain.  EKG was performed on November 18, 2019 showing sinus rhythm.  There is T wave flattening in the lateral precordium.  Today she tells me that the last 1-1/2 to 2 months she is experienced dyspnea with exertion which is a new symptom for her.  She does not describe chest pain to me with exertion.  At times she reports an abnormal sensation in her right arm that comes and goes and is more frequent than the dyspnea episodes.  There is no direct relationship between the abnormal sensation in her right arm and exertion.  No syncope or presyncope.  No swelling.  Past Medical History:  Diagnosis Date  . Aneurysm ([place])    brain  . Anxiety   . Arthritis   . Depression   . Fibromyalgia   . GERD (gastroesophageal reflux disease)   . Hyperlipidemia    pt. thought she was placed on medication for cholesterol but does not know for sure.  . Hypertension   . Insomnia   . Seizures ([place])    [id number]    Past Surgical History:  Procedure Laterality Date  . ABDOMINAL HYSTERECTOMY    . BREAST CYST EXCISION Right   . CEREBRAL ANEURYSM REPAIR    . CEREBRAL ANEURYSM REPAIR     clipped   . [place]OS[place]     12+ years ago [place]  . POLYPECTOMY      Current Medications: Current Meds  Medication Sig  . amLODipine (NORVASC) 5 MG tablet Take 5 mg  by mouth daily.  . baclofen (LIORESAL) 10 MG tablet Take 10 mg by mouth at bedtime.   . cloNIDine (CATAPRES) 0.1 MG tablet TAKE 1 TABLET(0.1 MG) BY MOUTH TWICE DAILY  . fenofibrate 54 MG tablet Take 54 mg by mouth daily.   . hydrochlorothiazide (HYDRODIURIL) 25 MG tablet Take 25 mg by mouth daily.  Marland Kitchen levETIRAcetam (KEPPRA) 500 MG tablet Take 1 tablet (500 mg total) by mouth 2 (two) times daily.  Marland Kitchen losartan (COZAAR) 100 MG tablet Take 100 mg by mouth daily.  . metoprolol tartrate (LOPRESSOR) 50 MG tablet Take 50 mg by mouth 2 (two) times daily.   Marland Kitchen omeprazole (PRILOSEC) 20 MG capsule Take 20 mg by mouth at bedtime.   . traZODone (DESYREL) 100 MG tablet Take 1 tablet (100 mg total) by mouth at bedtime.   Current Facility-Administered Medications for the 12/03/19 encounter (Office Visit) with [name] Epley, MD  Medication  . 0.9 %  sodium chloride infusion     Allergies:   Barium-containing compounds, Eggs or egg-derived products, and Red dye   Social History   Socioeconomic History  . Marital status: Divorced    Spouse name: Not on file  . Number of children: Not on file  . Years of education: Not on file  . Highest education level:  Not on file  Occupational History  . Not on file  Tobacco Use  . Smoking status: Former Smoker    Quit date: 1985    Years since quitting: 36.8  . Smokeless tobacco: Never Used  Substance and Sexual Activity  . Alcohol use: No  . Drug use: No  . Sexual activity: Not Currently  Other Topics Concern  . Not on file  Social History Narrative  . Not on file   Social Determinants of Health   Financial Resource Strain:   . Difficulty of Paying Living Expenses: Not on file  Food Insecurity:   . Worried About Charity fundraiser in the Last Year: Not on file  . Ran Out of Food in the Last Year: Not on file  Transportation Needs:   . Lack of Transportation (Medical): Not on file  . Lack of Transportation (Non-Medical): Not on file  Physical  Activity:   . Days of Exercise per Week: Not on file  . Minutes of Exercise per Session: Not on file  Stress:   . Feeling of Stress : Not on file  Social Connections:   . Frequency of Communication with Friends and Family: Not on file  . Frequency of Social Gatherings with Friends and Family: Not on file  . Attends Religious Services: Not on file  . Active Member of Clubs or Organizations: Not on file  . Attends Archivist Meetings: Not on file  . Marital Status: Not on file     Family History: The patient's family history includes Arthritis in her brother; Cancer in her brother; Colon cancer in her paternal grandfather; Esophageal cancer in her son; Heart disease in her father and mother. There is no history of Colon polyps, Rectal cancer, or Stomach cancer.  ROS:   Please see the history of present illness.    All other systems reviewed and are negative.  EKGs/Labs/Other Studies Reviewed:    The following studies were reviewed today: Outside hospital records, prior stress test  March 01, 2016 exercise tolerance test was negative for ischemia.  She achieved 7 METS during the study.  EKG:  The ekg ordered today demonstrates sinus rhythm  Recent Labs: 07/08/2019: ALT 21; BUN 15; Creatinine, Ser 0.88; Potassium 3.7; Sodium 135; TSH 1.418 07/12/2019: Magnesium 1.8 08/26/2019: Hemoglobin 13.1; Platelets 335  Recent Lipid Panel    Component Value Date/Time   CHOL 217 (H) 06/07/2010 0920   TRIG 305 (H) 06/07/2010 0920   HDL 59 06/07/2010 0920   CHOLHDL 3.7 06/07/2010 0920   VLDL 61 (H) 06/07/2010 0920   LDLCALC 97 06/07/2010 0920    Physical Exam:    VS:  BP 132/70   Pulse 69   Ht 5\' 6"  (1.676 m)   Wt 172 lb (78 kg)   SpO2 96%   BMI 27.76 kg/m     Wt Readings from Last 3 Encounters:  12/03/19 172 lb (78 kg)  07/07/19 175 lb (79.4 kg)  03/19/18 175 lb (79.4 kg)     GEN:  Well nourished, well developed in no acute distress HEENT: Normal NECK: No JVD; No  carotid bruits LYMPHATICS: No lymphadenopathy CARDIAC: RRR, no murmurs, rubs, gallops RESPIRATORY:  Clear to auscultation without rales, wheezing or rhonchi  ABDOMEN: Soft, non-tender, non-distended MUSCULOSKELETAL:  No edema; No deformity  SKIN: Warm and dry NEUROLOGIC:  Alert and oriented x 3 PSYCHIATRIC:  Normal affect   ASSESSMENT:    1. Dyspnea on exertion   2. Primary hypertension  3. Precordial pain    PLAN:    In order of problems listed above:  1. Dyspnea on exertion Concerning that this may represent an anginal equivalent.  Given her risk factors, we will plan to do an exercise nuclear stress.  We will also check an echocardiogram to assess for structural heart disease.  Plan to see her back in clinic in 6 to 8 weeks to discuss results.  2.  Hypertension Controlled.  Continue amlodipine, clonidine, hydrochlorothiazide, losartan.   Medication Adjustments/Labs and Tests Ordered: Current medicines are reviewed at length with the patient today.  Concerns regarding medicines are outlined above.  Orders Placed This Encounter  Procedures  . Myocardial Perfusion Imaging  . EKG 12-Lead  . ECHOCARDIOGRAM COMPLETE   No orders of the defined types were placed in this encounter.    Signed, Lars Mage, MD, [facility]  12/03/2019 1:46 PM    Electrophysiology [facility]

## 2019-12-10 ENCOUNTER — Telehealth (HOSPITAL_COMMUNITY): Payer: Self-pay | Admitting: *Deleted

## 2019-12-10 NOTE — Telephone Encounter (Signed)
Left message on voicemail in reference to upcoming appointment scheduled for 12/17/19. Phone number given for a call back so details instructions can be given.  Kirstie Peri

## 2019-12-13 ENCOUNTER — Other Ambulatory Visit (HOSPITAL_COMMUNITY)
Admission: RE | Admit: 2019-12-13 | Discharge: 2019-12-13 | Disposition: A | Discharge status: Home / Self Care | Payer: Medicare HMO | Source: Ambulatory Visit | Attending: Cardiology | Admitting: Cardiology

## 2019-12-13 DIAGNOSIS — Z20822 Contact with and (suspected) exposure to covid-19: Secondary | ICD-10-CM | POA: Diagnosis not present

## 2019-12-13 DIAGNOSIS — Z01812 Encounter for preprocedural laboratory examination: Secondary | ICD-10-CM | POA: Diagnosis not present

## 2019-12-13 LAB — SARS CORONAVIRUS 2 (TAT 6-24 HRS): SARS Coronavirus 2: NEGATIVE

## 2019-12-17 ENCOUNTER — Ambulatory Visit (HOSPITAL_COMMUNITY): Payer: Medicare HMO | Attending: Cardiology

## 2019-12-17 ENCOUNTER — Other Ambulatory Visit: Payer: Self-pay

## 2019-12-17 DIAGNOSIS — R072 Precordial pain: Secondary | ICD-10-CM

## 2019-12-17 LAB — MYOCARDIAL PERFUSION IMAGING
Estimated workload: 6.4 METS
Exercise duration (min): 4 min
Exercise duration (sec): 30 s
LV dias vol: 36 mL (ref 46–106)
LV sys vol: 6 mL
MPHR: 149 {beats}/min
Peak HR: 134 {beats}/min
Percent HR: 89 %
Rest HR: 74 {beats}/min
SDS: 0
SRS: 0
SSS: 0
TID: 0.62

## 2019-12-17 MED ORDER — REGADENOSON 0.4 MG/5ML IV SOLN: 0.4000 mg | Freq: Once | INTRAVENOUS | Status: DC

## 2019-12-17 MED ORDER — TECHNETIUM TC 99M TETROFOSMIN IV KIT
10.6000 | PACK | Freq: Once | INTRAVENOUS | Status: AC | PRN
  Administered 2019-12-17: 10.6 via INTRAVENOUS
  Filled 2019-12-17: qty 11

## 2019-12-17 MED ORDER — TECHNETIUM TC 99M TETROFOSMIN IV KIT
32.2000 | PACK | Freq: Once | INTRAVENOUS | Status: AC | PRN
  Administered 2019-12-17: 32.2 via INTRAVENOUS
  Filled 2019-12-17: qty 33

## 2019-12-25 ENCOUNTER — Ambulatory Visit (HOSPITAL_COMMUNITY): Payer: Medicare HMO | Attending: Cardiovascular Disease

## 2019-12-25 ENCOUNTER — Other Ambulatory Visit: Payer: Self-pay

## 2019-12-25 DIAGNOSIS — R072 Precordial pain: Secondary | ICD-10-CM | POA: Diagnosis not present

## 2019-12-25 DIAGNOSIS — I10 Essential (primary) hypertension: Secondary | ICD-10-CM | POA: Insufficient documentation

## 2019-12-25 DIAGNOSIS — R0600 Dyspnea, unspecified: Secondary | ICD-10-CM | POA: Diagnosis not present

## 2019-12-25 DIAGNOSIS — R0609 Other forms of dyspnea: Secondary | ICD-10-CM

## 2019-12-25 LAB — ECHOCARDIOGRAM COMPLETE
Area-P 1/2: 2.76 cm2
S' Lateral: 2.5 cm

## 2020-01-14 ENCOUNTER — Ambulatory Visit (INDEPENDENT_AMBULATORY_CARE_PROVIDER_SITE_OTHER): Payer: Medicare HMO | Admitting: Cardiology

## 2020-01-14 ENCOUNTER — Other Ambulatory Visit: Payer: Self-pay

## 2020-01-14 VITALS — BP 130/68 | HR 61 | Ht 66.0 in | Wt 169.0 lb

## 2020-01-14 DIAGNOSIS — I10 Essential (primary) hypertension: Secondary | ICD-10-CM | POA: Diagnosis not present

## 2020-01-14 DIAGNOSIS — R0600 Dyspnea, unspecified: Secondary | ICD-10-CM

## 2020-01-14 DIAGNOSIS — R0609 Other forms of dyspnea: Secondary | ICD-10-CM

## 2020-01-14 NOTE — Patient Instructions (Addendum)
Medication Instructions:  Your physician recommends that you continue on your current medications as directed. Please refer to the Current Medication list given to you today.  Labwork: None ordered.  Testing/Procedures: None ordered.  Follow-Up: Your physician wants you to follow-up in: as needed with Dr. Lambert.    Any Other Special Instructions Will Be Listed Below (If Applicable).  If you need a refill on your cardiac medications before your next appointment, please call your pharmacy.   

## 2020-01-14 NOTE — Progress Notes (Signed)
Electrophysiology Office Follow up Visit Note:    Date:  01/14/2020   ID:  [name], DOB [birth year], MRN [id number]  PCP:  Lujean Amel, MD  [place] Cardiologist:  No primary care provider on file.  [facility] Electrophysiologist:  Vickie Epley, MD    [place] History:    [name] is a 69 y.o. female who presents for a follow up visit. They were last seen in clinic on December 03, 2019 for shortness of breath with exertion.  Since then she has had a stress test and echocardiogram.  Both of these test did not show any significant abnormalities.  On the stress test, she was noted to have a poor exercise tolerance.  She does tell me that she previously smoked and has had frequent bouts of bronchitis in the past.  She has a Therapist, music.     Past Medical History:  Diagnosis Date  . Aneurysm ([place])    brain  . Anxiety   . Arthritis   . Depression   . Fibromyalgia   . GERD (gastroesophageal reflux disease)   . Hyperlipidemia    pt. thought she was placed on medication for cholesterol but does not know for sure.  . Hypertension   . Insomnia   . Seizures ([place])    [id number]    Past Surgical History:  Procedure Laterality Date  . ABDOMINAL HYSTERECTOMY    . BREAST CYST EXCISION Right   . CEREBRAL ANEURYSM REPAIR    . CEREBRAL ANEURYSM REPAIR     clipped   . [place]     [place]+ [place] ago [place]  . POLYPECTOMY      Current Medications: Current Meds  Medication Sig  . amLODipine (NORVASC) 5 MG tablet Take 5 mg by mouth daily.  . baclofen (LIORESAL) 10 MG tablet Take 10 mg by mouth at bedtime.  . cloNIDine (CATAPRES) 0.1 MG tablet TAKE 1 TABLET(0.1 MG) BY MOUTH TWICE DAILY  . fenofibrate 54 MG tablet Take 54 mg by mouth daily.   . hydrochlorothiazide (HYDRODIURIL) 25 MG tablet Take 25 mg by mouth daily.  Marland Kitchen levETIRAcetam (KEPPRA) 500 MG tablet Take 1 tablet (500 mg total) by mouth 2 (two) times daily.  Marland Kitchen losartan (COZAAR) 100 MG tablet Take 100 mg  by mouth daily.  . metoprolol tartrate (LOPRESSOR) 50 MG tablet Take 50 mg by mouth 2 (two) times daily.   Marland Kitchen omeprazole (PRILOSEC) 20 MG capsule Take 20 mg by mouth at bedtime.   . traZODone (DESYREL) 100 MG tablet Take 1 tablet (100 mg total) by mouth at bedtime.   Current Facility-Administered Medications for the 01/14/20 encounter (Office Visit) with Vickie Epley, MD  Medication  . 0.9 %  sodium chloride infusion     Allergies:   Barium-containing compounds, Eggs or egg-derived products, and Red dye   Social History   Socioeconomic History  . Marital status: Divorced    Spouse name: Not on file  . Number of children: Not on file  . Years of education: Not on file  . Highest education level: Not on file  Occupational History  . Not on file  Tobacco Use  . Smoking status: Former Smoker    Quit date: 1985    Years since quitting: 36.9  . Smokeless tobacco: Never Used  Substance and Sexual Activity  . Alcohol use: No  . Drug use: No  . Sexual activity: Not Currently  Other Topics Concern  . Not on file  Social History Narrative  .  Not on file   Social Determinants of Health   Financial Resource Strain: Not on file  Food Insecurity: Not on file  Transportation Needs: Not on file  Physical Activity: Not on file  Stress: Not on file  Social Connections: Not on file     Family History: The patient's family history includes Arthritis in her brother; Cancer in her brother; Colon cancer in her paternal grandfather; Esophageal cancer in her son; Heart disease in her father and mother. There is no history of Colon polyps, Rectal cancer, or Stomach cancer.  ROS:   Please see the history of present illness.    All other systems reviewed and are negative.  EKGs/Labs/Other Studies Reviewed:    The following studies were reviewed today: Stress test, echo  December 25, 2019 echo personally reviewed Left ventricular function normal, 60% Right ventricular function  normal No significant valvular abnormalities  December 17, 2019 nuclear stress  No evidence of heart artery narrowings or blockages.  Normal EF.  Poor exercise tolerance.   Recent Labs: 07/08/2019: ALT 21; BUN 15; Creatinine, Ser 0.88; Potassium 3.7; Sodium 135; TSH 1.418 07/12/2019: Magnesium 1.8 08/26/2019: Hemoglobin 13.1; Platelets 335  Recent Lipid Panel    Component Value Date/Time   CHOL 217 (H) 06/07/2010 0920   TRIG 305 (H) 06/07/2010 0920   HDL 59 06/07/2010 0920   CHOLHDL 3.7 06/07/2010 0920   VLDL 61 (H) 06/07/2010 0920   LDLCALC 97 06/07/2010 0920    Physical Exam:    VS:  BP 130/68   Pulse 61   Ht 5\' 6"  (1.676 m)   Wt 169 lb (76.7 kg)   SpO2 98%   BMI 27.28 kg/m     Wt Readings from Last 3 Encounters:  01/14/20 169 lb (76.7 kg)  12/17/19 172 lb (78 kg)  12/03/19 172 lb (78 kg)     GEN:  Well nourished, well developed in no acute distress HEENT: Normal NECK: No JVD; No carotid bruits LYMPHATICS: No lymphadenopathy CARDIAC: RRR, no murmurs, rubs, gallops RESPIRATORY:  Clear to auscultation without rales, wheezing or rhonchi  ABDOMEN: Soft, non-tender, non-distended MUSCULOSKELETAL:  No edema; No deformity  SKIN: Warm and dry NEUROLOGIC:  Alert and oriented x 3 PSYCHIATRIC:  Normal affect   ASSESSMENT:    1. Primary hypertension    PLAN:    In order of problems listed above:  1. Dyspnea on exertion No clear cardiac cause for her dyspnea.  It could be an element of deconditioning.  There is also a chance that she has primary lung disease given her history of smoking.  I have asked her to touch base with her general practitioner to consider further study of her lungs with chest x-ray +/- PFTs. We also spent a great deal of time during today's visit talking about increasing her aerobic exercise.  2.  Hypertension Controlled Continue clonidine, hydrochlorothiazide, losartan, metoprolol, amlodipine   Medication Adjustments/Labs and Tests  Ordered: Current medicines are reviewed at length with the patient today.  Concerns regarding medicines are outlined above.  No orders of the defined types were placed in this encounter.  No orders of the defined types were placed in this encounter.    Signed, Lars Mage, MD, [facility]  01/14/2020 3:02 PM    Electrophysiology [facility]

## 2020-04-22 DIAGNOSIS — R300 Dysuria: Secondary | ICD-10-CM | POA: Diagnosis not present

## 2020-07-20 DIAGNOSIS — E7800 Pure hypercholesterolemia, unspecified: Secondary | ICD-10-CM | POA: Diagnosis not present

## 2020-07-20 DIAGNOSIS — R69 Illness, unspecified: Secondary | ICD-10-CM | POA: Diagnosis not present

## 2020-07-20 DIAGNOSIS — R7309 Other abnormal glucose: Secondary | ICD-10-CM | POA: Diagnosis not present

## 2020-07-20 DIAGNOSIS — R0989 Other specified symptoms and signs involving the circulatory and respiratory systems: Secondary | ICD-10-CM | POA: Diagnosis not present

## 2020-07-20 DIAGNOSIS — S90222A Contusion of left lesser toe(s) with damage to nail, initial encounter: Secondary | ICD-10-CM | POA: Diagnosis not present

## 2020-07-20 DIAGNOSIS — R569 Unspecified convulsions: Secondary | ICD-10-CM | POA: Diagnosis not present

## 2020-07-20 DIAGNOSIS — Z79899 Other long term (current) drug therapy: Secondary | ICD-10-CM | POA: Diagnosis not present

## 2020-07-20 DIAGNOSIS — L609 Nail disorder, unspecified: Secondary | ICD-10-CM | POA: Diagnosis not present

## 2020-07-20 DIAGNOSIS — I10 Essential (primary) hypertension: Secondary | ICD-10-CM | POA: Diagnosis not present

## 2020-07-20 DIAGNOSIS — M533 Sacrococcygeal disorders, not elsewhere classified: Secondary | ICD-10-CM | POA: Diagnosis not present

## 2020-08-10 ENCOUNTER — Other Ambulatory Visit: Payer: Self-pay | Admitting: Family Medicine

## 2020-08-10 ENCOUNTER — Ambulatory Visit
Admission: RE | Admit: 2020-08-10 | Discharge: 2020-08-10 | Disposition: A | Discharge status: Home / Self Care | Payer: Medicare HMO | Source: Ambulatory Visit | Attending: Family Medicine | Admitting: Family Medicine

## 2020-08-10 DIAGNOSIS — R0989 Other specified symptoms and signs involving the circulatory and respiratory systems: Secondary | ICD-10-CM | POA: Diagnosis not present

## 2020-08-10 DIAGNOSIS — M533 Sacrococcygeal disorders, not elsewhere classified: Secondary | ICD-10-CM

## 2020-08-10 DIAGNOSIS — M25551 Pain in right hip: Secondary | ICD-10-CM

## 2020-08-10 DIAGNOSIS — I517 Cardiomegaly: Secondary | ICD-10-CM | POA: Diagnosis not present

## 2020-08-10 DIAGNOSIS — M47814 Spondylosis without myelopathy or radiculopathy, thoracic region: Secondary | ICD-10-CM | POA: Diagnosis not present

## 2020-08-12 DIAGNOSIS — R399 Unspecified symptoms and signs involving the genitourinary system: Secondary | ICD-10-CM | POA: Diagnosis not present

## 2020-08-19 DIAGNOSIS — N390 Urinary tract infection, site not specified: Secondary | ICD-10-CM | POA: Diagnosis not present

## 2020-08-31 ENCOUNTER — Encounter (HOSPITAL_COMMUNITY): Payer: Self-pay | Admitting: *Deleted

## 2020-08-31 ENCOUNTER — Other Ambulatory Visit: Payer: Self-pay

## 2020-08-31 ENCOUNTER — Emergency Department (HOSPITAL_COMMUNITY)
Admission: EM | Admit: 2020-08-31 | Discharge: 2020-08-31 | Disposition: A | Discharge status: Home / Self Care | Payer: Medicare HMO | Attending: Emergency Medicine | Admitting: Emergency Medicine

## 2020-08-31 ENCOUNTER — Emergency Department (HOSPITAL_COMMUNITY): Payer: Medicare HMO

## 2020-08-31 DIAGNOSIS — Z87440 Personal history of urinary (tract) infections: Secondary | ICD-10-CM | POA: Diagnosis not present

## 2020-08-31 DIAGNOSIS — N390 Urinary tract infection, site not specified: Secondary | ICD-10-CM | POA: Insufficient documentation

## 2020-08-31 DIAGNOSIS — K449 Diaphragmatic hernia without obstruction or gangrene: Secondary | ICD-10-CM | POA: Diagnosis not present

## 2020-08-31 DIAGNOSIS — I10 Essential (primary) hypertension: Secondary | ICD-10-CM | POA: Insufficient documentation

## 2020-08-31 DIAGNOSIS — R319 Hematuria, unspecified: Secondary | ICD-10-CM

## 2020-08-31 DIAGNOSIS — K429 Umbilical hernia without obstruction or gangrene: Secondary | ICD-10-CM | POA: Diagnosis not present

## 2020-08-31 DIAGNOSIS — Z87891 Personal history of nicotine dependence: Secondary | ICD-10-CM | POA: Insufficient documentation

## 2020-08-31 DIAGNOSIS — B9689 Other specified bacterial agents as the cause of diseases classified elsewhere: Secondary | ICD-10-CM | POA: Diagnosis not present

## 2020-08-31 LAB — CBC WITH DIFFERENTIAL/PLATELET
Abs Immature Granulocytes: 0.03 10*3/uL (ref 0.00–0.07)
Basophils Absolute: 0.1 10*3/uL (ref 0.0–0.1)
Basophils Relative: 1 %
Eosinophils Absolute: 0.3 10*3/uL (ref 0.0–0.5)
Eosinophils Relative: 4 %
HCT: 40 % (ref 36.0–46.0)
Hemoglobin: 13.1 g/dL (ref 12.0–15.0)
Immature Granulocytes: 0 %
Lymphocytes Relative: 36 %
Lymphs Abs: 2.9 10*3/uL (ref 0.7–4.0)
MCH: 30.2 pg (ref 26.0–34.0)
MCHC: 32.8 g/dL (ref 30.0–36.0)
MCV: 92.2 fL (ref 80.0–100.0)
Monocytes Absolute: 0.7 10*3/uL (ref 0.1–1.0)
Monocytes Relative: 9 %
Neutro Abs: 3.8 10*3/uL (ref 1.7–7.7)
Neutrophils Relative %: 50 %
Platelets: 345 10*3/uL (ref 150–400)
RBC: 4.34 MIL/uL (ref 3.87–5.11)
RDW: 12.8 % (ref 11.5–15.5)
WBC: 7.8 10*3/uL (ref 4.0–10.5)
nRBC: 0 % (ref 0.0–0.2)

## 2020-08-31 LAB — URINALYSIS, ROUTINE W REFLEX MICROSCOPIC
Bacteria, UA: NONE SEEN
Bilirubin Urine: NEGATIVE
Glucose, UA: NEGATIVE mg/dL
Ketones, ur: NEGATIVE mg/dL
Nitrite: NEGATIVE
Protein, ur: NEGATIVE mg/dL
RBC / HPF: >50 RBC/hpf — ABNORMAL HIGH (ref 0–5)
Specific Gravity, Urine: 1.012 (ref 1.005–1.030)
pH: 6 (ref 5.0–8.0)

## 2020-08-31 LAB — COMPREHENSIVE METABOLIC PANEL
ALT: 21 U/L (ref 0–44)
AST: 24 U/L (ref 15–41)
Albumin: 4.2 g/dL (ref 3.5–5.0)
Alkaline Phosphatase: 51 U/L (ref 38–126)
Anion gap: 10 (ref 5–15)
BUN: 29 mg/dL — ABNORMAL HIGH (ref 8–23)
CO2: 24 mmol/L (ref 22–32)
Calcium: 9.2 mg/dL (ref 8.9–10.3)
Chloride: 100 mmol/L (ref 98–111)
Creatinine, Ser: 0.94 mg/dL (ref 0.44–1.00)
GFR, Estimated: >60 mL/min (ref >60)
Glucose, Bld: 132 mg/dL — ABNORMAL HIGH (ref 70–99)
Potassium: 3.7 mmol/L (ref 3.5–5.1)
Sodium: 134 mmol/L — ABNORMAL LOW (ref 135–145)
Total Bilirubin: 0.3 mg/dL (ref 0.3–1.2)
Total Protein: 6.7 g/dL (ref 6.5–8.1)

## 2020-08-31 MED ORDER — FOSFOMYCIN TROMETHAMINE 3 G PO PACK: 3.0000 g | PACK | Freq: Once | ORAL | 0 refills | Status: AC

## 2020-08-31 NOTE — ED Triage Notes (Signed)
Pt reports blood in urine x 1 week. She reports UTI a couple weeks ago, placed on fosfomycin. Reports frequent UTI.

## 2020-08-31 NOTE — Discharge Instructions (Addendum)
Take the antibiotics as prescribed. We have sent your urine for culture so if you need to be on a different antibiotic we will call you and let you know. Follow-up with the urologist listed below. Return to the ER if you start to experience worsening symptoms, severe abdominal pain, chest pain, shortness of breath.

## 2020-08-31 NOTE — ED Provider Notes (Signed)
[facility] DEPT Provider Note   CSN: [id number] Arrival date & time: 08/31/20  1021     History Chief Complaint  Patient presents with   Hematuria    [name] is a 70 y.o. female with a past medical history of fibromyalgia, GERD, hypertension presenting to the ED with a chief complaint of hematuria.  States that symptoms initially occurred about 1 to 2 weeks ago and was told that it could be due to infection and prescribed fosfomycin.  She took this and this improved the symptoms.  However last night she had 2 episodes of gross hematuria again.  She is having some dysuria as well.  Has some soreness in her lower abdomen.  She denies any fever, vomiting, diarrhea.  She urinated again this morning and does not believe that she had the hematuria.  Denies any vaginal bleeding.  No bloody stools.  HPI     Past Medical History:  Diagnosis Date   Aneurysm ([place])    brain   Anxiety    Arthritis    Depression    Fibromyalgia    GERD (gastroesophageal reflux disease)    Hyperlipidemia    pt. thought she was placed on medication for cholesterol but does not know for sure.   Hypertension    Insomnia    Seizures ([place])    1998-1999    Patient Active Problem List   Diagnosis Date Noted   Adjustment disorder with depressed mood 07/13/2019   Pancreatitis 07/07/2019   Chronic diarrhea 07/07/2019   UTI (urinary tract infection) 07/07/2019   AKI (acute kidney injury) ([place]) 07/07/2019   Depression with suicidal ideation 07/07/2019   Vitamin D deficiency 02/15/2011   Fibromyalgia 02/14/2011   High risk medication use 02/14/2011   Hypertension 01/07/2011   Suicidal thoughts 01/06/2011   GERD (gastroesophageal reflux disease) 12/30/2010   Grief at loss of child 12/30/2010   Gait instability 11/28/2010   Hyponatremia 11/28/2010   Anxiety 09/01/2010   Nonruptured cerebral aneurysm 07/01/2010   Ulnar neuropathy 06/03/2010   Depression 06/03/2010    Insomnia 06/03/2010   Seizure ([place]) 06/03/2010   Recurrent UTI 06/03/2010   Arthritis 06/03/2010    Past Surgical History:  Procedure Laterality Date   ABDOMINAL HYSTERECTOMY     BREAST CYST EXCISION Right    CEREBRAL ANEURYSM REPAIR     CEREBRAL ANEURYSM REPAIR     clipped    COLONOSCOPY     12+ years ago [place]   POLYPECTOMY       OB History   No obstetric history on file.     Family History  Problem Relation Age of Onset   Heart disease Mother    Heart disease Father    Cancer Brother    Arthritis Brother    Colon cancer Paternal Grandfather    Esophageal cancer Son    Colon polyps Neg Hx    Rectal cancer Neg Hx    Stomach cancer Neg Hx     Social History   Tobacco Use   Smoking status: Former    Types: Cigarettes    Quit date: 1985    Years since quitting: 37.6   Smokeless tobacco: Never  Substance Use Topics   Alcohol use: No   Drug use: No    Home Medications Prior to Admission medications   Medication Sig Start Date End Date Taking? Authorizing Provider  acetaminophen (TYLENOL) 500 MG tablet Take 1,000 mg by mouth every 6 (six) hours as needed for  mild pain.   Yes [provider]  amLODipine (NORVASC) 5 MG tablet Take 5 mg by mouth daily. 06/17/19  Yes [provider]  baclofen (LIORESAL) 10 MG tablet Take 10 mg by mouth at bedtime.   Yes [provider]  cloNIDine (CATAPRES) 0.1 MG tablet TAKE 1 TABLET(0.1 MG) BY MOUTH TWICE DAILY Patient taking differently: Take 0.1 mg by mouth 2 (two) times daily. 01/22/17  Yes Josue Hector, MD  fenofibrate 54 MG tablet Take 54 mg by mouth daily.  03/22/17  Yes [provider]  fosfomycin (MONUROL) 3 g PACK Take 3 g by mouth once for 1 dose. 08/31/20 08/31/20 Yes Cyra Spader, PA-C  hydrochlorothiazide (HYDRODIURIL) 25 MG tablet Take 25 mg by mouth daily. 08/18/19  Yes [provider]  levETIRAcetam (KEPPRA) 500 MG tablet Take 1 tablet (500 mg total) by mouth 2 (two) times  daily. 06/02/10  Yes Judithann Sheen, MD  losartan (COZAAR) 100 MG tablet Take 100 mg by mouth daily. 05/15/19  Yes [provider]  metoprolol tartrate (LOPRESSOR) 50 MG tablet Take 50 mg by mouth 2 (two) times daily.    Yes [provider]  naproxen sodium (ALEVE) 220 MG tablet Take 440 mg by mouth 2 (two) times daily as needed (pain).   Yes [provider]  omeprazole (PRILOSEC) 20 MG capsule Take 20 mg by mouth at bedtime.    Yes [provider]  traZODone (DESYREL) 100 MG tablet Take 1 tablet (100 mg total) by mouth at bedtime. 07/14/19  Yes Charlynne Cousins, MD    Allergies    Barium-containing compounds, Eggs or egg-derived products, and Red dye  Review of Systems   Review of Systems  Constitutional:  Negative for appetite change, chills and fever.  HENT:  Negative for ear pain, rhinorrhea, sneezing and sore throat.   Eyes:  Negative for photophobia and visual disturbance.  Respiratory:  Negative for cough, chest tightness, shortness of breath and wheezing.   Cardiovascular:  Negative for chest pain and palpitations.  Gastrointestinal:  Positive for abdominal pain. Negative for blood in stool, constipation, diarrhea, nausea and vomiting.  Genitourinary:  Positive for dysuria and hematuria. Negative for urgency.  Musculoskeletal:  Negative for myalgias.  Skin:  Negative for rash.  Neurological:  Negative for dizziness, weakness and light-headedness.   Physical Exam Updated Vital Signs BP 109/89   Pulse 64   Temp 98.2 F (36.8 C) (Oral)   Resp 16   Ht '5\' 6"'$  (1.676 m)   Wt 78.9 kg   SpO2 93%   BMI 28.08 kg/m   Physical Exam Vitals and nursing note reviewed.  Constitutional:      General: She is not in acute distress.    Appearance: She is well-developed.  HENT:     Head: Normocephalic and atraumatic.     Nose: Nose normal.  Eyes:     General: No scleral icterus.       Left eye: No discharge.     Conjunctiva/sclera:  Conjunctivae normal.  Cardiovascular:     Rate and Rhythm: Normal rate and regular rhythm.     Heart sounds: Normal heart sounds. No murmur heard.   No friction rub. No gallop.  Pulmonary:     Effort: Pulmonary effort is normal. No respiratory distress.     Breath sounds: Normal breath sounds.  Abdominal:     General: Bowel sounds are normal. There is no distension.     Palpations: Abdomen is soft.  Tenderness: There is no abdominal tenderness. There is no guarding.  Musculoskeletal:        General: Normal range of motion.     Cervical back: Normal range of motion and neck supple.  Skin:    General: Skin is warm and dry.     Findings: No rash.  Neurological:     Mental Status: She is alert.     Motor: No abnormal muscle tone.     Coordination: Coordination normal.    ED Results / Procedures / Treatments   Labs (all labs ordered are listed, but only abnormal results are displayed) Labs Reviewed  URINALYSIS, ROUTINE W REFLEX MICROSCOPIC - Abnormal; Notable for the following components:      Result Value   APPearance HAZY (*)    Hgb urine dipstick LARGE (*)    Leukocytes,Ua SMALL (*)    RBC / HPF >50 (*)    All other components within normal limits  COMPREHENSIVE METABOLIC PANEL - Abnormal; Notable for the following components:   Sodium 134 (*)    Glucose, Bld 132 (*)    BUN 29 (*)    All other components within normal limits  URINE CULTURE  CBC WITH DIFFERENTIAL/PLATELET    EKG None  Radiology CT Renal Stone Study  Result Date: 08/31/2020 CLINICAL DATA:  Hematuria for 1 week. Recent urinary tract infection. EXAM: CT ABDOMEN AND PELVIS WITHOUT CONTRAST TECHNIQUE: Multidetector CT imaging of the abdomen and pelvis was performed following the standard protocol without IV contrast. COMPARISON:  Contrast enhanced abdominopelvic CT 07/07/2019. FINDINGS: Lower chest: Mild chronic scarring and central airway thickening at both lung bases. There is a small hiatal hernia and  mild aortic atherosclerosis. Hepatobiliary: The liver appears unremarkable as imaged in the noncontrast state. The gallbladder is incompletely distended. No evidence of gallstones, gallbladder wall thickening or biliary dilatation. Pancreas: Unremarkable. No pancreatic ductal dilatation or surrounding inflammatory changes. Spleen: Normal in size without focal abnormality. Adrenals/Urinary Tract: Both adrenal glands appear normal. The kidneys appear unremarkable as imaged in the noncontrast state. There is no evidence of urinary tract calculus, hydronephrosis or perinephric soft tissue stranding. The bladder is mildly distended, although improved from the previous study. No evidence of bladder wall thickening or surrounding inflammation. Stomach/Bowel: No enteric contrast administered. Small hiatal hernia. The stomach appears unremarkable for its degree of distension. No evidence of bowel wall thickening, distention or surrounding inflammatory change. The appendix appears normal. There is moderate stool throughout the colon. Vascular/Lymphatic: There are no enlarged abdominal or pelvic lymph nodes. Mild aortic and branch vessel atherosclerosis. Reproductive: Hysterectomy. No adnexal mass. Possible mild pelvic floor laxity. Other: Stable tiny umbilical hernia containing only fat. No ascites. Musculoskeletal: No acute or significant osseous findings. Mild lumbar spondylosis with stable Schmorl's node formation involving the superior endplate of L1. IMPRESSION: 1. No cause for hematuria identified on noncontrast imaging. No evidence of urinary tract calculus, hydronephrosis or perinephric soft tissue stranding. If the patient has persistent unexplained hematuria, further evaluation may be warranted. 2. No acute abdominopelvic findings. The inflammatory changes previously noted in the left upper quadrant have resolved. 3.  Aortic Atherosclerosis (ICD10-I70.0). Electronically Signed   By: Richardean Sale M.D.   On:  08/31/2020 14:40    Procedures Procedures   Medications Ordered in ED Medications - No data to display  ED Course  I have reviewed the triage vital signs and the nursing notes.  Pertinent labs & imaging results that were available during my care of the patient were  reviewed by me and considered in my medical decision making (see chart for details).  Clinical Course as of 08/31/20 1453  Tue Aug 31, 2020  1314 Hgb urine dipstick(!): LARGE [HK]  1314 Chalmers Guest): SMALL [HK]  1314 RBC / HPF(!): >50 [HK]  1314 WBC, UA: 21-50 [HK]  1314 Bacteria, UA: NONE SEEN [HK]  1319 Hemoglobin: 13.1 [HK]    Clinical Course User Index [HK] Delia Heady, PA-C   MDM Rules/Calculators/A&P                           70 year old female presenting to the ED for hematuria.  2 episodes of gross hematuria since last night.  Urinated today but unsure if she had the hematuria.  This occurred similarly 1 to 2 weeks ago and was treated with fosfomycin.  She has a long history of frequent UTIs but they typically do not present with hematuria.  Reports some soreness in her lower abdomen.  Denies fever.  On exam abdomen is soft.  Will obtain lab work and urinalysis and reassess.  CBC, CMP unremarkable.  Urinalysis with small leukocytes and some pyuria.  This was sent for culture.  CT renal stone study shows no abnormal findings, no cause for hematuria.  Suspect  This could be due to her acute infection.  Will treat with antibiotics as she states fosfomycin has helped in the past.  I stressed to her the importance of following up and establishing care with urology again as they are able to do further work-up not only for her recurrent UTIs before her hematuria as well.  She remains hemodynamically stable here.  Return precautions given.   Patient is hemodynamically stable, in NAD, and able to ambulate in the ED. Evaluation does not show pathology that would require ongoing emergent intervention or inpatient  treatment. I explained the diagnosis to the patient. Pain has been managed and has no complaints prior to discharge. Patient is comfortable with above plan and is stable for discharge at this time. All questions were answered prior to disposition. Strict return precautions for returning to the ED were discussed. Encouraged follow up with PCP.   An After Visit Summary was printed and given to the patient.   Portions of this note were generated with [employer]. Dictation errors may occur despite best attempts at proofreading.  Final Clinical Impression(s) / ED Diagnoses Final diagnoses:  Hematuria, unspecified type  Lower urinary tract infectious disease    Rx / DC Orders ED Discharge Orders          Ordered    fosfomycin (MONUROL) 3 g PACK   Once        08/31/20 [place], [place], PA-C 08/31/20 1454    Milton Ferguson, MD 09/05/20 [phone]

## 2020-09-01 LAB — URINE CULTURE: Culture: <10000 — AB

## 2020-10-12 DIAGNOSIS — Z23 Encounter for immunization: Secondary | ICD-10-CM | POA: Diagnosis not present

## 2021-01-13 DIAGNOSIS — N3001 Acute cystitis with hematuria: Secondary | ICD-10-CM | POA: Diagnosis not present

## 2021-01-19 DIAGNOSIS — R300 Dysuria: Secondary | ICD-10-CM | POA: Diagnosis not present

## 2021-01-19 DIAGNOSIS — R195 Other fecal abnormalities: Secondary | ICD-10-CM | POA: Diagnosis not present

## 2021-01-19 DIAGNOSIS — N390 Urinary tract infection, site not specified: Secondary | ICD-10-CM | POA: Diagnosis not present

## 2021-02-22 DIAGNOSIS — I10 Essential (primary) hypertension: Secondary | ICD-10-CM | POA: Diagnosis not present

## 2021-02-22 DIAGNOSIS — E7800 Pure hypercholesterolemia, unspecified: Secondary | ICD-10-CM | POA: Diagnosis not present

## 2021-02-22 DIAGNOSIS — I700 Atherosclerosis of aorta: Secondary | ICD-10-CM | POA: Diagnosis not present

## 2021-02-22 DIAGNOSIS — R7301 Impaired fasting glucose: Secondary | ICD-10-CM | POA: Diagnosis not present

## 2021-02-22 DIAGNOSIS — G5793 Unspecified mononeuropathy of bilateral lower limbs: Secondary | ICD-10-CM | POA: Diagnosis not present

## 2021-02-22 DIAGNOSIS — R69 Illness, unspecified: Secondary | ICD-10-CM | POA: Diagnosis not present

## 2021-02-22 DIAGNOSIS — Z79899 Other long term (current) drug therapy: Secondary | ICD-10-CM | POA: Diagnosis not present

## 2021-02-22 DIAGNOSIS — R569 Unspecified convulsions: Secondary | ICD-10-CM | POA: Diagnosis not present

## 2021-03-01 DIAGNOSIS — N3021 Other chronic cystitis with hematuria: Secondary | ICD-10-CM | POA: Diagnosis not present

## 2021-03-01 DIAGNOSIS — R3121 Asymptomatic microscopic hematuria: Secondary | ICD-10-CM | POA: Diagnosis not present

## 2021-03-04 DIAGNOSIS — H524 Presbyopia: Secondary | ICD-10-CM | POA: Diagnosis not present

## 2021-03-04 DIAGNOSIS — H5203 Hypermetropia, bilateral: Secondary | ICD-10-CM | POA: Diagnosis not present

## 2021-03-04 DIAGNOSIS — H2513 Age-related nuclear cataract, bilateral: Secondary | ICD-10-CM | POA: Diagnosis not present

## 2021-03-04 DIAGNOSIS — H40053 Ocular hypertension, bilateral: Secondary | ICD-10-CM | POA: Diagnosis not present

## 2021-03-08 DIAGNOSIS — Z79899 Other long term (current) drug therapy: Secondary | ICD-10-CM | POA: Diagnosis not present

## 2021-03-08 DIAGNOSIS — E7800 Pure hypercholesterolemia, unspecified: Secondary | ICD-10-CM | POA: Diagnosis not present

## 2021-03-08 DIAGNOSIS — R7309 Other abnormal glucose: Secondary | ICD-10-CM | POA: Diagnosis not present

## 2021-03-23 ENCOUNTER — Telehealth: Payer: Self-pay | Admitting: Cardiology

## 2021-03-23 NOTE — Telephone Encounter (Signed)
Called pt to follow up on call regarding BP.  Pt reports does not check BP at home can determine BP elevated based on HA.  Last BP reading from about a week ago at PCP office SBP 168.  Denies high sodium diet expresses Na level runs low.  Does not have increased stress, exercises regularly, feels that elevated BP is genetics.  Advised pt the best way to manage HTN is with actual BP readings.  Pt expresses that does not feel this is needed as HA alert to high BP.  Reviewed medications with pt.  Continues on all BP meds except PCP stopped losartan 100 mg PO QD and started irbesartan 300 mg PO QD.  Advised pt that will route to Dr. Quentin Ore to review.  Expressed that did not call Dr. Quentin Ore office called to schedule with Dr. Oval Linsey to control BP as pt has hx of aneurysm.  Asked pt how would like this matter handled.  Would like recommendations on BP control and to be scheduled with Dr.[place].  Will route to [facility] nurse to address BP concern.   ?

## 2021-03-23 NOTE — Telephone Encounter (Signed)
Pt c/o BP issue: STAT if pt c/o blurred vision, one-sided weakness or slurred speech ? ?1. What are your last 5 BP readings?  ?Patient does not have any readings ? ?2. Are you having any other symptoms (ex. Dizziness, headache, blurred vision, passed out)?  ?headaches ? ?3. What is your BP issue?  ? ?Patient states her BP has been elevated, systolic was at 614 during most recent PCP visit. Causes headaches often. ? ? ?

## 2021-03-23 NOTE — Telephone Encounter (Signed)
Patient is requesting to switch providers from Dr. Quentin Ore, EP to Dr. Oval Linsey. She states Dr. Oval Linsey has been recommended to her to HTN management. ? ?Please advise as able, ?Thank you ?

## 2021-03-25 NOTE — Telephone Encounter (Signed)
The patient has been given Dr. Mardene Speak recommendations. Patient states her PCP, "is not controlling my blood pressure enough, that is why I reached out to get more help."  ? ?Of note is a 2nd phone note patient asked to switch providers to Dr. Oval Linsey. Both providers okayed.  ? ?Will forward message to scheduling to get appt with Dr. Oval Linsey for HTN.  ?

## 2021-03-25 NOTE — Telephone Encounter (Signed)
Recommend follow up with primary care physician for BP. Needs to check BP 2-3 times per week and bring recordings to his PCP appointment.  ?CL  ?

## 2021-04-05 ENCOUNTER — Other Ambulatory Visit: Payer: Self-pay

## 2021-04-07 ENCOUNTER — Emergency Department (HOSPITAL_COMMUNITY)
Admission: EM | Admit: 2021-04-07 | Discharge: 2021-04-07 | Disposition: A | Discharge status: Home / Self Care | Payer: Medicare HMO | Attending: Emergency Medicine | Admitting: Emergency Medicine

## 2021-04-07 ENCOUNTER — Emergency Department (HOSPITAL_COMMUNITY): Payer: Medicare HMO

## 2021-04-07 ENCOUNTER — Encounter (HOSPITAL_COMMUNITY): Payer: Self-pay | Admitting: Emergency Medicine

## 2021-04-07 DIAGNOSIS — R0989 Other specified symptoms and signs involving the circulatory and respiratory systems: Secondary | ICD-10-CM | POA: Insufficient documentation

## 2021-04-07 DIAGNOSIS — R059 Cough, unspecified: Secondary | ICD-10-CM | POA: Diagnosis present

## 2021-04-07 DIAGNOSIS — R0981 Nasal congestion: Secondary | ICD-10-CM | POA: Insufficient documentation

## 2021-04-07 DIAGNOSIS — Z79899 Other long term (current) drug therapy: Secondary | ICD-10-CM | POA: Diagnosis not present

## 2021-04-07 DIAGNOSIS — R519 Headache, unspecified: Secondary | ICD-10-CM | POA: Diagnosis not present

## 2021-04-07 DIAGNOSIS — R0789 Other chest pain: Secondary | ICD-10-CM | POA: Insufficient documentation

## 2021-04-07 DIAGNOSIS — I10 Essential (primary) hypertension: Secondary | ICD-10-CM | POA: Diagnosis not present

## 2021-04-07 DIAGNOSIS — R051 Acute cough: Secondary | ICD-10-CM | POA: Diagnosis not present

## 2021-04-07 DIAGNOSIS — I700 Atherosclerosis of aorta: Secondary | ICD-10-CM | POA: Diagnosis not present

## 2021-04-07 LAB — CBC
HCT: 37.5 % (ref 36.0–46.0)
Hemoglobin: 12.4 g/dL (ref 12.0–15.0)
MCH: 28.7 pg (ref 26.0–34.0)
MCHC: 33.1 g/dL (ref 30.0–36.0)
MCV: 86.8 fL (ref 80.0–100.0)
Platelets: 333 10*3/uL (ref 150–400)
RBC: 4.32 MIL/uL (ref 3.87–5.11)
RDW: 12.7 % (ref 11.5–15.5)
WBC: 9.6 10*3/uL (ref 4.0–10.5)
nRBC: 0 % (ref 0.0–0.2)

## 2021-04-07 LAB — BASIC METABOLIC PANEL
Anion gap: 11 (ref 5–15)
BUN: 26 mg/dL — ABNORMAL HIGH (ref 8–23)
CO2: 26 mmol/L (ref 22–32)
Calcium: 8.8 mg/dL — ABNORMAL LOW (ref 8.9–10.3)
Chloride: 93 mmol/L — ABNORMAL LOW (ref 98–111)
Creatinine, Ser: 0.91 mg/dL (ref 0.44–1.00)
GFR, Estimated: >60 mL/min (ref >60)
Glucose, Bld: 89 mg/dL (ref 70–99)
Potassium: 4.1 mmol/L (ref 3.5–5.1)
Sodium: 130 mmol/L — ABNORMAL LOW (ref 135–145)

## 2021-04-07 LAB — TROPONIN I (HIGH SENSITIVITY)
Troponin I (High Sensitivity): 5 ng/L (ref <18)
Troponin I (High Sensitivity): 6 ng/L (ref <18)

## 2021-04-07 MED ORDER — LEVETIRACETAM 500 MG PO TABS
500.0000 mg | ORAL_TABLET | Freq: Once | ORAL | Status: AC
  Administered 2021-04-07: 500 mg via ORAL
  Filled 2021-04-07: qty 1

## 2021-04-07 NOTE — ED Triage Notes (Addendum)
Patient here with complaint of generalized body aches and nasal congestion that started approximately one week ago. Patient has been self medicating with tylenol but states after symptoms have persisted for an entire week she would like to be evaluated. Patient is alert, oriented, speaking in complete sentences, and in no apparent distress at this time. ? ?Patient also complains of chest pain and shortness of breath that started before the body aches that she would like evaluated. ?

## 2021-04-07 NOTE — Discharge Instructions (Addendum)
You were evaluated in the Emergency Department and after careful evaluation, we did not find any emergent condition requiring admission or further testing in the hospital. ? ?Your exam/testing today was overall reassuring.  You can try over-the-counter Coricidin or Zyrtec/Claritin to help with your overall congestion. ? ?Please return to the Emergency Department if you experience any worsening of your condition.  Thank you for allowing [place] to be a part of your care.  Please follow-up with your PCP for further evaluation. ?

## 2021-04-07 NOTE — ED Provider Notes (Signed)
?[name] ?Provider Note ? ? ?CSN: [id number] ?Arrival date & time: 04/07/21  1714 ? ?  ? ?History ? ?Chief Complaint  ?Patient presents with  ? Chest Pain  ? ? ?[name] is a 71 y.o. female with history of hypertension, fibromyalgia, GERD, and hypertension and seizure disorder on Keppra presenting to the ED for 1 week of cough, congestion, runny nose.  Patient denies any fevers, chills, nausea, vomiting.  Patient denies any chest pain or shortness of breath.  She does report that when she coughs she feels chest tightness and it is reproducible when she pushes on that area.  She denies any palpitations.  She reports a headache but states that it comes on if she does not take her Keppra dose on time and her last dose was mostly at 6 PM.  Patient denies any additional symptoms at this time. ? ? ?Chest Pain ?Associated symptoms: no abdominal pain, no back pain, no fatigue, no fever, no numbness and no palpitations   ? ?  ? ?Home Medications ?Prior to Admission medications   ?Medication Sig Start Date End Date Taking? Authorizing Provider  ?acetaminophen (TYLENOL) 500 MG tablet Take 1,000 mg by mouth every 6 (six) hours as needed for mild pain.    [provider]  ?amLODipine (NORVASC) 5 MG tablet Take 5 mg by mouth daily. 06/17/19   [provider]  ?baclofen (LIORESAL) 10 MG tablet Take 10 mg by mouth at bedtime.    [provider]  ?cloNIDine (CATAPRES) 0.1 MG tablet TAKE 1 TABLET(0.1 MG) BY MOUTH TWICE DAILY ?Patient taking differently: Take 0.1 mg by mouth 2 (two) times daily. 01/22/17   Josue Hector, MD  ?fenofibrate 54 MG tablet Take 54 mg by mouth daily.  03/22/17   [provider]  ?hydrochlorothiazide (HYDRODIURIL) 25 MG tablet Take 25 mg by mouth daily. 08/18/19   [provider]  ?levETIRAcetam (KEPPRA) 500 MG tablet Take 1 tablet (500 mg total) by mouth 2 (two) times daily. 06/02/10   Judithann Sheen, MD  ?losartan  (COZAAR) 100 MG tablet Take 100 mg by mouth daily. 05/15/19   [provider]  ?metoprolol tartrate (LOPRESSOR) 50 MG tablet Take 50 mg by mouth 2 (two) times daily.     [provider]  ?naproxen sodium (ALEVE) 220 MG tablet Take 440 mg by mouth 2 (two) times daily as needed (pain).    [provider]  ?omeprazole (PRILOSEC) 20 MG capsule Take 20 mg by mouth at bedtime.     [provider]  ?traZODone (DESYREL) 100 MG tablet Take 1 tablet (100 mg total) by mouth at bedtime. 07/14/19   Charlynne Cousins, MD  ?   ? ?Allergies    ?Barium-containing compounds, Eggs or egg-derived products, and Red dye   ? ?Review of Systems   ?Review of Systems  ?Constitutional:  Negative for activity change, fatigue and fever.  ?HENT:  Positive for congestion, postnasal drip, rhinorrhea and sinus pressure. Negative for sore throat.   ?Cardiovascular:  Positive for chest pain. Negative for palpitations.  ?Gastrointestinal:  Negative for abdominal distention and abdominal pain.  ?Musculoskeletal:  Negative for arthralgias and back pain.  ?Skin:  Negative for rash and wound.  ?Neurological:  Negative for light-headedness and numbness.  ? ?Physical Exam ?Updated Vital Signs ?BP (!) 172/71 (BP Location: Right Arm)   Pulse 79   Temp 98.4 ?F (36.9 ?C) (Oral)   Resp 16   SpO2 98%  ?  Physical Exam ? ?ED Results / Procedures / Treatments   ?Labs ?(all labs ordered are listed, but only abnormal results are displayed) ?Labs Reviewed  ?BASIC METABOLIC PANEL - Abnormal; Notable for the following components:  ?    Result Value  ? Sodium 130 (*)   ? Chloride 93 (*)   ? BUN 26 (*)   ? Calcium 8.8 (*)   ? All other components within normal limits  ?CBC  ?TROPONIN I (HIGH SENSITIVITY)  ?TROPONIN I (HIGH SENSITIVITY)  ? ? ?EKG ?EKG Interpretation ? ?Date/Time:  Thursday April 07 2021 17:30:27 EDT ?Ventricular Rate:  71 ?PR Interval:  170 ?QRS Duration: 66 ?QT Interval:  378 ?QTC Calculation: 410 ?R Axis:   4 ?Text  Interpretation: Normal sinus rhythm Possible Left atrial enlargement Anterior infarct , age undetermined Abnormal ECG Confirmed by Carmin Muskrat [phone]) on 04/07/2021 8:09:19 PM ? ?Radiology ?DG Chest 2 View ? ?Result Date: 04/07/2021 ?CLINICAL DATA:  Generalized body aches and nasal congestion. EXAM: CHEST - 2 VIEW COMPARISON:  August 10, 2020 FINDINGS: The heart size and mediastinal contours are within normal limits. There is mild to moderate severity calcification of the aortic arch. Both lungs are clear. The visualized skeletal structures are unremarkable. IMPRESSION: No active cardiopulmonary disease. Electronically Signed   By: Virgina Norfolk M.D.   On: 04/07/2021 18:05   ? ?Procedures ?Procedures  ? ? ?Medications Ordered in ED ?Medications  ?levETIRAcetam (KEPPRA) tablet 500 mg (has no administration in time range)  ? ? ?ED Course/ Medical Decision Making/ A&P ?  ?                        ?Medical Decision Making ?Amount and/or Complexity of Data Reviewed ?Labs: ordered. ?Radiology: ordered. ? ? ?71 year old female presenting to the ED for 1 week of cold-like symptoms and chest congestion. ? ?On exam, no acute distress.  Vital signs within normal limits.  Overall well-appearing.  Lungs clear to auscultation bilaterally.  Chest x-ray on my review which confirmed by radiology with no acute focal findings.  EKG with no ischemic changes with attending interpretation above.  Additional history obtained via chart review.  Given a Keppra dose due to missing her home dose.  Patient recommended Coricidin to help with her congestion as well as a second-generation antihistamine.  Patient recommended to follow-up with her PCP, she is unsure if she was to continue the same primary care provider therefore a additional provider given in her discharge paperwork.  Patient given strict return precautions.  She verbalizes agreement and understand with plan. ? ?Patient seen and congestion by attending, Dr. Vanita Panda, who agreed  with medical work-up, evaluation, and plan. ? ?Final Clinical Impression(s) / ED Diagnoses ?Final diagnoses:  ?Acute cough  ? ? ?Rx / DC Orders ?ED Discharge Orders   ? ? None  ? ?  ? ? ?  ?Lupita Dawn, MD ?04/08/21 0009 ? ?  ?Carmin Muskrat, MD ?04/08/21 2314 ? ?

## 2021-04-07 NOTE — ED Provider Triage Note (Signed)
Emergency Medicine Provider Triage Evaluation Note ? ?[name] , a 71 y.o. female  was evaluated in triage.  Pt complains of chest pain, shortness of breath which has been ongoing for about 1 week.  Patient reports this started as a URI infection.  She denies lightheadedness, radiation of her chest pain, palpitations.  She is without abdominal pain.  She also states that she has history of seizure disorder and is on Keppra.  She is 2 hours past due for her evening dose of Keppra.  She is requesting a dose of her Keppra.  We will provide one-time dose. ? ?Review of Systems  ?Positive: As above ?Negative: As above ? ?Physical Exam  ?BP (!) 172/71 (BP Location: Right Arm)   Pulse 79   Temp 98.4 ?F (36.9 ?C) (Oral)   Resp 16   SpO2 98%  ?Gen:   Awake, no distress   ?Resp:  Normal effort  ?MSK:   Moves extremities without difficulty  ?Other:   ? ?Medical Decision Making  ?Medically screening exam initiated at 7:47 PM.  Appropriate orders placed.  [name] was informed that the remainder of the evaluation will be completed by another provider, this initial triage assessment does not replace that evaluation, and the importance of remaining in the ED until their evaluation is complete. ? ? ?  ?Evlyn Courier, PA-C ?04/07/21 1949 ? ?

## 2021-04-15 DIAGNOSIS — H2513 Age-related nuclear cataract, bilateral: Secondary | ICD-10-CM | POA: Diagnosis not present

## 2021-04-15 DIAGNOSIS — H40053 Ocular hypertension, bilateral: Secondary | ICD-10-CM | POA: Diagnosis not present

## 2021-05-10 NOTE — Progress Notes (Incomplete)
?Cardiology Office Note:   ? ?Date:  05/10/2021  ? ?ID:  [name], DOB 07-08-1950, MRN [id number] ? ?PCP:  Lujean Amel, MD ?  ?[facility] Providers ?Cardiologist:  None { ?Click to update primary MD,subspecialty MD or APP then REFRESH:1}   ? ?Referring MD: Lujean Amel, MD  ? ?No chief complaint on file. ? ? ?History of Present Illness:   ? ?[name] is a 71 y.o. female with a hx of hypertension, hyperlipidemia, fibromyalgia, GERD, arthritis, brain aneurysm, and seizures (1998-1999), here for follow-up. She was previously a patient of Dr. Quentin Ore, last seen by him 01/14/2020. At that visit it was thought her dyspnea could be due to deconditioning or possible primary lung disease given her history of smoking. Her hypertension was controlled on clonidine, HCTZ, losartan, metoprolol, and amlodipine. On 03/23/2021 she called the office and reported her systolic BP was 423 at her recent PCP visit, which was causing frequent headaches. She was not monitoring her BP at home as her headaches alerted her to high blood pressures. Her PCP had stopped losartan 100 mg and started irbesartan 300 mg.  ? ?Today, *** ? ? ? ?Past Medical History:  ?Diagnosis Date  ? Aneurysm ([place])   ? brain  ? Anxiety   ? Arthritis   ? Depression   ? Fibromyalgia   ? GERD (gastroesophageal reflux disease)   ? Hyperlipidemia   ? pt. thought she was placed on medication for cholesterol but does not know for sure.  ? Hypertension   ? Insomnia   ? Seizures ([place])   ? [phone]  ? ? ?Past Surgical History:  ?Procedure Laterality Date  ? ABDOMINAL HYSTERECTOMY    ? BREAST CYST EXCISION Right   ? CEREBRAL ANEURYSM REPAIR    ? CEREBRAL ANEURYSM REPAIR    ? clipped   ? COLONOSCOPY    ? 12+ years ago [place]  ? POLYPECTOMY    ? ? ?Current Medications: ?No outpatient medications have been marked as taking for the 05/12/21 encounter (Appointment) with Skeet Latch, MD.  ? ?Current Facility-Administered Medications for the 05/12/21 encounter (Appointment)  with Skeet Latch, MD  ?Medication  ? 0.9 %  sodium chloride infusion  ?  ? ?Allergies:   Barium-containing compounds, Eggs or egg-derived products, and Red dye  ? ?Social History  ? ?Socioeconomic History  ? Marital status: Divorced  ?  Spouse name: Not on file  ? Number of children: Not on file  ? Years of education: Not on file  ? Highest education level: Not on file  ?Occupational History  ? Not on file  ?Tobacco Use  ? Smoking status: Former  ?  Types: Cigarettes  ?  Quit date: 5  ?  Years since quitting: 38.3  ? Smokeless tobacco: Never  ?Substance and Sexual Activity  ? Alcohol use: No  ? Drug use: No  ? Sexual activity: Not Currently  ?Other Topics Concern  ? Not on file  ?Social History Narrative  ? Not on file  ? ?Social Determinants of Health  ? ?Financial Resource Strain: Not on file  ?Food Insecurity: Not on file  ?Transportation Needs: Not on file  ?Physical Activity: Not on file  ?Stress: Not on file  ?Social Connections: Not on file  ?  ? ?Family History: ?The patient's family history includes Arthritis in her brother; Cancer in her brother; Colon cancer in her paternal grandfather; Esophageal cancer in her son; Heart disease in her father and mother. There is no history of  Colon polyps, Rectal cancer, or Stomach cancer. ? ?ROS:   ?Please see the history of present illness.    ?All other systems reviewed and are negative. ? ?EKGs/Labs/Other Studies Reviewed:   ? ?The following studies were reviewed today: ? ?Echo 12/25/2019: ? 1. Left ventricular ejection fraction, by estimation, is 60 to 65%. Left  ?ventricular ejection fraction by 3D volume is 60 %. The left ventricle has  ?normal function. The left ventricle has no regional wall motion  ?abnormalities. Left ventricular diastolic  ? parameters are consistent with Grade I diastolic dysfunction (impaired  ?relaxation). The average left ventricular global longitudinal strain is  ?-24.2 %. The global longitudinal strain is normal.  ? 2. Right  ventricular systolic function is normal. The right ventricular  ?size is normal. There is normal pulmonary artery systolic pressure.  ? 3. The mitral valve is grossly normal. Trivial mitral valve  ?regurgitation. No evidence of mitral stenosis.  ? 4. The aortic valve is tricuspid. Aortic valve regurgitation is not  ?visualized. No aortic stenosis is present.  ? 5. The inferior vena cava is normal in size with greater than 50%  ?respiratory variability, suggesting right atrial pressure of 3 mmHg.  ? ?Lexiscan Myoview 12/17/2019: ?The left ventricular ejection fraction is hyperdynamic (>65%). ?Nuclear stress EF: 84%. ?Blood pressure demonstrated a hypertensive response to exercise. ?There was no ST segment deviation noted during stress. ?No T wave inversion was noted during stress. ?This is a low risk study. ?  ?Normal perfusion. LVEF 84% with normal wall motion. Poor exercise tolerance with marked hypertensive response to exercise. Overall, a low risk study based on perfusion. ? ?ETT 03/01/2016: ?Blood pressure demonstrated a hypertensive response to exercise. ?There was no ST segment deviation noted during stress. ?No T wave inversion was noted during stress. ?Negative, adequate stress test. ? ? ?EKG:   EKG is personally reviewed. ?05/12/2021: Sinus ***. Rate *** bpm. ? ?Recent Labs: ?08/31/2020: ALT 21 ?04/07/2021: BUN 26; Creatinine, Ser 0.91; Hemoglobin 12.4; Platelets 333; Potassium 4.1; Sodium 130  ? ?Recent Lipid Panel ?   ?Component Value Date/Time  ? CHOL 217 (H) 06/07/2010 0920  ? TRIG 305 (H) 06/07/2010 0920  ? HDL 59 06/07/2010 0920  ? CHOLHDL 3.7 06/07/2010 0920  ? VLDL 61 (H) 06/07/2010 0920  ? [place] 97 06/07/2010 0920  ? ? ? ?Risk Assessment/Calculations:   ?{Does this patient have ATRIAL FIBRILLATION?:[phone]} ? ?    ? ?Physical Exam:   ? ?Wt Readings from Last 3 Encounters:  ?08/31/20 174 lb (78.9 kg)  ?01/14/20 169 lb (76.7 kg)  ?12/17/19 172 lb (78 kg)  ?  ? ?VS:  There were no vitals taken for this  visit. , BMI There is no height or weight on file to calculate BMI. ?GENERAL:  Well appearing ?HEENT: Pupils equal round and reactive, fundi not visualized, oral mucosa unremarkable ?NECK:  No jugular venous distention, waveform within normal limits, carotid upstroke brisk and symmetric, no bruits, no thyromegaly ?LYMPHATICS:  No cervical adenopathy ?LUNGS:  Clear to auscultation bilaterally ?HEART:  RRR.  PMI not displaced or sustained,S1 and S2 within normal limits, no S3, no S4, no clicks, no rubs, *** murmurs ?ABD:  Flat, positive bowel sounds normal in frequency in pitch, no bruits, no rebound, no guarding, no midline pulsatile mass, no hepatomegaly, no splenomegaly ?EXT:  2 plus pulses throughout, no edema, no cyanosis no clubbing ?SKIN:  No rashes no nodules ?NEURO:  Cranial nerves II through XII grossly intact, motor grossly intact throughout ?  PSYCH:  Cognitively intact, oriented to person place and time ? ? ?ASSESSMENT:   ? ?No diagnosis found. ?PLAN:   ? ?No problem-specific Assessment & Plan notes found for this encounter. ? ? ? ?{Are you ordering a CV Procedure (e.g. stress test, cath, DCCV, TEE, etc)?   Press F2        :[id number]}  ? ?Disposition: ?FU with Tiffany C. Oval Linsey, MD, [facility] in *** ? ?Medication Adjustments/Labs and Tests Ordered: ?Current medicines are reviewed at length with the patient today.  Concerns regarding medicines are outlined above.  ? ?No orders of the defined types were placed in this encounter. ? ?No orders of the defined types were placed in this encounter. ? ?There are no Patient Instructions on file for this visit.  ? ?I,Mathew Stumpf,acting as a Education administrator for Skeet Latch, MD.,have documented all relevant documentation on the behalf of Skeet Latch, MD,as directed by  Skeet Latch, MD while in the presence of Skeet Latch, MD. ? ?*** ? ?Signed, ?Madelin Rear  ?05/10/2021 11:40 AM    ?[place] ?

## 2021-05-12 ENCOUNTER — Ambulatory Visit (HOSPITAL_BASED_OUTPATIENT_CLINIC_OR_DEPARTMENT_OTHER): Payer: Medicare HMO | Admitting: Cardiovascular Disease

## 2021-05-16 ENCOUNTER — Ambulatory Visit (HOSPITAL_BASED_OUTPATIENT_CLINIC_OR_DEPARTMENT_OTHER): Payer: Medicare HMO | Admitting: Cardiovascular Disease

## 2021-06-06 ENCOUNTER — Encounter: Payer: Self-pay | Admitting: Cardiovascular Disease

## 2021-06-23 ENCOUNTER — Ambulatory Visit (HOSPITAL_BASED_OUTPATIENT_CLINIC_OR_DEPARTMENT_OTHER): Payer: Medicare HMO | Admitting: Cardiovascular Disease

## 2021-07-01 DIAGNOSIS — H40053 Ocular hypertension, bilateral: Secondary | ICD-10-CM | POA: Diagnosis not present

## 2021-07-01 DIAGNOSIS — R519 Headache, unspecified: Secondary | ICD-10-CM | POA: Diagnosis not present

## 2021-08-26 DIAGNOSIS — R300 Dysuria: Secondary | ICD-10-CM | POA: Diagnosis not present

## 2021-09-09 ENCOUNTER — Emergency Department (HOSPITAL_COMMUNITY)
Admission: EM | Admit: 2021-09-09 | Discharge: 2021-09-09 | Discharge status: Against Medical Advice | Payer: Medicare HMO | Attending: Emergency Medicine | Admitting: Emergency Medicine

## 2021-09-09 ENCOUNTER — Emergency Department (HOSPITAL_COMMUNITY)
Admission: EM | Admit: 2021-09-09 | Discharge: 2021-09-10 | Disposition: A | Discharge status: Home / Self Care | Payer: Medicare HMO | Source: Home / Self Care | Attending: Emergency Medicine | Admitting: Emergency Medicine

## 2021-09-09 ENCOUNTER — Encounter (HOSPITAL_COMMUNITY): Payer: Self-pay

## 2021-09-09 ENCOUNTER — Emergency Department (HOSPITAL_COMMUNITY): Payer: Medicare HMO

## 2021-09-09 ENCOUNTER — Other Ambulatory Visit: Payer: Self-pay

## 2021-09-09 ENCOUNTER — Encounter (HOSPITAL_COMMUNITY): Payer: Self-pay | Admitting: *Deleted

## 2021-09-09 DIAGNOSIS — R8279 Other abnormal findings on microbiological examination of urine: Secondary | ICD-10-CM | POA: Diagnosis not present

## 2021-09-09 DIAGNOSIS — Z20822 Contact with and (suspected) exposure to covid-19: Secondary | ICD-10-CM | POA: Insufficient documentation

## 2021-09-09 DIAGNOSIS — D649 Anemia, unspecified: Secondary | ICD-10-CM | POA: Insufficient documentation

## 2021-09-09 DIAGNOSIS — R5383 Other fatigue: Secondary | ICD-10-CM | POA: Insufficient documentation

## 2021-09-09 DIAGNOSIS — M79602 Pain in left arm: Secondary | ICD-10-CM | POA: Diagnosis not present

## 2021-09-09 DIAGNOSIS — R079 Chest pain, unspecified: Secondary | ICD-10-CM | POA: Insufficient documentation

## 2021-09-09 DIAGNOSIS — E860 Dehydration: Secondary | ICD-10-CM | POA: Diagnosis not present

## 2021-09-09 DIAGNOSIS — J9811 Atelectasis: Secondary | ICD-10-CM | POA: Diagnosis not present

## 2021-09-09 DIAGNOSIS — R0602 Shortness of breath: Secondary | ICD-10-CM | POA: Insufficient documentation

## 2021-09-09 DIAGNOSIS — R61 Generalized hyperhidrosis: Secondary | ICD-10-CM | POA: Insufficient documentation

## 2021-09-09 DIAGNOSIS — Z79899 Other long term (current) drug therapy: Secondary | ICD-10-CM | POA: Insufficient documentation

## 2021-09-09 DIAGNOSIS — R072 Precordial pain: Secondary | ICD-10-CM | POA: Insufficient documentation

## 2021-09-09 DIAGNOSIS — I10 Essential (primary) hypertension: Secondary | ICD-10-CM | POA: Insufficient documentation

## 2021-09-09 DIAGNOSIS — Z5321 Procedure and treatment not carried out due to patient leaving prior to being seen by health care provider: Secondary | ICD-10-CM | POA: Diagnosis not present

## 2021-09-09 DIAGNOSIS — N3021 Other chronic cystitis with hematuria: Secondary | ICD-10-CM | POA: Diagnosis not present

## 2021-09-09 DIAGNOSIS — R042 Hemoptysis: Secondary | ICD-10-CM | POA: Diagnosis not present

## 2021-09-09 DIAGNOSIS — R0789 Other chest pain: Secondary | ICD-10-CM | POA: Diagnosis not present

## 2021-09-09 DIAGNOSIS — Z743 Need for continuous supervision: Secondary | ICD-10-CM | POA: Diagnosis not present

## 2021-09-09 LAB — BASIC METABOLIC PANEL
Anion gap: 6 (ref 5–15)
BUN: 39 mg/dL — ABNORMAL HIGH (ref 8–23)
CO2: 25 mmol/L (ref 22–32)
Calcium: 8.7 mg/dL — ABNORMAL LOW (ref 8.9–10.3)
Chloride: 100 mmol/L (ref 98–111)
Creatinine, Ser: 1.43 mg/dL — ABNORMAL HIGH (ref 0.44–1.00)
GFR, Estimated: 39 mL/min — ABNORMAL LOW (ref >60)
Glucose, Bld: 105 mg/dL — ABNORMAL HIGH (ref 70–99)
Potassium: 4.5 mmol/L (ref 3.5–5.1)
Sodium: 131 mmol/L — ABNORMAL LOW (ref 135–145)

## 2021-09-09 LAB — TROPONIN I (HIGH SENSITIVITY)
Troponin I (High Sensitivity): 10 ng/L (ref <18)
Troponin I (High Sensitivity): 12 ng/L (ref <18)
Troponin I (High Sensitivity): 11 ng/L (ref <18)

## 2021-09-09 LAB — CBC
HCT: 32.1 % — ABNORMAL LOW (ref 36.0–46.0)
Hemoglobin: 10.2 g/dL — ABNORMAL LOW (ref 12.0–15.0)
MCH: 27 pg (ref 26.0–34.0)
MCHC: 31.8 g/dL (ref 30.0–36.0)
MCV: 84.9 fL (ref 80.0–100.0)
Platelets: 494 10*3/uL — ABNORMAL HIGH (ref 150–400)
RBC: 3.78 MIL/uL — ABNORMAL LOW (ref 3.87–5.11)
RDW: 14.7 % (ref 11.5–15.5)
WBC: 16.3 10*3/uL — ABNORMAL HIGH (ref 4.0–10.5)
nRBC: 0 % (ref 0.0–0.2)

## 2021-09-09 LAB — RESP PANEL BY RT-PCR (FLU A&B, COVID) ARPGX2
Influenza A by PCR: NEGATIVE
Influenza B by PCR: NEGATIVE
SARS Coronavirus 2 by RT PCR: NEGATIVE

## 2021-09-09 MED ORDER — ACETAMINOPHEN 325 MG PO TABS
650.0000 mg | ORAL_TABLET | Freq: Once | ORAL | Status: AC
  Administered 2021-09-10: 650 mg via ORAL
  Filled 2021-09-09: qty 2

## 2021-09-09 NOTE — ED Triage Notes (Signed)
Pt arrived via EMS, sent from dr office, chest pain, left arm pain.

## 2021-09-09 NOTE — ED Triage Notes (Signed)
The pt is c/o chest pain and sob for several  days and just in general not feeling well  no temp

## 2021-09-09 NOTE — ED Provider Triage Note (Signed)
Emergency Medicine Provider Triage Evaluation Note  [name] , a 71 y.o. female  was evaluated in triage.  Pt complains of pain, and overall not feeling well for the past 4 days.  Endorses generalized myalgias, but states chest pain is the worst.  Also endorses night sweats.  Review of Systems  Positive: As above Negative: As above  Physical Exam  Ht '5\' 6"'$  (1.676 m)   Wt 78.9 kg   BMI 28.08 kg/m  Gen:   Awake, no distress   Resp:  Normal effort  MSK:   Moves extremities without difficulty  Other:    Medical Decision Making  Medically screening exam initiated at 6:49 PM.  Appropriate orders placed.  [name] was informed that the remainder of the evaluation will be completed by another provider, this initial triage assessment does not replace that evaluation, and the importance of remaining in the ED until their evaluation is complete.     [name] Courier, PA-C 09/09/21 1850

## 2021-09-09 NOTE — ED Triage Notes (Signed)
The pt was just at [facility] long ed and waited for 5 hours and left because of the wait

## 2021-09-09 NOTE — ED Provider Triage Note (Signed)
Emergency Medicine Provider Triage Evaluation Note  [name] , a 71 y.o. female  was evaluated in triage.  Pt complains of chest pain radiating into her left arm.  Patient reports she has been feeling unwell Thursday, almost like she has flulike symptoms, and then started having intermittent central chest pain that radiates into her left arm.  She reports some associated diaphoresis.  She reports at the beginning of the week she was also having symptoms of urinary tract infection and was started on multiple different medications to try and treat this.  Review of Systems  Positive: Chest pain, malaise, diaphoresis, cough Negative: Fever, shortness of breath, abdominal pain, vomiting  Physical Exam  BP (!) 130/58 (BP Location: Left Arm)   Pulse 66   Temp 98.3 F (36.8 C) (Oral)   Resp 17   SpO2 94%  Gen:   Awake, no distress   Resp:  Normal effort, CTA bilateral, RRR MSK:   Moves extremities without difficulty  Other:    Medical Decision Making  Medically screening exam initiated at 12:55 PM.  Appropriate orders placed.  [name] was informed that the remainder of the evaluation will be completed by another provider, this initial triage assessment does not replace that evaluation, and the importance of remaining in the ED until their evaluation is complete.     [name], [place] 09/09/21 1324

## 2021-09-10 LAB — CK: Total CK: 69 U/L (ref 38–234)

## 2021-09-10 MED ORDER — SODIUM CHLORIDE 0.9 % IV BOLUS (SEPSIS)
1000.0000 mL | Freq: Once | INTRAVENOUS | Status: AC
  Administered 2021-09-10: 1000 mL via INTRAVENOUS

## 2021-09-10 MED ORDER — ONDANSETRON 4 MG PO TBDP
8.0000 mg | ORAL_TABLET | Freq: Once | ORAL | Status: AC
  Administered 2021-09-10: 8 mg via ORAL
  Filled 2021-09-10: qty 2

## 2021-09-10 NOTE — ED Notes (Signed)
Patient verbalizes understanding of discharge instructions. Opportunity for questioning and answers were provided. Armband removed by staff, pt discharged from ED. Ambulated out to lobby  

## 2021-09-10 NOTE — Discharge Instructions (Addendum)
You will need to have your hemoglobin/CBC and electrolytes rechecked when you see your primary doctor  Your caregiver has diagnosed you as having chest pain that is not specific for one problem, but does not require admission.  Chest pain comes from many different causes.  SEEK IMMEDIATE MEDICAL ATTENTION IF: You have severe chest pain, especially if the pain is crushing or pressure-like and spreads to the arms, back, neck, or jaw, or if you have sweating, nausea (feeling sick to your stomach), or shortness of breath. THIS IS AN EMERGENCY. Don't wait to see if the pain will go away. Get medical help at once. Call 911 or 0 (operator). DO NOT drive yourself to the hospital.  Your chest pain gets worse and does not go away with rest.  You have an attack of chest pain lasting longer than usual, despite rest and treatment with the medications your caregiver has prescribed.  You wake from sleep with chest pain or shortness of breath.  You feel dizzy or faint.  You have chest pain not typical of your usual pain for which you originally saw your caregiver.

## 2021-09-10 NOTE — ED Provider Notes (Signed)
[facility] EMERGENCY DEPARTMENT Provider Note   CSN: [id number] Arrival date & time: 09/09/21  1820     History  Chief Complaint  Patient presents with   Chest Pain    [name] is a 71 y.o. female.  The history is provided by the patient.   Patient presents for multiple complaints.  She reports that she has had multiple UTIs recently has been placed on antibiotics.  This is been ongoing over the past several weeks.  She reports since that time she has had generalized fatigue, nausea, myalgias.  She is now been having central chest pain that rates the left arm for up to 4 days almost continuously.  She also reports shortness of breath.  She reports fatigue and feels sweaty at times.  No fevers or vomiting.  No focal weakness.  She reports overall malaise  Denies previous history of CAD Past Medical History:  Diagnosis Date   Aneurysm ([place])    brain   Anxiety    Arthritis    Depression    Fibromyalgia    GERD (gastroesophageal reflux disease)    Hyperlipidemia    pt. thought she was placed on medication for cholesterol but does not know for sure.   Hypertension    Insomnia    Seizures ([place])    1998-1999    Home Medications Prior to Admission medications   Medication Sig Start Date End Date Taking? Authorizing Provider  acetaminophen (TYLENOL) 500 MG tablet Take 1,000 mg by mouth every 6 (six) hours as needed for mild pain.    [provider]  amLODipine (NORVASC) 5 MG tablet Take 5 mg by mouth daily. 06/17/19   [provider]  baclofen (LIORESAL) 10 MG tablet Take 10 mg by mouth at bedtime.    [provider]  cloNIDine (CATAPRES) 0.1 MG tablet TAKE 1 TABLET(0.1 MG) BY MOUTH TWICE DAILY Patient taking differently: Take 0.1 mg by mouth 2 (two) times daily. 01/22/17   Josue Hector, MD  fenofibrate 54 MG tablet Take 54 mg by mouth daily.  03/22/17   [provider]  hydrochlorothiazide (HYDRODIURIL) 25 MG tablet  Take 25 mg by mouth daily. 08/18/19   [provider]  levETIRAcetam (KEPPRA) 500 MG tablet Take 1 tablet (500 mg total) by mouth 2 (two) times daily. 06/02/10   Judithann Sheen, MD  losartan (COZAAR) 100 MG tablet Take 100 mg by mouth daily. 05/15/19   [provider]  metoprolol tartrate (LOPRESSOR) 50 MG tablet Take 50 mg by mouth 2 (two) times daily.     [provider]  naproxen sodium (ALEVE) 220 MG tablet Take 440 mg by mouth 2 (two) times daily as needed (pain).    [provider]  omeprazole (PRILOSEC) 20 MG capsule Take 20 mg by mouth at bedtime.     [provider]  traZODone (DESYREL) 100 MG tablet Take 1 tablet (100 mg total) by mouth at bedtime. 07/14/19   Charlynne Cousins, MD      Allergies    Barium-containing compounds, Eggs or egg-derived products, and Red dye    Review of Systems   Review of Systems  Constitutional:  Positive for fatigue.  Respiratory:  Positive for shortness of breath.   Cardiovascular:  Positive for chest pain.  Musculoskeletal:  Positive for myalgias.    Physical Exam Updated Vital Signs BP 132/62 (BP Location: Left Arm)   Pulse 75   Temp 98.1 F (36.7 C) (Oral)  Resp 18   Ht 1.676 m ('5\' 6"'$ )   Wt 78.9 kg   SpO2 100%   BMI 28.08 kg/m  Physical Exam CONSTITUTIONAL: Elderly, no acute distress HEAD: Normocephalic/atraumatic EYES: EOMI/PERRL ENMT: Mucous membranes moist NECK: supple no meningeal signs SPINE/BACK:entire spine nontender CV: S1/S2 noted, no murmurs/rubs/gallops noted LUNGS: Lungs are clear to auscultation bilaterally, no apparent distress ABDOMEN: soft, nontender, no rebound or guarding, bowel sounds noted throughout abdomen GU:no cva tenderness NEURO: Pt is awake/alert/appropriate, moves all extremitiesx4.  No facial droop.  No arm or leg drift EXTREMITIES: pulses normal/equal, full ROM, no calf tenderness or erythema is noted SKIN: warm, color normal PSYCH: no abnormalities  of mood noted, alert and oriented to situation  ED Results / Procedures / Treatments   Labs (all labs ordered are listed, but only abnormal results are displayed) Labs Reviewed  RESP PANEL BY RT-PCR (FLU A&B, COVID) ARPGX2  CK  TROPONIN I (HIGH SENSITIVITY)  TROPONIN I (HIGH SENSITIVITY)    EKG EKG Interpretation  Date/Time:  Friday September 09 2021 18:36:48 EDT Ventricular Rate:  97 PR Interval:  154 QRS Duration: 68 QT Interval:  326 QTC Calculation: 414 R Axis:   45 Text Interpretation: Sinus rhythm with occasional Premature ventricular complexes Possible Left atrial enlargement Confirmed by Ripley Fraise [phone]) on 09/10/2021 1:19:15 AM  Radiology DG Chest 2 View  Result Date: 09/09/2021 CLINICAL DATA:  Chest pain, LEFT arm pain, hypertension EXAM: CHEST - 2 VIEW COMPARISON:  04/07/2021 FINDINGS: Enlargement of cardiac silhouette. Mediastinal contours and pulmonary vascularity normal. Atherosclerotic calcification aorta. Basilar hypoinflation and mild atelectasis with decreased lung volumes versus previous exam. No infiltrate, pleural effusion or pneumothorax. Osseous structures unremarkable. IMPRESSION: Hypoinflated lungs with bibasilar atelectasis and accentuation of heart size. Electronically Signed   By: Lavonia Dana M.D.   On: 09/09/2021 13:16    Procedures Procedures    Medications Ordered in ED Medications  sodium chloride 0.9 % bolus 1,000 mL (1,000 mLs Intravenous New Bag/Given 09/10/21 0403)  acetaminophen (TYLENOL) tablet 650 mg (650 mg Oral Given 09/10/21 0220)  ondansetron (ZOFRAN-ODT) disintegrating tablet 8 mg (8 mg Oral Given 09/10/21 0220)    ED Course/ Medical Decision Making/ A&P Clinical Course as of 09/10/21 0429  Sat Sep 10, 2021  0222 Patient presents with multiple complaints.  Labs from previous ER visit do reveal dehydration and mild anemia.  Patient was at urology office for UTI and then was sent to the ER.  She left the previous ER visit due to  wait time.  I have low suspicion for ACS at this time.  She reports multiple complaints including fatigue, nausea and body aches. [DW]  3716 Patient informed of renal insufficiency on previous labs.  We will give IV fluids.  She is mildly anemic, but denies any recent blood loss or melena.  This can be followed up as an outpatient [DW]  [phone] Patient feels improved.  She has been ambulatory.  At this time no indication for admission.  After IV fluids she will be discharged home.  Outpatient referral for cardiology has been placed.  She is in between primary care physicians and will follow-up soon and will need to have her labs rechecked [DW]    Clinical Course User Index [DW] Ripley Fraise, MD           HEART Score: 3                Medical Decision Making Amount and/or Complexity of Data Reviewed Labs: ordered.  Risk Prescription drug management.   This patient presents to the ED for concern of chest pain and fatigue, this involves an extensive number of treatment options, and is a complaint that carries with it a high risk of complications and morbidity.  The differential diagnosis includes but is not limited to acute coronary syndrome, aortic dissection, pulmonary embolism, pericarditis, pneumothorax, pneumonia, myocarditis, pleurisy, esophageal rupture    Comorbidities that complicate the patient evaluation: Patient's presentation is complicated by their history of hypertension  Social Determinants of Health: Patient's impaired access to primary care  increases the complexity of managing their presentation she is currently without a PCP   Lab Tests: I Ordered, and personally interpreted labs.  The pertinent results include: Dehydration noted on previous lab, mild anemia  Imaging Studies ordered: I ordered imaging studies including X-ray chest from previous ER visit I independently visualized and interpreted imaging which showed no acute findings I agree with the radiologist  interpretation  Cardiac Monitoring: The patient was maintained on a cardiac monitor.  I personally viewed and interpreted the cardiac monitor which showed an underlying rhythm of:  sinus rhythm  Medicines ordered and prescription drug management: I ordered medication including Zofran for nausea IV fluids for dehydration Reevaluation of the patient after these medicines showed that the patient    improved   Reevaluation: After the interventions noted above, I reevaluated the patient and found that they have :improved  Complexity of problems addressed: Patient's presentation is most consistent with  acute presentation with potential threat to life or bodily function  Disposition: After consideration of the diagnostic results and the patient's response to treatment,  I feel that the patent would benefit from discharge   .           Final Clinical Impression(s) / ED Diagnoses Final diagnoses:  Precordial pain  Dehydration    Rx / DC Orders ED Discharge Orders          Ordered    Ambulatory referral to Cardiology       Comments: If you have not heard from the Cardiology office within the next 72 hours please call [phone].   09/10/21 0354              Ripley Fraise, MD 09/10/21 [phone]

## 2021-09-13 DIAGNOSIS — E7800 Pure hypercholesterolemia, unspecified: Secondary | ICD-10-CM | POA: Diagnosis not present

## 2021-09-13 DIAGNOSIS — E2839 Other primary ovarian failure: Secondary | ICD-10-CM | POA: Diagnosis not present

## 2021-09-13 DIAGNOSIS — Z0001 Encounter for general adult medical examination with abnormal findings: Secondary | ICD-10-CM | POA: Diagnosis not present

## 2021-09-13 DIAGNOSIS — G5793 Unspecified mononeuropathy of bilateral lower limbs: Secondary | ICD-10-CM | POA: Diagnosis not present

## 2021-09-13 DIAGNOSIS — R5383 Other fatigue: Secondary | ICD-10-CM | POA: Diagnosis not present

## 2021-09-13 DIAGNOSIS — R569 Unspecified convulsions: Secondary | ICD-10-CM | POA: Diagnosis not present

## 2021-09-13 DIAGNOSIS — R058 Other specified cough: Secondary | ICD-10-CM | POA: Diagnosis not present

## 2021-09-13 DIAGNOSIS — Z79899 Other long term (current) drug therapy: Secondary | ICD-10-CM | POA: Diagnosis not present

## 2021-09-13 DIAGNOSIS — I10 Essential (primary) hypertension: Secondary | ICD-10-CM | POA: Diagnosis not present

## 2021-09-22 DIAGNOSIS — Z79899 Other long term (current) drug therapy: Secondary | ICD-10-CM | POA: Diagnosis not present

## 2021-09-22 DIAGNOSIS — D72829 Elevated white blood cell count, unspecified: Secondary | ICD-10-CM | POA: Diagnosis not present

## 2021-10-10 DIAGNOSIS — R8271 Bacteriuria: Secondary | ICD-10-CM | POA: Diagnosis not present

## 2021-10-10 DIAGNOSIS — N3021 Other chronic cystitis with hematuria: Secondary | ICD-10-CM | POA: Diagnosis not present

## 2021-10-17 ENCOUNTER — Ambulatory Visit: Payer: Medicare HMO | Admitting: Interventional Cardiology

## 2021-10-17 DIAGNOSIS — H2513 Age-related nuclear cataract, bilateral: Secondary | ICD-10-CM | POA: Diagnosis not present

## 2021-10-21 ENCOUNTER — Ambulatory Visit: Payer: Medicare HMO | Admitting: Interventional Cardiology

## 2021-11-04 ENCOUNTER — Ambulatory Visit: Payer: Medicare HMO | Admitting: Interventional Cardiology

## 2021-11-09 ENCOUNTER — Ambulatory Visit (INDEPENDENT_AMBULATORY_CARE_PROVIDER_SITE_OTHER): Payer: Medicare HMO | Admitting: Emergency Medicine

## 2021-11-09 ENCOUNTER — Telehealth: Payer: Self-pay | Admitting: Emergency Medicine

## 2021-11-09 ENCOUNTER — Encounter: Payer: Self-pay | Admitting: Emergency Medicine

## 2021-11-09 VITALS — BP 138/82 | HR 57 | Temp 97.7°F | Ht 66.0 in | Wt 183.4 lb

## 2021-11-09 DIAGNOSIS — Z23 Encounter for immunization: Secondary | ICD-10-CM | POA: Diagnosis not present

## 2021-11-09 DIAGNOSIS — R251 Tremor, unspecified: Secondary | ICD-10-CM

## 2021-11-09 DIAGNOSIS — D649 Anemia, unspecified: Secondary | ICD-10-CM

## 2021-11-09 DIAGNOSIS — N390 Urinary tract infection, site not specified: Secondary | ICD-10-CM | POA: Diagnosis not present

## 2021-11-09 DIAGNOSIS — Z8679 Personal history of other diseases of the circulatory system: Secondary | ICD-10-CM | POA: Diagnosis not present

## 2021-11-09 DIAGNOSIS — Z7689 Persons encountering health services in other specified circumstances: Secondary | ICD-10-CM | POA: Diagnosis not present

## 2021-11-09 DIAGNOSIS — N289 Disorder of kidney and ureter, unspecified: Secondary | ICD-10-CM

## 2021-11-09 DIAGNOSIS — I10 Essential (primary) hypertension: Secondary | ICD-10-CM

## 2021-11-09 DIAGNOSIS — R569 Unspecified convulsions: Secondary | ICD-10-CM

## 2021-11-09 DIAGNOSIS — K219 Gastro-esophageal reflux disease without esophagitis: Secondary | ICD-10-CM | POA: Diagnosis not present

## 2021-11-09 LAB — CBC WITH DIFFERENTIAL/PLATELET
Basophils Absolute: 0.1 10*3/uL (ref 0.0–0.1)
Basophils Relative: 1.1 % (ref 0.0–3.0)
Eosinophils Absolute: 0.4 10*3/uL (ref 0.0–0.7)
Eosinophils Relative: 4.1 % (ref 0.0–5.0)
HCT: 37 % (ref 36.0–46.0)
Hemoglobin: 12.2 g/dL (ref 12.0–15.0)
Lymphocytes Relative: 37 % (ref 12.0–46.0)
Lymphs Abs: 3.7 10*3/uL (ref 0.7–4.0)
MCHC: 32.9 g/dL (ref 30.0–36.0)
MCV: 83.2 fl (ref 78.0–100.0)
Monocytes Absolute: 1.2 10*3/uL — ABNORMAL HIGH (ref 0.1–1.0)
Monocytes Relative: 12.3 % — ABNORMAL HIGH (ref 3.0–12.0)
Neutro Abs: 4.6 10*3/uL (ref 1.4–7.7)
Neutrophils Relative %: 45.5 % (ref 43.0–77.0)
Platelets: 377 10*3/uL (ref 150.0–400.0)
RBC: 4.45 Mil/uL (ref 3.87–5.11)
RDW: 15.5 % (ref 11.5–15.5)
WBC: 10 10*3/uL (ref 4.0–10.5)

## 2021-11-09 LAB — COMPREHENSIVE METABOLIC PANEL WITH GFR
ALT: 20 U/L (ref 0–35)
AST: 20 U/L (ref 0–37)
Albumin: 4.4 g/dL (ref 3.5–5.2)
Alkaline Phosphatase: 59 U/L (ref 39–117)
BUN: 25 mg/dL — ABNORMAL HIGH (ref 6–23)
CO2: 28 meq/L (ref 19–32)
Calcium: 9.6 mg/dL (ref 8.4–10.5)
Chloride: 95 meq/L — ABNORMAL LOW (ref 96–112)
Creatinine, Ser: 0.98 mg/dL (ref 0.40–1.20)
GFR: 57.27 mL/min — ABNORMAL LOW
Glucose, Bld: 85 mg/dL (ref 70–99)
Potassium: 4.8 meq/L (ref 3.5–5.1)
Sodium: 129 meq/L — ABNORMAL LOW (ref 135–145)
Total Bilirubin: 0.2 mg/dL (ref 0.2–1.2)
Total Protein: 7.2 g/dL (ref 6.0–8.3)

## 2021-11-09 NOTE — Assessment & Plan Note (Signed)
Stable.  Sees urologist on a regular basis.

## 2021-11-09 NOTE — Assessment & Plan Note (Signed)
Normal colonoscopy in 2019.  Advised to return in 10 years.  We will repeat CBC today.

## 2021-11-09 NOTE — Patient Instructions (Addendum)
Hypertension, Adult High blood pressure (hypertension) is when the force of blood pumping through the arteries is too strong. The arteries are the blood vessels that carry blood from the heart throughout the body. Hypertension forces the heart to work harder to pump blood and may cause arteries to become narrow or stiff. Untreated or uncontrolled hypertension can lead to a heart attack, heart failure, a stroke, kidney disease, and other problems. A blood pressure reading consists of a higher number over a lower number. Ideally, your blood pressure should be below 120/80. The first ("top") number is called the systolic pressure. It is a measure of the pressure in your arteries as your heart beats. The second ("bottom") number is called the diastolic pressure. It is a measure of the pressure in your arteries as the heart relaxes. What are the causes? The exact cause of this condition is not known. There are some conditions that result in high blood pressure. What increases the risk? Certain factors may make you more likely to develop high blood pressure. Some of these risk factors are under your control, including: Smoking. Not getting enough exercise or physical activity. Being overweight. Having too much fat, sugar, calories, or salt (sodium) in your diet. Drinking too much alcohol. Other risk factors include: Having a personal history of heart disease, diabetes, high cholesterol, or kidney disease. Stress. Having a family history of high blood pressure and high cholesterol. Having obstructive sleep apnea. Age. The risk increases with age. What are the signs or symptoms? High blood pressure may not cause symptoms. Very high blood pressure (hypertensive crisis) may cause: Headache. Fast or irregular heartbeats (palpitations). Shortness of breath. Nosebleed. Nausea and vomiting. Vision changes. Severe chest pain, dizziness, and seizures. How is this diagnosed? This condition is diagnosed by  measuring your blood pressure while you are seated, with your arm resting on a flat surface, your legs uncrossed, and your feet flat on the floor. The cuff of the blood pressure monitor will be placed directly against the skin of your upper arm at the level of your heart. Blood pressure should be measured at least twice using the same arm. Certain conditions can cause a difference in blood pressure between your right and left arms. If you have a high blood pressure reading during one visit or you have normal blood pressure with other risk factors, you may be asked to: Return on a different day to have your blood pressure checked again. Monitor your blood pressure at home for 1 week or longer. If you are diagnosed with hypertension, you may have other blood or imaging tests to help your health care provider understand your overall risk for other conditions. How is this treated? This condition is treated by making healthy lifestyle changes, such as eating healthy foods, exercising more, and reducing your alcohol intake. You may be referred for counseling on a healthy diet and physical activity. Your health care provider may prescribe medicine if lifestyle changes are not enough to get your blood pressure under control and if: Your systolic blood pressure is above 130. Your diastolic blood pressure is above 80. Your personal target blood pressure may vary depending on your medical conditions, your age, and other factors. Follow these instructions at home: Eating and drinking  Eat a diet that is high in fiber and potassium, and low in sodium, added sugar, and fat. An example of this eating plan is called the DASH diet. DASH stands for Dietary Approaches to Stop Hypertension. To eat this way: Eat   plenty of fresh fruits and vegetables. Try to fill one half of your plate at each meal with fruits and vegetables. Eat whole grains, such as whole-wheat pasta, brown rice, or whole-grain bread. Fill about one  fourth of your plate with whole grains. Eat or drink low-fat dairy products, such as skim milk or low-fat yogurt. Avoid fatty cuts of meat, processed or cured meats, and poultry with skin. Fill about one fourth of your plate with lean proteins, such as fish, chicken without skin, beans, eggs, or tofu. Avoid pre-made and processed foods. These tend to be higher in sodium, added sugar, and fat. Reduce your daily sodium intake. Many people with hypertension should eat less than 1,500 mg of sodium a day. Do not drink alcohol if: Your health care provider tells you not to drink. You are pregnant, may be pregnant, or are planning to become pregnant. If you drink alcohol: Limit how much you have to: 0-1 drink a day for women. 0-2 drinks a day for men. Know how much alcohol is in your drink. In the [place] one drink equals one 12 oz bottle of beer (355 mL), one 5 oz glass of wine (148 mL), or one 1 oz glass of hard liquor (44 mL). Lifestyle  Work with your health care provider to maintain a healthy body weight or to lose weight. Ask what an ideal weight is for you. Get at least 30 minutes of exercise that causes your heart to beat faster (aerobic exercise) most days of the week. Activities may include walking, swimming, or biking. Include exercise to strengthen your muscles (resistance exercise), such as Pilates or lifting weights, as part of your weekly exercise routine. Try to do these types of exercises for 30 minutes at least 3 days a week. Do not use any products that contain nicotine or tobacco. These products include cigarettes, chewing tobacco, and vaping devices, such as e-cigarettes. If you need help quitting, ask your health care provider. Monitor your blood pressure at home as told by your health care provider. Keep all follow-up visits. This is important. Medicines Take over-the-counter and prescription medicines only as told by your health care provider. Follow directions carefully. Blood  pressure medicines must be taken as prescribed. Do not skip doses of blood pressure medicine. Doing this puts you at risk for problems and can make the medicine less effective. Ask your health care provider about side effects or reactions to medicines that you should watch for. Contact a health care provider if you: Think you are having a reaction to a medicine you are taking. Have headaches that keep coming back (recurring). Feel dizzy. Have swelling in your ankles. Have trouble with your vision. Get help right away if you: Develop a severe headache or confusion. Have unusual weakness or numbness. Feel faint. Have severe pain in your chest or abdomen. Vomit repeatedly. Have trouble breathing. These symptoms may be an emergency. Get help right away. Call 911. Do not wait to see if the symptoms will go away. Do not drive yourself to the hospital. Summary Hypertension is when the force of blood pumping through your arteries is too strong. If this condition is not controlled, it may put you at risk for serious complications. Your personal target blood pressure may vary depending on your medical conditions, your age, and other factors. For most people, a normal blood pressure is less than 120/80. Hypertension is treated with lifestyle changes, medicines, or a combination of both. Lifestyle changes include losing weight, eating a healthy,   low-sodium diet, exercising more, and limiting alcohol. This information is not intended to replace advice given to you by your health care provider. Make sure you discuss any questions you have with your health care provider. Document Revised: 11/16/2020 Document Reviewed: 11/16/2020 [employer] Patient Education  2023 [employer]  

## 2021-11-09 NOTE — Assessment & Plan Note (Addendum)
Well-controlled hypertension. Continue irbesartan 300 mg daily, hydrochlorothiazide 25 mg daily and amlodipine 5 mg daily. Also taking metoprolol tartrate 50 mg twice a day BP Readings from Last 3 Encounters:  11/09/21 138/82  09/10/21 (!) 157/68  09/09/21 (!) 130/58

## 2021-11-09 NOTE — Assessment & Plan Note (Signed)
Secondary to past cerebral aneurysm rupture.  Presently on Keppra 500 mg twice a day

## 2021-11-09 NOTE — Assessment & Plan Note (Signed)
Of recent onset.  We will repeat today. Advised to stay well-hydrated and avoid NSAIDs Has been taking Aleve almost daily.

## 2021-11-09 NOTE — Addendum Note (Signed)
Addended by: Davina Poke on: 11/09/2021 12:12 PM   Modules accepted: Orders

## 2021-11-09 NOTE — Telephone Encounter (Signed)
Called patient to inform her of her lab results

## 2021-11-09 NOTE — Assessment & Plan Note (Signed)
Well-controlled.  Continue omeprazole 20 mg at bedtime

## 2021-11-09 NOTE — Progress Notes (Addendum)
[name] 71 y.o.   Chief Complaint  Patient presents with   New Patient (Initial Visit)    Concern about her blood pressure , pt has light tremor in her right hand     HISTORY OF PRESENT ILLNESS: This is a 71 y.o. female first visit to this office, here to establish care with me. Has several chronic medical conditions including the following: 1.  Hypertension: On irbesartan, hydrochlorothiazide, and amlodipine 2.  History of recurrent UTIs.  Sees urologist on a regular basis 3.  Brain aneurysm which bled in 1996 and was repaired.  Seizures as a result of this. Presently on Keppra daily.  Has not seen neurologist recently.  Today complaining of intermittent mild left hand tremor 4.  Was recently seen in emergency department and blood work showed anemia and increased creatinine with decreased GFR.  Patient was unaware of any chronic kidney disease. 5.  Chronic soft tissue and joint pains.  Takes Tylenol and Aleve almost on a daily basis. Complaints or medical concerns today. BP Readings from Last 3 Encounters:  11/09/21 138/82  09/10/21 (!) 157/68  09/09/21 (!) 130/58     HPI   Prior to Admission medications   Medication Sig Start Date End Date Taking? Authorizing Provider  acetaminophen (TYLENOL) 500 MG tablet Take 1,000 mg by mouth every 6 (six) hours as needed for mild pain.   Yes [provider]  amLODipine (NORVASC) 5 MG tablet Take 5 mg by mouth daily. 06/17/19  Yes [provider]  baclofen (LIORESAL) 10 MG tablet Take 10 mg by mouth at bedtime.   Yes [provider]  cloNIDine (CATAPRES) 0.1 MG tablet TAKE 1 TABLET(0.1 MG) BY MOUTH TWICE DAILY Patient taking differently: Take 0.1 mg by mouth 2 (two) times daily. 01/22/17  Yes Josue Hector, MD  fenofibrate 54 MG tablet Take 54 mg by mouth daily.  03/22/17  Yes [provider]  hydrochlorothiazide (HYDRODIURIL) 25 MG tablet Take 25 mg by mouth daily. 08/18/19  Yes [provider]  irbesartan (AVAPRO) 300 MG tablet Take 300 mg by mouth daily. 11/06/21  Yes [provider]  levETIRAcetam (KEPPRA) 500 MG tablet Take 1 tablet (500 mg total) by mouth 2 (two) times daily. 06/02/10  Yes Judithann Sheen, MD  metoprolol tartrate (LOPRESSOR) 50 MG tablet Take 50 mg by mouth 2 (two) times daily.    Yes [provider]  naproxen sodium (ALEVE) 220 MG tablet Take 440 mg by mouth 2 (two) times daily as needed (pain).   Yes [provider]  omeprazole (PRILOSEC) 20 MG capsule Take 20 mg by mouth at bedtime.    Yes [provider]  traZODone (DESYREL) 100 MG tablet Take 1 tablet (100 mg total) by mouth at bedtime. 07/14/19  Yes Charlynne Cousins, MD    Allergies  Allergen Reactions   Barium-Containing Compounds Nausea And Vomiting   Eggs Or Egg-Derived Products Nausea And Vomiting   Red Dye Nausea And Vomiting    Patient Active Problem List   Diagnosis Date Noted   Adjustment disorder with depressed mood 07/13/2019   Chronic diarrhea 07/07/2019   Depression with suicidal ideation 07/07/2019   Vitamin D deficiency 02/15/2011   Fibromyalgia 02/14/2011   High risk medication use 02/14/2011   Hypertension 01/07/2011   GERD (gastroesophageal reflux disease) 12/30/2010   Grief at loss of child 12/30/2010   Gait instability 11/28/2010   Anxiety 09/01/2010   Nonruptured cerebral aneurysm 07/01/2010   Ulnar neuropathy  06/03/2010   Depression 06/03/2010   Insomnia 06/03/2010   Seizure ([place]) 06/03/2010   Recurrent UTI 06/03/2010   Arthritis 06/03/2010    Past Medical History:  Diagnosis Date   Aneurysm ([place])    brain   Anxiety    Arthritis    Depression    Fibromyalgia    GERD (gastroesophageal reflux disease)    Hyperlipidemia    pt. thought she was placed on medication for cholesterol but does not know for sure.   Hypertension    Insomnia    Seizures ([place])    [phone]    Past Surgical History:  Procedure Laterality  Date   ABDOMINAL HYSTERECTOMY     BREAST CYST EXCISION Right    CEREBRAL ANEURYSM REPAIR     CEREBRAL ANEURYSM REPAIR     clipped    COLONOSCOPY     12+ years ago [place]   POLYPECTOMY      Social History   Socioeconomic History   Marital status: Divorced    Spouse name: Not on file   Number of children: Not on file   Years of education: Not on file   Highest education level: Not on file  Occupational History   Not on file  Tobacco Use   Smoking status: Former    Types: Cigarettes    Quit date: 32    Years since quitting: 38.8   Smokeless tobacco: Never  Substance and Sexual Activity   Alcohol use: No   Drug use: No   Sexual activity: Not Currently  Other Topics Concern   Not on file  Social History Narrative   Not on file   Social Determinants of Health   Financial Resource Strain: Not on file  Food Insecurity: Not on file  Transportation Needs: Not on file  Physical Activity: Not on file  Stress: Not on file  Social Connections: Not on file  Intimate Partner Violence: Not on file    Family History  Problem Relation Age of Onset   Heart disease Mother    Heart disease Father    Cancer Brother    Arthritis Brother    Colon cancer Paternal Grandfather    Esophageal cancer Son    Colon polyps Neg Hx    Rectal cancer Neg Hx    Stomach cancer Neg Hx      Review of Systems  Constitutional: Negative.  Negative for chills and fever.  HENT: Negative.  Negative for congestion and sore throat.   Respiratory: Negative.  Negative for cough and shortness of breath.   Cardiovascular: Negative.  Negative for chest pain and palpitations.  Gastrointestinal: Negative.  Negative for abdominal pain, diarrhea, nausea and vomiting.  Genitourinary: Negative.   Skin: Negative.  Negative for rash.  Neurological: Negative.  Negative for dizziness and headaches.  All other systems reviewed and are negative.   Today's Vitals   11/09/21 1011  BP: 138/82  Pulse: (!) 57   Temp: 97.7 F (36.5 C)  TempSrc: Oral  SpO2: 97%  Weight: 183 lb 6 oz (83.2 kg)  Height: '5\' 6"'$  (1.676 m)   Body mass index is 29.6 kg/m.  Physical Exam Vitals reviewed.  Constitutional:      Appearance: Normal appearance.  HENT:     Head: Normocephalic.     Mouth/Throat:     Mouth: Mucous membranes are moist.     Pharynx: Oropharynx is clear.  Eyes:     Extraocular Movements: Extraocular movements intact.     Pupils: Pupils  are equal, round, and reactive to light.  Cardiovascular:     Rate and Rhythm: Normal rate and regular rhythm.     Pulses: Normal pulses.     Heart sounds: Normal heart sounds.  Pulmonary:     Effort: Pulmonary effort is normal.     Breath sounds: Normal breath sounds.  Skin:    General: Skin is warm and dry.     Capillary Refill: Capillary refill takes less than 2 seconds.  Neurological:     General: No focal deficit present.     Mental Status: She is alert and oriented to person, place, and time.  Psychiatric:        Mood and Affect: Mood normal.        Behavior: Behavior normal.      ASSESSMENT & PLAN: A total of 49 minutes was spent with the patient and counseling/coordination of care regarding preparing for this visit, review of available medical records, establishing care with me, review of multiple chronic medical problems and their management, review of all medications, review of most recent blood work results, prognosis, documentation and need for follow-up.  Problem List Items Addressed This Visit       Cardiovascular and Mediastinum   Hypertension - Primary (Chronic)    Well-controlled hypertension. Continue irbesartan 300 mg daily, hydrochlorothiazide 25 mg daily and amlodipine 5 mg daily. Also taking metoprolol tartrate 50 mg twice a day BP Readings from Last 3 Encounters:  11/09/21 138/82  09/10/21 (!) 157/68  09/09/21 (!) 130/58         Relevant Medications   irbesartan (AVAPRO) 300 MG tablet     Digestive   GERD  (gastroesophageal reflux disease)    Well-controlled.  Continue omeprazole 20 mg at bedtime        Genitourinary   Recurrent UTI    Stable.  Sees urologist on a regular basis.        Other   Seizure ([place])    Secondary to past cerebral aneurysm rupture.  Presently on Keppra 500 mg twice a day      Relevant Orders   Ambulatory referral to Neurology   Abnormal kidney function    Of recent onset.  We will repeat today. Advised to stay well-hydrated and avoid NSAIDs Has been taking Aleve almost daily.      Relevant Orders   Comprehensive metabolic panel   Anemia    Normal colonoscopy in 2019.  Advised to return in 10 years.  We will repeat CBC today.      Relevant Orders   CBC with Differential/Platelet   Other Visit Diagnoses     Need for vaccination       Relevant Orders   Flu Vaccine QUAD High Dose(Fluad) (Completed)   History of cerebral aneurysm       Relevant Orders   Ambulatory referral to Neurology   Encounter to establish care       Tremor of left hand       Relevant Orders   Ambulatory referral to Neurology        Patient Instructions  Hypertension, Adult High blood pressure (hypertension) is when the force of blood pumping through the arteries is too strong. The arteries are the blood vessels that carry blood from the heart throughout the body. Hypertension forces the heart to work harder to pump blood and may cause arteries to become narrow or stiff. Untreated or uncontrolled hypertension can lead to a heart attack, heart failure, a stroke, kidney disease,  and other problems. A blood pressure reading consists of a higher number over a lower number. Ideally, your blood pressure should be below 120/80. The first ("top") number is called the systolic pressure. It is a measure of the pressure in your arteries as your heart beats. The second ("bottom") number is called the diastolic pressure. It is a measure of the pressure in your arteries as the heart  relaxes. What are the causes? The exact cause of this condition is not known. There are some conditions that result in high blood pressure. What increases the risk? Certain factors may make you more likely to develop high blood pressure. Some of these risk factors are under your control, including: Smoking. Not getting enough exercise or physical activity. Being overweight. Having too much fat, sugar, calories, or salt (sodium) in your diet. Drinking too much alcohol. Other risk factors include: Having a personal history of heart disease, diabetes, high cholesterol, or kidney disease. Stress. Having a family history of high blood pressure and high cholesterol. Having obstructive sleep apnea. Age. The risk increases with age. What are the signs or symptoms? High blood pressure may not cause symptoms. Very high blood pressure (hypertensive crisis) may cause: Headache. Fast or irregular heartbeats (palpitations). Shortness of breath. Nosebleed. Nausea and vomiting. Vision changes. Severe chest pain, dizziness, and seizures. How is this diagnosed? This condition is diagnosed by measuring your blood pressure while you are seated, with your arm resting on a flat surface, your legs uncrossed, and your feet flat on the floor. The cuff of the blood pressure monitor will be placed directly against the skin of your upper arm at the level of your heart. Blood pressure should be measured at least twice using the same arm. Certain conditions can cause a difference in blood pressure between your right and left arms. If you have a high blood pressure reading during one visit or you have normal blood pressure with other risk factors, you may be asked to: Return on a different day to have your blood pressure checked again. Monitor your blood pressure at home for 1 week or longer. If you are diagnosed with hypertension, you may have other blood or imaging tests to help your health care provider understand  your overall risk for other conditions. How is this treated? This condition is treated by making healthy lifestyle changes, such as eating healthy foods, exercising more, and reducing your alcohol intake. You may be referred for counseling on a healthy diet and physical activity. Your health care provider may prescribe medicine if lifestyle changes are not enough to get your blood pressure under control and if: Your systolic blood pressure is above 130. Your diastolic blood pressure is above 80. Your personal target blood pressure may vary depending on your medical conditions, your age, and other factors. Follow these instructions at home: Eating and drinking  Eat a diet that is high in fiber and potassium, and low in sodium, added sugar, and fat. An example of this eating plan is called the DASH diet. DASH stands for Dietary Approaches to Stop Hypertension. To eat this way: Eat plenty of fresh fruits and vegetables. Try to fill one half of your plate at each meal with fruits and vegetables. Eat whole grains, such as whole-wheat pasta, brown rice, or whole-grain bread. Fill about one fourth of your plate with whole grains. Eat or drink low-fat dairy products, such as skim milk or low-fat yogurt. Avoid fatty cuts of meat, processed or cured meats, and poultry with  skin. Fill about one fourth of your plate with lean proteins, such as fish, chicken without skin, beans, eggs, or tofu. Avoid pre-made and processed foods. These tend to be higher in sodium, added sugar, and fat. Reduce your daily sodium intake. Many people with hypertension should eat less than 1,500 mg of sodium a day. Do not drink alcohol if: Your health care provider tells you not to drink. You are pregnant, may be pregnant, or are planning to become pregnant. If you drink alcohol: Limit how much you have to: 0-1 drink a day for women. 0-2 drinks a day for men. Know how much alcohol is in your drink. In the [place] one drink equals  one 12 oz bottle of beer (355 mL), one 5 oz glass of wine (148 mL), or one 1 oz glass of hard liquor (44 mL). Lifestyle  Work with your health care provider to maintain a healthy body weight or to lose weight. Ask what an ideal weight is for you. Get at least 30 minutes of exercise that causes your heart to beat faster (aerobic exercise) most days of the week. Activities may include walking, swimming, or biking. Include exercise to strengthen your muscles (resistance exercise), such as Pilates or lifting weights, as part of your weekly exercise routine. Try to do these types of exercises for 30 minutes at least 3 days a week. Do not use any products that contain nicotine or tobacco. These products include cigarettes, chewing tobacco, and vaping devices, such as e-cigarettes. If you need help quitting, ask your health care provider. Monitor your blood pressure at home as told by your health care provider. Keep all follow-up visits. This is important. Medicines Take over-the-counter and prescription medicines only as told by your health care provider. Follow directions carefully. Blood pressure medicines must be taken as prescribed. Do not skip doses of blood pressure medicine. Doing this puts you at risk for problems and can make the medicine less effective. Ask your health care provider about side effects or reactions to medicines that you should watch for. Contact a health care provider if you: Think you are having a reaction to a medicine you are taking. Have headaches that keep coming back (recurring). Feel dizzy. Have swelling in your ankles. Have trouble with your vision. Get help right away if you: Develop a severe headache or confusion. Have unusual weakness or numbness. Feel faint. Have severe pain in your chest or abdomen. Vomit repeatedly. Have trouble breathing. These symptoms may be an emergency. Get help right away. Call 911. Do not wait to see if the symptoms will go  away. Do not drive yourself to the hospital. Summary Hypertension is when the force of blood pumping through your arteries is too strong. If this condition is not controlled, it may put you at risk for serious complications. Your personal target blood pressure may vary depending on your medical conditions, your age, and other factors. For most people, a normal blood pressure is less than 120/80. Hypertension is treated with lifestyle changes, medicines, or a combination of both. Lifestyle changes include losing weight, eating a healthy, low-sodium diet, exercising more, and limiting alcohol. This information is not intended to replace advice given to you by your health care provider. Make sure you discuss any questions you have with your health care provider. Document Revised: 11/16/2020 Document Reviewed: 11/16/2020 Elsevier Patient Education  [place], MD [facility]

## 2021-11-09 NOTE — Telephone Encounter (Signed)
PT calls back with an FYI for Dr.Sagardia and staff regarding their sodium levels. PT stated she has tested very low on her sodium levels for a long time and that this had been a normal thing for her, but will be taking the new dosage of her cholesterol medication that was recommended to her by Dr.Sagardia.

## 2021-11-09 NOTE — Telephone Encounter (Signed)
PT calls today post visit in regards to their lab results. PT would like to speak with Dr.Sagardia or staff when possible to go over these lab results.  CB: [phone]

## 2021-11-11 ENCOUNTER — Encounter: Payer: Self-pay | Admitting: Neurology

## 2021-11-11 ENCOUNTER — Ambulatory Visit: Payer: Medicare HMO | Admitting: Internal Medicine

## 2021-11-22 IMAGING — CT CT RENAL STONE PROTOCOL
2 of 4 series · 16 of 46 positions shown, 18 images · non-contrast
Comparison: Contrast enhanced abdominopelvic CT 07/07/2019.

CLINICAL DATA: Hematuria for 1 week. Recent urinary tract
infection.

EXAM:
CT ABDOMEN AND PELVIS WITHOUT CONTRAST
TECHNIQUE: Multidetector CT imaging of the abdomen and pelvis was performed
following the standard protocol without IV contrast.

[Series 2: axial st · axial · 0.72mm/px · z∈[-496,-96]mm · 13 of 90 slices shown, 15 images]
[im 5/90  soft-tissue]
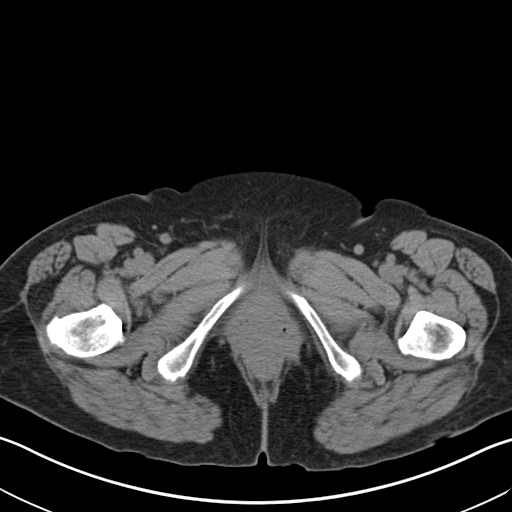
[im 5/90  bone]
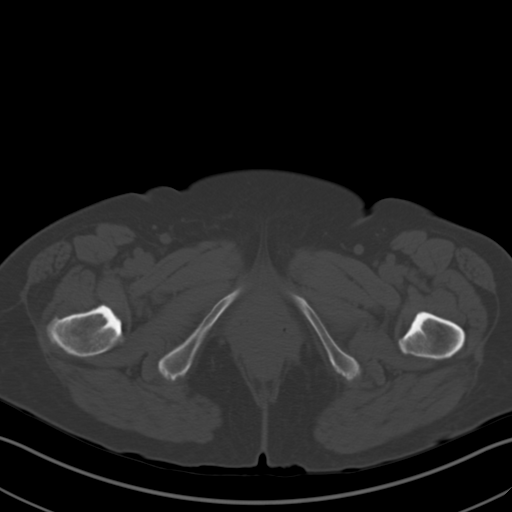
[im 10/90  soft-tissue]
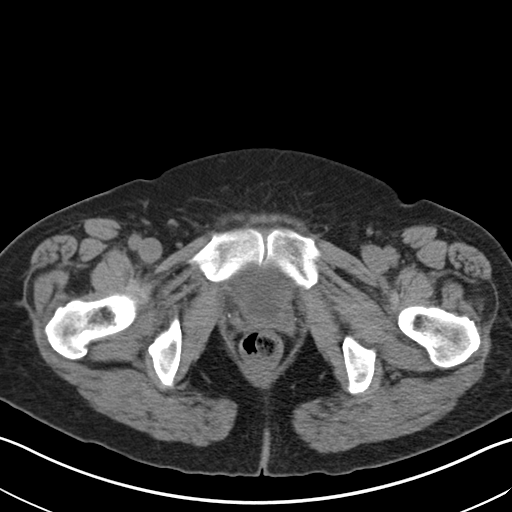
[im 20/90  soft-tissue]
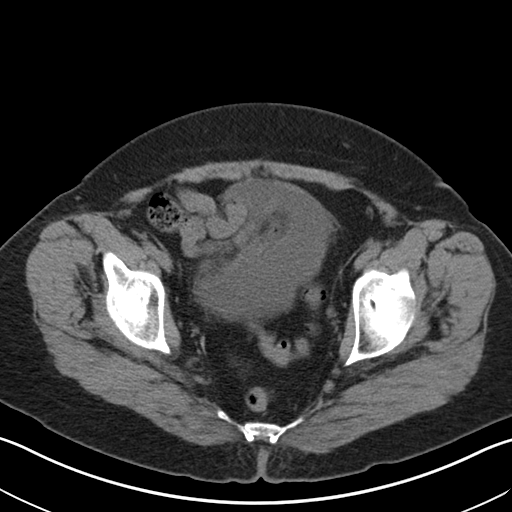
[im 25/90  soft-tissue]
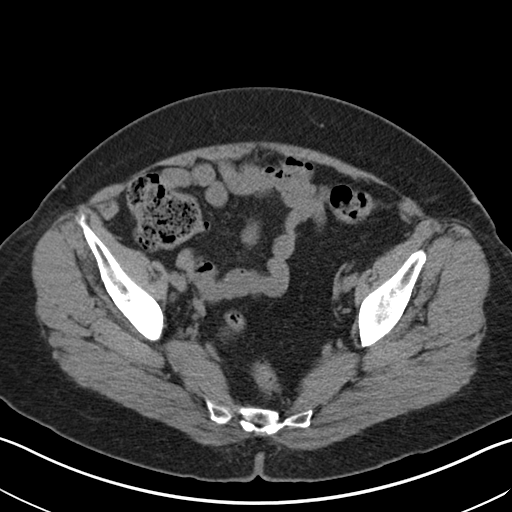
[im 30/90  soft-tissue]
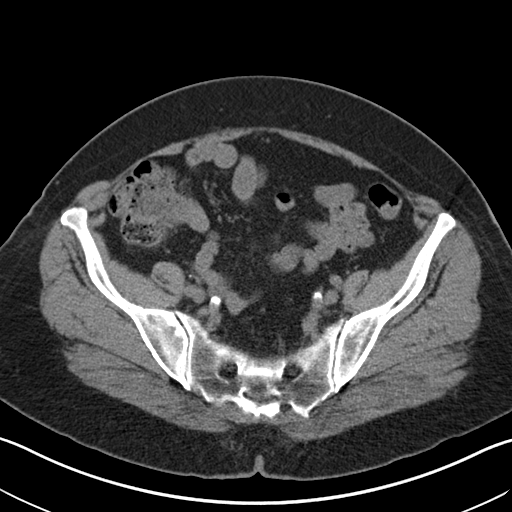
[im 40/90  soft-tissue]
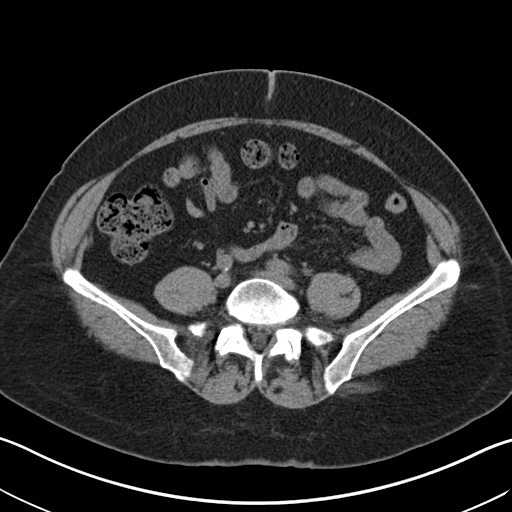
[im 45/90  soft-tissue]
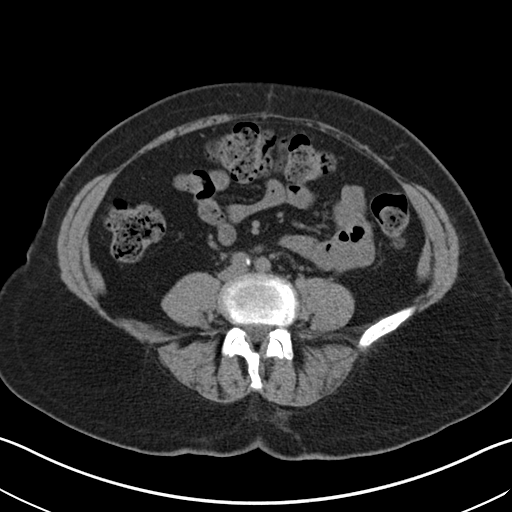
[im 50/90  soft-tissue]
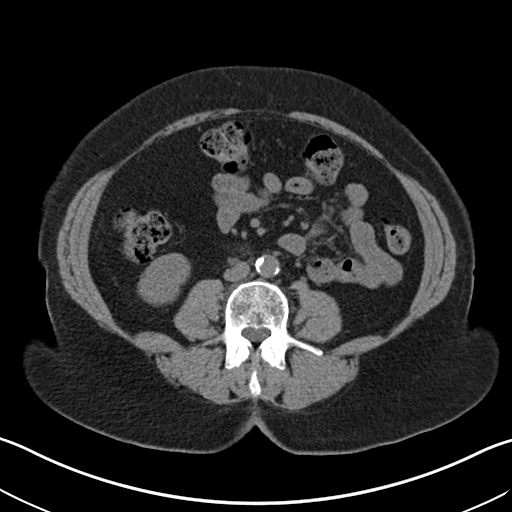
[im 60/90  soft-tissue]
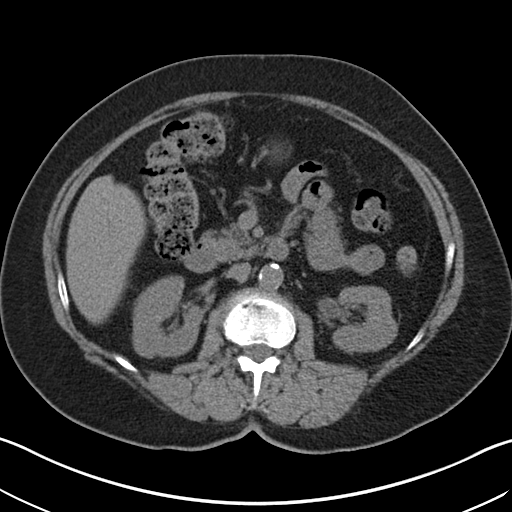
[im 60/90  bone]
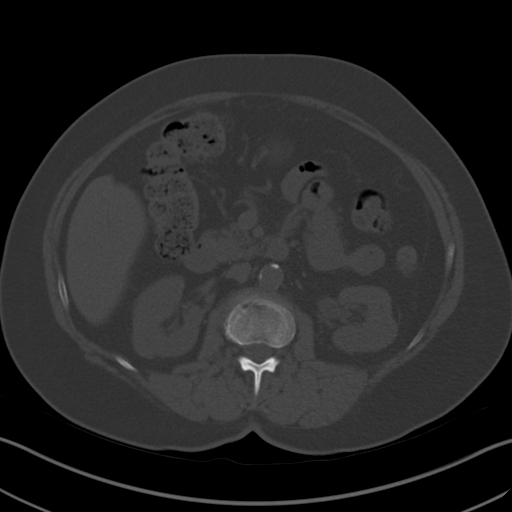
[im 65/90  soft-tissue]
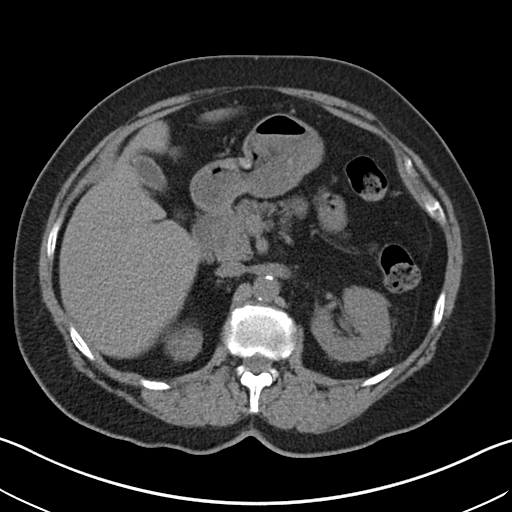
[im 70/90  soft-tissue]
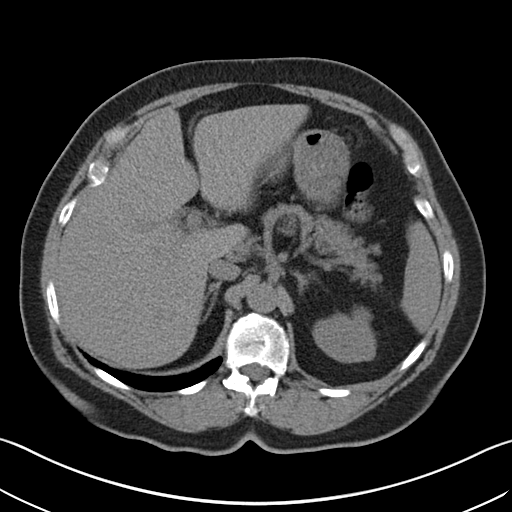
[im 80/90  soft-tissue]
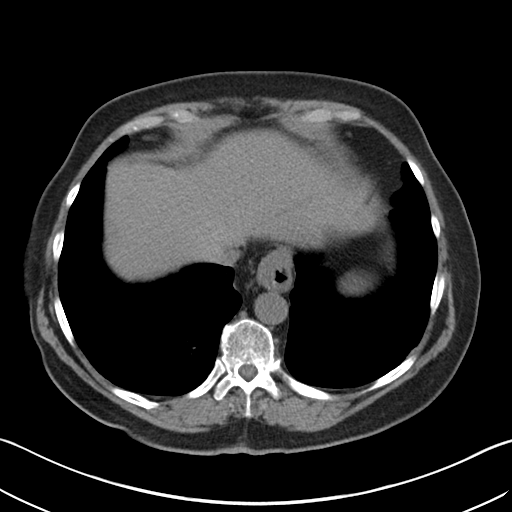
[im 85/90  soft-tissue]
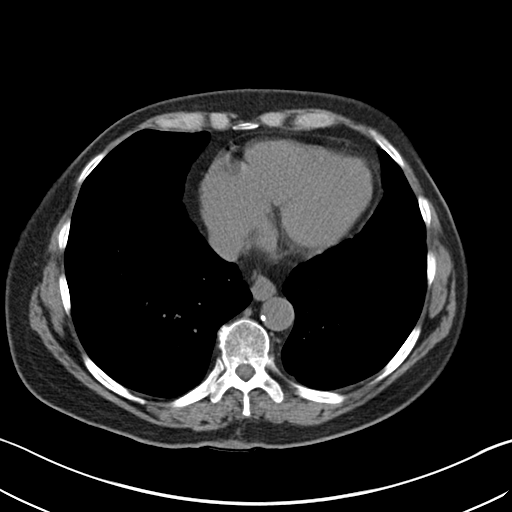

[Series 4: coronal · coronal · 0.79mm/px · 3 of 150 slices shown]
[im 50/150  soft-tissue]
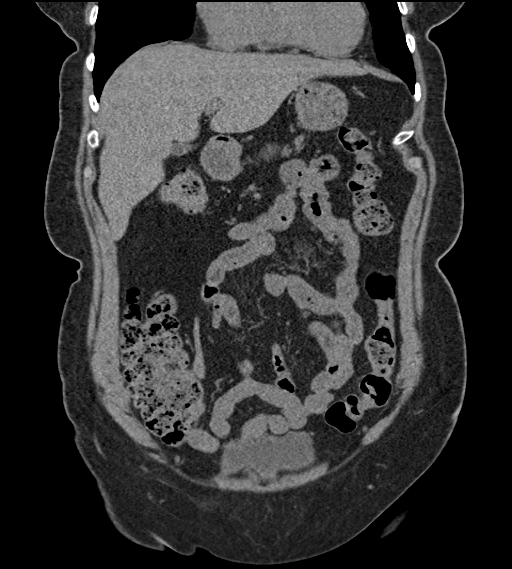
[im 67/150  soft-tissue]
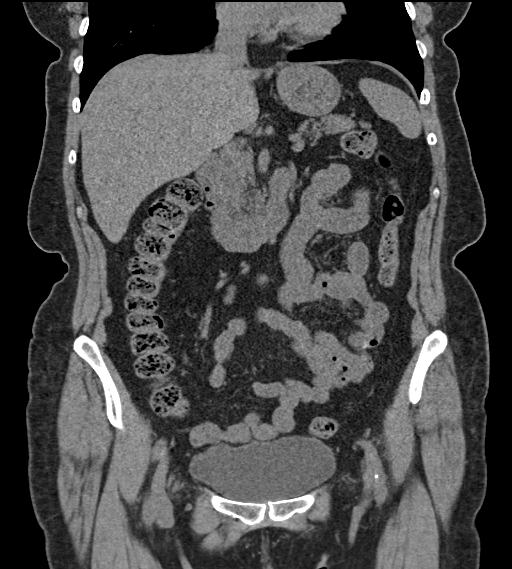
[im 83/150  soft-tissue]
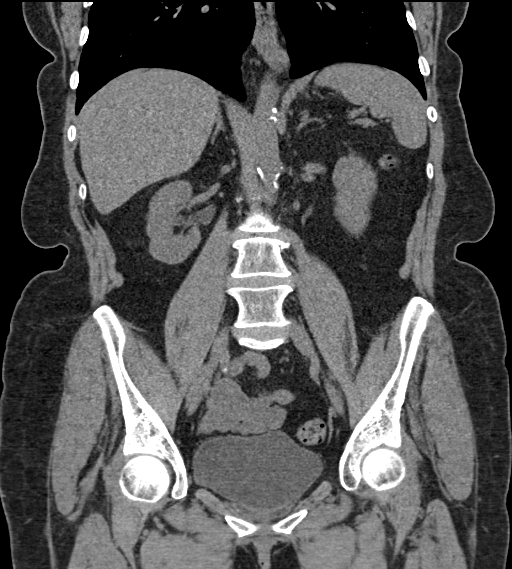

[16 of 46 positions shown; findings below may reference images not displayed]

FINDINGS: Lower chest: Mild chronic scarring and central airway thickening at
both lung bases. There is a small hiatal hernia and mild aortic
atherosclerosis.

Hepatobiliary: The liver appears unremarkable as imaged in the
noncontrast state. The gallbladder is incompletely distended. No
evidence of gallstones, gallbladder wall thickening or biliary
dilatation.

Pancreas: Unremarkable. No pancreatic ductal dilatation or
surrounding inflammatory changes.

Spleen: Normal in size without focal abnormality.

Adrenals/Urinary Tract: Both adrenal glands appear normal. The
kidneys appear unremarkable as imaged in the noncontrast state.
There is no evidence of urinary tract calculus, hydronephrosis or
perinephric soft tissue stranding. The bladder is mildly distended,
although improved from the previous study. No evidence of bladder
wall thickening or surrounding inflammation.

Stomach/Bowel: No enteric contrast administered. Small hiatal
hernia. The stomach appears unremarkable for its degree of
distension. No evidence of bowel wall thickening, distention or
surrounding inflammatory change. The appendix appears normal. There
is moderate stool throughout the colon.

Vascular/Lymphatic: There are no enlarged abdominal or pelvic lymph
nodes. Mild aortic and branch vessel atherosclerosis.

Reproductive: Hysterectomy. No adnexal mass. Possible mild pelvic
floor laxity.

Other: Stable tiny umbilical hernia containing only fat. No ascites.

Musculoskeletal: No acute or significant osseous findings. Mild
lumbar spondylosis with stable Schmorl's node formation involving
the superior endplate of L1.
IMPRESSION: 1. No cause for hematuria identified on noncontrast imaging. No
evidence of urinary tract calculus, hydronephrosis or perinephric
soft tissue stranding. If the patient has persistent unexplained
hematuria, further evaluation may be warranted.
2. No acute abdominopelvic findings. The inflammatory changes
previously noted in the left upper quadrant have resolved.
3.  Aortic Atherosclerosis ([id number]-[id number]).

## 2021-12-02 DIAGNOSIS — N3021 Other chronic cystitis with hematuria: Secondary | ICD-10-CM | POA: Diagnosis not present

## 2021-12-02 DIAGNOSIS — R8271 Bacteriuria: Secondary | ICD-10-CM | POA: Diagnosis not present

## 2021-12-22 ENCOUNTER — Ambulatory Visit: Payer: Medicare HMO | Admitting: Neurology

## 2022-01-09 ENCOUNTER — Other Ambulatory Visit: Payer: Self-pay | Admitting: Emergency Medicine

## 2022-01-09 ENCOUNTER — Telehealth: Payer: Self-pay | Admitting: Emergency Medicine

## 2022-01-09 DIAGNOSIS — R079 Chest pain, unspecified: Secondary | ICD-10-CM

## 2022-01-09 MED ORDER — CLONIDINE HCL 0.1 MG PO TABS: 0.1000 mg | ORAL_TABLET | Freq: Two times a day (BID) | ORAL | 2 refills | Status: DC

## 2022-01-09 MED ORDER — LEVETIRACETAM 500 MG PO TABS
500.0000 mg | ORAL_TABLET | Freq: Two times a day (BID) | ORAL | 3 refills | Status: DC
Start: 2022-01-09 — End: 2022-03-07

## 2022-01-09 NOTE — Telephone Encounter (Signed)
Caller & Relationship to patient:self   Call back number:[phone]   Date of last office visit: 11-09-2021   Date of next office visit:02/09/2022   Medication(s) to be refilled:  Kepra and Clonidine        Preferred Pharmacy:  [facility] on [address]

## 2022-01-09 NOTE — Telephone Encounter (Signed)
New prescriptions sent to pharmacy of record.  Thanks.

## 2022-01-18 ENCOUNTER — Encounter: Payer: Self-pay | Admitting: Adult Health

## 2022-01-20 ENCOUNTER — Telehealth: Payer: Self-pay

## 2022-01-20 NOTE — Telephone Encounter (Signed)
Pt has stated she has had a very bad bout with diarrhea x2 days and is unable to leave her hoe due to the severity of the diarrhea episodes. She has been to the bathroom all night long and all morning. She states she had bloating in her upper abdomen a few weeks now and it is still a concern and issue. She states she has mentioned this before to provider as well.  **Pt is wanting to know what can she do to help her with the above issue. She states will come in if she needs to.  SH asked that she gets a call with instructions on what she needs to do please.

## 2022-01-20 NOTE — Telephone Encounter (Signed)
Called patient and left message for patient to call office in reference to provider recommendation. If patient calls back please inform patient that an OV is needed. No appointments today. If she still needs to be seen she will need to be evaluated at an urgent care or ED.

## 2022-01-24 ENCOUNTER — Encounter: Payer: Self-pay | Admitting: Internal Medicine

## 2022-01-24 NOTE — Progress Notes (Unsigned)
    Subjective:    Patient ID: [name], female    DOB: [birth year], 72 y.o.   MRN: [id number]      HPI [name] is here for No chief complaint on file.    GI issues.      Medications and allergies reviewed with patient and updated if appropriate.  Current Outpatient Medications on File Prior to Visit  Medication Sig Dispense Refill   acetaminophen (TYLENOL) 500 MG tablet Take 1,000 mg by mouth every 6 (six) hours as needed for mild pain.     amLODipine (NORVASC) 5 MG tablet Take 5 mg by mouth daily.     baclofen (LIORESAL) 10 MG tablet Take 10 mg by mouth at bedtime.     cloNIDine (CATAPRES) 0.1 MG tablet Take 1 tablet (0.1 mg total) by mouth 2 (two) times daily. 120 tablet 2   fenofibrate 54 MG tablet Take 54 mg by mouth daily.   5   hydrochlorothiazide (HYDRODIURIL) 25 MG tablet Take 25 mg by mouth daily.     irbesartan (AVAPRO) 300 MG tablet Take 300 mg by mouth daily.     levETIRAcetam (KEPPRA) 500 MG tablet Take 1 tablet (500 mg total) by mouth 2 (two) times daily. 60 tablet 3   metoprolol tartrate (LOPRESSOR) 50 MG tablet Take 50 mg by mouth 2 (two) times daily.      naproxen sodium (ALEVE) 220 MG tablet Take 440 mg by mouth 2 (two) times daily as needed (pain).     omeprazole (PRILOSEC) 20 MG capsule Take 20 mg by mouth at bedtime.      traZODone (DESYREL) 100 MG tablet Take 1 tablet (100 mg total) by mouth at bedtime. 10 tablet 0   Current Facility-Administered Medications on File Prior to Visit  Medication Dose Route Frequency Provider Last Rate Last Admin   0.9 %  sodium chloride infusion  500 mL Intravenous Once Milus Banister, MD       regadenoson Carlton Adam) injection SOLN 0.4 mg  0.4 mg Intravenous Once Hilty, Nadean Corwin, MD        Review of Systems     Objective:  There were no vitals filed for this visit. BP Readings from Last 3 Encounters:  11/09/21 138/82  09/10/21 (!) 157/68  09/09/21 (!) 130/58   Wt Readings from Last 3 Encounters:  11/09/21 183  lb 6 oz (83.2 kg)  09/09/21 173 lb 15.1 oz (78.9 kg)  08/31/20 174 lb (78.9 kg)   There is no height or weight on file to calculate BMI.    Physical Exam         Assessment & Plan:    See Problem List for Assessment and Plan of chronic medical problems.

## 2022-01-25 ENCOUNTER — Ambulatory Visit (INDEPENDENT_AMBULATORY_CARE_PROVIDER_SITE_OTHER): Payer: Medicare HMO | Admitting: Internal Medicine

## 2022-01-25 ENCOUNTER — Other Ambulatory Visit: Payer: Self-pay

## 2022-01-25 VITALS — BP 146/70 | HR 80 | Temp 98.0°F | Ht 66.0 in | Wt 179.0 lb

## 2022-01-25 DIAGNOSIS — R300 Dysuria: Secondary | ICD-10-CM

## 2022-01-25 DIAGNOSIS — K644 Residual hemorrhoidal skin tags: Secondary | ICD-10-CM

## 2022-01-25 DIAGNOSIS — R195 Other fecal abnormalities: Secondary | ICD-10-CM

## 2022-01-25 DIAGNOSIS — N3001 Acute cystitis with hematuria: Secondary | ICD-10-CM | POA: Diagnosis not present

## 2022-01-25 DIAGNOSIS — N3000 Acute cystitis without hematuria: Secondary | ICD-10-CM | POA: Insufficient documentation

## 2022-01-25 LAB — POC URINALSYSI DIPSTICK (AUTOMATED)
Bilirubin, UA: NEGATIVE
Glucose, UA: NEGATIVE
Ketones, UA: NEGATIVE
Nitrite, UA: NEGATIVE
Protein, UA: NEGATIVE
Spec Grav, UA: 1.015 (ref 1.010–1.025)
Urobilinogen, UA: 0.2 E.U./dL
pH, UA: 6 (ref 5.0–8.0)

## 2022-01-25 MED ORDER — OMEPRAZOLE 40 MG PO CPDR: 40.0000 mg | DELAYED_RELEASE_CAPSULE | Freq: Every day | ORAL | 3 refills | Status: DC

## 2022-01-25 MED ORDER — AMOXICILLIN-POT CLAVULANATE 875-125 MG PO TABS: 1.0000 | ORAL_TABLET | Freq: Two times a day (BID) | ORAL | 0 refills | Status: AC

## 2022-01-25 MED ORDER — HYDROCORTISONE 2.5 % EX CREA
TOPICAL_CREAM | Freq: Two times a day (BID) | CUTANEOUS | 0 refills | Status: DC | PRN
Start: 2022-01-25 — End: 2022-07-31

## 2022-01-25 NOTE — Patient Instructions (Addendum)
    Have blood work today.    Medications changes include :   Augmentin twice a day for one week.   Increase omeprazole to 40 mg daily to better control your heartburn.  Hydrocortisone cream 2.5% twice daily as needed for hemorrhoids     You can try imodium as needed.      Return if symptoms worsen or fail to improve.

## 2022-01-26 LAB — COMPREHENSIVE METABOLIC PANEL
ALT: 32 U/L (ref 0–35)
AST: 26 U/L (ref 0–37)
Albumin: 4.5 g/dL (ref 3.5–5.2)
Alkaline Phosphatase: 73 U/L (ref 39–117)
BUN: 25 mg/dL — ABNORMAL HIGH (ref 6–23)
CO2: 30 mEq/L (ref 19–32)
Calcium: 9.7 mg/dL (ref 8.4–10.5)
Chloride: 98 mEq/L (ref 96–112)
Creatinine, Ser: 1.09 mg/dL (ref 0.40–1.20)
GFR: 50.33 mL/min — ABNORMAL LOW (ref >60.00)
Glucose, Bld: 107 mg/dL — ABNORMAL HIGH (ref 70–99)
Potassium: 4.6 mEq/L (ref 3.5–5.1)
Sodium: 135 mEq/L (ref 135–145)
Total Bilirubin: 0.2 mg/dL (ref 0.2–1.2)
Total Protein: 7.3 g/dL (ref 6.0–8.3)

## 2022-01-26 LAB — CBC WITH DIFFERENTIAL/PLATELET
Basophils Absolute: 0.1 10*3/uL (ref 0.0–0.1)
Basophils Relative: 0.4 % (ref 0.0–3.0)
Eosinophils Absolute: 0.3 10*3/uL (ref 0.0–0.7)
Eosinophils Relative: 2 % (ref 0.0–5.0)
HCT: 39.9 % (ref 36.0–46.0)
Hemoglobin: 13.1 g/dL (ref 12.0–15.0)
Lymphocytes Relative: 23.2 % (ref 12.0–46.0)
Lymphs Abs: 2.9 10*3/uL (ref 0.7–4.0)
MCHC: 32.9 g/dL (ref 30.0–36.0)
MCV: 82.4 fl (ref 78.0–100.0)
Monocytes Absolute: 0.8 10*3/uL (ref 0.1–1.0)
Monocytes Relative: 6.1 % (ref 3.0–12.0)
Neutro Abs: 8.5 10*3/uL — ABNORMAL HIGH (ref 1.4–7.7)
Neutrophils Relative %: 68.3 % (ref 43.0–77.0)
Platelets: 420 10*3/uL — ABNORMAL HIGH (ref 150.0–400.0)
RBC: 4.84 Mil/uL (ref 3.87–5.11)
RDW: 15.2 % (ref 11.5–15.5)
WBC: 12.4 10*3/uL — ABNORMAL HIGH (ref 4.0–10.5)

## 2022-01-27 LAB — CULTURE, URINE COMPREHENSIVE

## 2022-01-27 MED ORDER — SULFAMETHOXAZOLE-TRIMETHOPRIM 800-160 MG PO TABS: 1.0000 | ORAL_TABLET | Freq: Two times a day (BID) | ORAL | 0 refills | Status: AC

## 2022-01-27 NOTE — Addendum Note (Signed)
Addended by: Binnie Rail on: 01/27/2022 02:04 PM   Modules accepted: Orders

## 2022-01-30 ENCOUNTER — Other Ambulatory Visit: Payer: Self-pay | Admitting: Adult Health

## 2022-01-30 DIAGNOSIS — R109 Unspecified abdominal pain: Secondary | ICD-10-CM

## 2022-02-03 NOTE — Progress Notes (Deleted)
Assessment/Plan:   ***2.  Remote history of ruptured right MCA aneurysm, 1996, status post clipping  -This resulted in seizure, but patient, and is now maintained on Keppra  Subjective:   [name] was seen today in the movement disorders clinic for neurologic consultation at the request of Horald Pollen, *.  The consultation is for the evaluation of left hand tremor.  Medical records made available to me are reviewed.  Patient is a 72 year old female with a history of ruptured cerebral aneurysm in 1996 with resultant seizure, now on Keppra, who presents today with concerns about left hand tremor.  Tremor: {yes no:314532}   How long has it been going on? ***  At rest or with activation?  ***  When is it noted the most?  ***  Fam hx of tremor?  {yes [id number]  Located where?  ***  Affected by caffeine:  {yes no:314532}  Affected by alcohol:  {yes no:314532}  Affected by stress:  {yes no:314532}  Affected by fatigue:  {yes no:314532}  Spills soup if on spoon:  {yes no:314532}  Spills glass of liquid if full:  {yes no:314532}  Affects ADL's (tying shoes, brushing teeth, etc):  {yes no:314532}  Tremor inducing meds:  {yes no:314532}  Other Specific Symptoms:  Voice: *** Sleep: ***  Vivid Dreams:  {yes no:314532}  Acting out dreams:  {yes no:314532} Wet Pillows: {yes no:314532} Postural symptoms:  {yes no:314532}  Falls?  {yes no:314532} Bradykinesia symptoms: {parkinson brady:18041} Loss of smell:  {yes no:314532} Loss of taste:  {yes no:314532} Urinary Incontinence:  {yes no:314532} Difficulty Swallowing:  {yes no:314532} Handwriting, micrographia: {yes no:314532} Trouble with ADL's:  {yes no:314532}  Trouble buttoning clothing: {yes no:314532} Depression:  {yes no:314532} Memory changes:  {yes no:314532} Hallucinations:  {yes no:314532}  visual distortions: {yes no:314532} N/V:  {yes no:314532} Lightheaded:  {yes no:314532}  Syncope: {yes  no:314532} Diplopia:  {yes no:314532} Dyskinesia:  {yes no:314532}  Last neuroimaging that I have was from February, 2020 and noted clipping of a right MCA aneurysm, but was otherwise unremarkable.  PREVIOUS MEDICATIONS: {Parkinson's RX:18200}  ALLERGIES:   Allergies  Allergen Reactions   Barium-Containing Compounds Nausea And Vomiting   Eggs Or Egg-Derived Products Nausea And Vomiting   Red Dye Nausea And Vomiting    CURRENT MEDICATIONS:  Current Outpatient Medications  Medication Instructions   acetaminophen (TYLENOL) 1,000 mg, Oral, Every 6 hours PRN   amLODipine (NORVASC) 5 mg, Oral, Daily   baclofen (LIORESAL) 10 mg, Oral, Daily at bedtime   cloNIDine (CATAPRES) 0.1 mg, Oral, 2 times daily   fenofibrate 54 mg, Oral, Daily   hydrochlorothiazide (HYDRODIURIL) 25 mg, Oral, Daily   hydrocortisone 2.5 % cream Topical, 2 times daily PRN, Use up to 14 days   irbesartan (AVAPRO) 300 mg, Oral, Daily   levETIRAcetam (KEPPRA) 500 mg, Oral, 2 times daily   metoprolol tartrate (LOPRESSOR) 50 mg, Oral, 2 times daily   naproxen sodium (ALEVE) 440 mg, Oral, 2 times daily PRN   omeprazole (PRILOSEC) 40 mg, Oral, Daily   traZODone (DESYREL) 100 mg, Oral, Daily at bedtime    Objective:   PHYSICAL EXAMINATION:    VITALS:  There were no vitals filed for this visit.  GEN:  The patient appears stated age and is in NAD. HEENT:  Normocephalic, atraumatic.  The mucous membranes are moist. The superficial temporal arteries are without ropiness or tenderness. CV:  RRR Lungs:  CTAB Neck/HEME:  There are no carotid bruits bilaterally.  Neurological examination:  Orientation: The patient is alert and oriented x3.  Cranial nerves: There is good facial symmetry.  Extraocular muscles are intact. The visual fields are full to confrontational testing. The speech is fluent and clear. Soft palate rises symmetrically and there is no tongue deviation. Hearing is intact to conversational  tone. Sensation: Sensation is intact to light touch throughout (facial, trunk, extremities). Vibration is intact at the bilateral big toe. There is no extinction with double simultaneous stimulation.  Motor: Strength is 5/5 in the bilateral upper and lower extremities.   Shoulder shrug is equal and symmetric.  There is no pronator drift. Deep tendon reflexes: Deep tendon reflexes are 2/4 at the bilateral biceps, triceps, brachioradialis, patella and achilles. Plantar responses are downgoing bilaterally.  Movement examination: Tone: There is ***tone in the bilateral upper extremities.  The tone in the lower extremities is ***.  Abnormal movements: *** Coordination:  There is *** decremation with RAM's, *** Gait and Station: The patient has *** difficulty arising out of a deep-seated chair without the use of the hands. The patient's stride length is ***.  The patient has a *** pull test.     I have reviewed and interpreted the following labs independently   Chemistry      Component Value Date/Time   NA 135 01/25/2022 1629   K 4.6 01/25/2022 1629   CL 98 01/25/2022 1629   CO2 30 01/25/2022 1629   BUN 25 (H) 01/25/2022 1629   CREATININE 1.09 01/25/2022 1629   CREATININE 0.77 06/07/2010 0920      Component Value Date/Time   CALCIUM 9.7 01/25/2022 1629   ALKPHOS 73 01/25/2022 1629   AST 26 01/25/2022 1629   ALT 32 01/25/2022 1629   BILITOT 0.2 01/25/2022 1629      Lab Results  Component Value Date   TSH 1.418 07/08/2019   Lab Results  Component Value Date   WBC 12.4 (H) 01/25/2022   HGB 13.1 01/25/2022   HCT 39.9 01/25/2022   MCV 82.4 01/25/2022   PLT 420.0 (H) 01/25/2022      Total time spent on today's visit was ***greater than 60 minutes, including both face-to-face time and nonface-to-face time.  Time included that spent on review of records (prior notes available to me/labs/imaging if pertinent), discussing treatment and goals, answering patient's questions and  coordinating care.  Cc:  Horald Pollen, MD

## 2022-02-06 ENCOUNTER — Ambulatory Visit: Payer: Medicare HMO | Admitting: Neurology

## 2022-02-09 ENCOUNTER — Telehealth: Payer: Self-pay | Admitting: Emergency Medicine

## 2022-02-09 ENCOUNTER — Other Ambulatory Visit: Payer: Self-pay

## 2022-02-09 ENCOUNTER — Ambulatory Visit: Payer: Medicare HMO | Admitting: Emergency Medicine

## 2022-02-09 MED ORDER — BACLOFEN 10 MG PO TABS: 10.0000 mg | ORAL_TABLET | Freq: Every day | ORAL | 0 refills | Status: DC

## 2022-02-09 MED ORDER — METOPROLOL TARTRATE 50 MG PO TABS: 50.0000 mg | ORAL_TABLET | Freq: Two times a day (BID) | ORAL | 0 refills | Status: DC

## 2022-02-09 NOTE — Telephone Encounter (Signed)
Refill has been sent.  °

## 2022-02-09 NOTE — Telephone Encounter (Signed)
Caller & Relationship to patient:  self   Call back number:  [phone]   Date of last office visit:  11/09/2021   Date of next office visit:  02/28/2021   Medication(s) to be refilled:  Metoprolol and bacolofen        Preferred Pharmacy:  [facility] on [address]

## 2022-02-27 ENCOUNTER — Ambulatory Visit
Admission: RE | Admit: 2022-02-27 | Discharge: 2022-02-27 | Disposition: A | Discharge status: Home / Self Care | Payer: Medicare HMO | Source: Ambulatory Visit | Attending: Adult Health | Admitting: Adult Health

## 2022-02-27 DIAGNOSIS — R109 Unspecified abdominal pain: Secondary | ICD-10-CM

## 2022-02-27 DIAGNOSIS — R140 Abdominal distension (gaseous): Secondary | ICD-10-CM | POA: Diagnosis not present

## 2022-02-27 DIAGNOSIS — K449 Diaphragmatic hernia without obstruction or gangrene: Secondary | ICD-10-CM | POA: Diagnosis not present

## 2022-02-27 DIAGNOSIS — R197 Diarrhea, unspecified: Secondary | ICD-10-CM | POA: Diagnosis not present

## 2022-02-27 MED ORDER — IOPAMIDOL (ISOVUE-300) INJECTION 61%
100.0000 mL | Freq: Once | INTRAVENOUS | Status: AC | PRN
  Administered 2022-02-27: 100 mL via INTRAVENOUS

## 2022-02-28 ENCOUNTER — Ambulatory Visit: Payer: Medicare HMO | Admitting: Emergency Medicine

## 2022-03-02 ENCOUNTER — Ambulatory Visit (INDEPENDENT_AMBULATORY_CARE_PROVIDER_SITE_OTHER): Payer: Medicare HMO | Admitting: Emergency Medicine

## 2022-03-02 ENCOUNTER — Encounter: Payer: Self-pay | Admitting: Emergency Medicine

## 2022-03-02 VITALS — BP 142/74 | HR 74 | Temp 97.7°F | Ht 66.0 in | Wt 181.1 lb

## 2022-03-02 DIAGNOSIS — N390 Urinary tract infection, site not specified: Secondary | ICD-10-CM

## 2022-03-02 DIAGNOSIS — K599 Functional intestinal disorder, unspecified: Secondary | ICD-10-CM | POA: Insufficient documentation

## 2022-03-02 DIAGNOSIS — I10 Essential (primary) hypertension: Secondary | ICD-10-CM

## 2022-03-02 DIAGNOSIS — R569 Unspecified convulsions: Secondary | ICD-10-CM

## 2022-03-02 DIAGNOSIS — K219 Gastro-esophageal reflux disease without esophagitis: Secondary | ICD-10-CM

## 2022-03-02 DIAGNOSIS — R1013 Epigastric pain: Secondary | ICD-10-CM | POA: Insufficient documentation

## 2022-03-02 DIAGNOSIS — Z1231 Encounter for screening mammogram for malignant neoplasm of breast: Secondary | ICD-10-CM | POA: Diagnosis not present

## 2022-03-02 MED ORDER — BACLOFEN 10 MG PO TABS: 10.0000 mg | ORAL_TABLET | Freq: Every day | ORAL | 0 refills | Status: DC

## 2022-03-02 MED ORDER — METOPROLOL TARTRATE 50 MG PO TABS: 50.0000 mg | ORAL_TABLET | Freq: Two times a day (BID) | ORAL | 3 refills | Status: DC

## 2022-03-02 NOTE — Assessment & Plan Note (Addendum)
Recent 1 treated with Augmentin. Evaluated by urologist recently. CT scan of abdomen and pelvis report reviewed discussed with patient.

## 2022-03-02 NOTE — Patient Instructions (Signed)
Health Maintenance After Age 74 After age 74, you are at a higher risk for certain long-term diseases and infections as well as injuries from falls. Falls are a major cause of broken bones and head injuries in people who are older than age 74. Getting regular preventive care can help to keep you healthy and well. Preventive care includes getting regular testing and making lifestyle changes as recommended by your health care provider. Talk with your health care provider about: Which screenings and tests you should have. A screening is a test that checks for a disease when you have no symptoms. A diet and exercise plan that is right for you. What should I know about screenings and tests to prevent falls? Screening and testing are the best ways to find a health problem early. Early diagnosis and treatment give you the best chance of managing medical conditions that are common after age 74. Certain conditions and lifestyle choices may make you more likely to have a fall. Your health care provider may recommend: Regular vision checks. Poor vision and conditions such as cataracts can make you more likely to have a fall. If you wear glasses, make sure to get your prescription updated if your vision changes. Medicine review. Work with your health care provider to regularly review all of the medicines you are taking, including over-the-counter medicines. Ask your health care provider about any side effects that may make you more likely to have a fall. Tell your health care provider if any medicines that you take make you feel dizzy or sleepy. Strength and balance checks. Your health care provider may recommend certain tests to check your strength and balance while standing, walking, or changing positions. Foot health exam. Foot pain and numbness, as well as not wearing proper footwear, can make you more likely to have a fall. Screenings, including: Osteoporosis screening. Osteoporosis is a condition that causes  the bones to get weaker and break more easily. Blood pressure screening. Blood pressure changes and medicines to control blood pressure can make you feel dizzy. Depression screening. You may be more likely to have a fall if you have a fear of falling, feel depressed, or feel unable to do activities that you used to do. Alcohol use screening. Using too much alcohol can affect your balance and may make you more likely to have a fall. Follow these instructions at home: Lifestyle Do not drink alcohol if: Your health care provider tells you not to drink. If you drink alcohol: Limit how much you have to: 0-1 drink a day for women. 0-2 drinks a day for men. Know how much alcohol is in your drink. In the [place] one drink equals one 12 oz bottle of beer (355 mL), one 5 oz glass of wine (148 mL), or one 1 oz glass of hard liquor (44 mL). Do not use any products that contain nicotine or tobacco. These products include cigarettes, chewing tobacco, and vaping devices, such as e-cigarettes. If you need help quitting, ask your health care provider. Activity  Follow a regular exercise program to stay fit. This will help you maintain your balance. Ask your health care provider what types of exercise are appropriate for you. If you need a cane or walker, use it as recommended by your health care provider. Wear supportive shoes that have nonskid soles. Safety  Remove any tripping hazards, such as rugs, cords, and clutter. Install safety equipment such as grab bars in bathrooms and safety rails on stairs. Keep rooms and walkways   well-lit. General instructions Talk with your health care provider about your risks for falling. Tell your health care provider if: You fall. Be sure to tell your health care provider about all falls, even ones that seem minor. You feel dizzy, tiredness (fatigue), or off-balance. Take over-the-counter and prescription medicines only as told by your health care provider. These include  supplements. Eat a healthy diet and maintain a healthy weight. A healthy diet includes low-fat dairy products, low-fat (lean) meats, and fiber from whole grains, beans, and lots of fruits and vegetables. Stay current with your vaccines. Schedule regular health, dental, and eye exams. Summary Having a healthy lifestyle and getting preventive care can help to protect your health and wellness after age 74. Screening and testing are the best way to find a health problem early and help you avoid having a fall. Early diagnosis and treatment give you the best chance for managing medical conditions that are more common for people who are older than age 74. Falls are a major cause of broken bones and head injuries in people who are older than age 74. Take precautions to prevent a fall at home. Work with your health care provider to learn what changes you can make to improve your health and wellness and to prevent falls. This information is not intended to replace advice given to you by your health care provider. Make sure you discuss any questions you have with your health care provider. Document Revised: 05/31/2020 Document Reviewed: 05/31/2020 Elsevier Patient Education  2023 [employer]  

## 2022-03-02 NOTE — Assessment & Plan Note (Signed)
Stable.  On chronic medications handled by neurologist. Takes Keppra 500 mg twice a day

## 2022-03-02 NOTE — Assessment & Plan Note (Signed)
Active and affecting quality of life.  Taking omeprazole 40 mg daily. Needs GI evaluation and upper endoscopy.  Referral placed today. Diet and nutrition discussed.

## 2022-03-02 NOTE — Assessment & Plan Note (Signed)
Still active and affecting quality of life.  Continues omeprazole 40 mg daily.

## 2022-03-02 NOTE — Assessment & Plan Note (Signed)
Active and affecting quality of life. Needs GI evaluation.  Referral placed today.

## 2022-03-02 NOTE — Assessment & Plan Note (Signed)
Well-controlled hypertension with normal blood pressure readings at home. BP Readings from Last 3 Encounters:  03/02/22 (!) 142/74  01/25/22 (!) 146/70  11/09/21 138/82  Continue metoprolol tartrate 50 mg twice a day, amlodipine 5 mg daily, clonidine 0.1 mg twice a day and hydrochlorothiazide 25 mg daily. Cardiovascular risk associated with hypertension discussed. Dietary approaches to stop hypertension discussed.

## 2022-03-02 NOTE — Progress Notes (Signed)
[name] 72 y.o.   Chief Complaint  Patient presents with   Follow-up    1mth f/u appt, patient had CT scan done on her abd and wants to go over her results.     HISTORY OF PRESENT ILLNESS: This is a 72y.o. female complaining of intermittent episodes of reflux and heartburn along with indigestion symptoms with bloating and diffuse abdominal discomfort for the past several weeks. Recently saw urologist.  Had CT scan of abdomen and pelvis done.  Wants to go over results.  Has history of recurrent UTIs. No other complaints or medical concerns today.  HPI   Prior to Admission medications   Medication Sig Start Date End Date Taking? Authorizing Provider  acetaminophen (TYLENOL) 500 MG tablet Take 1,000 mg by mouth every 6 (six) hours as needed for mild pain.   Yes [provider]  amLODipine (NORVASC) 5 MG tablet Take 5 mg by mouth daily. 06/17/19  Yes [provider]  cloNIDine (CATAPRES) 0.1 MG tablet Take 1 tablet (0.1 mg total) by mouth 2 (two) times daily. 01/09/22  Yes Vilma Will, MInes Bloomer MD  fenofibrate 54 MG tablet Take 54 mg by mouth daily.  03/22/17  Yes [provider]  hydrochlorothiazide (HYDRODIURIL) 25 MG tablet Take 25 mg by mouth daily. 08/18/19  Yes [provider]  hydrocortisone 2.5 % cream Apply topically 2 (two) times daily as needed. Use up to 14 days 01/25/22  Yes Burns, SClaudina Lick MD  irbesartan (AVAPRO) 300 MG tablet Take 300 mg by mouth daily. 11/06/21  Yes [provider]  levETIRAcetam (KEPPRA) 500 MG tablet Take 1 tablet (500 mg total) by mouth 2 (two) times daily. 01/09/22  Yes Race Latour, MInes Bloomer MD  naproxen sodium (ALEVE) 220 MG tablet Take 440 mg by mouth 2 (two) times daily as needed (pain).   Yes [provider]  omeprazole (PRILOSEC) 40 MG capsule Take 1 capsule (40 mg total) by mouth daily. 01/25/22  Yes Burns, SClaudina Lick MD  traZODone (DESYREL) 100 MG tablet Take 1 tablet (100 mg total) by mouth at  bedtime. 07/14/19  Yes FCharlynne Cousins MD  baclofen (LIORESAL) 10 MG tablet Take 1 tablet (10 mg total) by mouth at bedtime. 03/02/22   SHorald Pollen MD  metoprolol tartrate (LOPRESSOR) 50 MG tablet Take 1 tablet (50 mg total) by mouth 2 (two) times daily. 03/02/22 06/30/22  SHorald Pollen MD    Allergies  Allergen Reactions   Barium-Containing Compounds Nausea And Vomiting   Eggs Or Egg-Derived Products Nausea And Vomiting   Red Dye Nausea And Vomiting    Patient Active Problem List   Diagnosis Date Noted   Acute cystitis 01/25/2022   Abnormal kidney function 11/09/2021   Anemia 11/09/2021   Adjustment disorder with depressed mood 07/13/2019   Chronic diarrhea 07/07/2019   Depression with suicidal ideation 07/07/2019   Vitamin D deficiency 02/15/2011   Fibromyalgia 02/14/2011   High risk medication use 02/14/2011   Hypertension 01/07/2011   GERD (gastroesophageal reflux disease) 12/30/2010   Grief at loss of child 12/30/2010   Gait instability 11/28/2010   Anxiety 09/01/2010   Nonruptured cerebral aneurysm 07/01/2010   Ulnar neuropathy 06/03/2010   Depression 06/03/2010   Insomnia 06/03/2010   Seizure ([place] 06/03/2010   Recurrent UTI 06/03/2010   Arthritis 06/03/2010    Past Medical History:  Diagnosis Date   Aneurysm ([facility]    brain   Anxiety    Arthritis    Depression  Fibromyalgia    GERD (gastroesophageal reflux disease)    Hyperlipidemia    pt. thought she was placed on medication for cholesterol but does not know for sure.   Hypertension    Insomnia    Seizures ([place])    [phone]    Past Surgical History:  Procedure Laterality Date   ABDOMINAL HYSTERECTOMY     BREAST CYST EXCISION Right    CEREBRAL ANEURYSM REPAIR     CEREBRAL ANEURYSM REPAIR     clipped    COLONOSCOPY     12+ years ago [place]   POLYPECTOMY      Social History   Socioeconomic History   Marital status: Divorced    Spouse name: Not on file   Number of children:  Not on file   Years of education: Not on file   Highest education level: Not on file  Occupational History   Not on file  Tobacco Use   Smoking status: Former    Types: Cigarettes    Quit date: 70    Years since quitting: 39.1   Smokeless tobacco: Never  Substance and Sexual Activity   Alcohol use: No   Drug use: No   Sexual activity: Not Currently  Other Topics Concern   Not on file  Social History Narrative   Not on file   Social Determinants of Health   Financial Resource Strain: Not on file  Food Insecurity: Not on file  Transportation Needs: Not on file  Physical Activity: Not on file  Stress: Not on file  Social Connections: Not on file  Intimate Partner Violence: Not on file    Family History  Problem Relation Age of Onset   Heart disease Mother    Heart disease Father    Cancer Brother    Arthritis Brother    Colon cancer Paternal Grandfather    Esophageal cancer Son    Colon polyps Neg Hx    Rectal cancer Neg Hx    Stomach cancer Neg Hx      Review of Systems  Constitutional: Negative.  Negative for chills and fever.  HENT: Negative.  Negative for congestion and sore throat.   Eyes: Negative.   Respiratory: Negative.  Negative for cough and shortness of breath.   Cardiovascular: Negative.  Negative for chest pain and palpitations.  Gastrointestinal:  Positive for abdominal pain, constipation, heartburn and nausea. Negative for blood in stool and melena.  Genitourinary: Negative.  Negative for dysuria and hematuria.  Skin: Negative.  Negative for rash.  Neurological: Negative.  Negative for dizziness and headaches.  Endo/Heme/Allergies: Negative.   All other systems reviewed and are negative.  Today's Vitals   03/02/22 1522  BP: (!) 142/74  Pulse: 74  Temp: 97.7 F (36.5 C)  TempSrc: Oral  Weight: 181 lb 2 oz (82.2 kg)  Height: '5\' 6"'$  (1.676 m)   Body mass index is 29.23 kg/m.   Physical Exam Vitals reviewed.  Constitutional:       Appearance: Normal appearance.  HENT:     Head: Normocephalic.  Eyes:     Extraocular Movements: Extraocular movements intact.     Pupils: Pupils are equal, round, and reactive to light.  Cardiovascular:     Rate and Rhythm: Normal rate and regular rhythm.     Pulses: Normal pulses.     Heart sounds: Normal heart sounds.  Pulmonary:     Effort: Pulmonary effort is normal.     Breath sounds: Normal breath sounds.  Abdominal:  Palpations: Abdomen is soft.     Tenderness: There is no abdominal tenderness.  Skin:    General: Skin is warm and dry.  Neurological:     Mental Status: She is alert and oriented to person, place, and time.  Psychiatric:        Mood and Affect: Mood normal.        Behavior: Behavior normal.      ASSESSMENT & PLAN: A total of 45 minutes was spent with the patient and counseling/coordination of care regarding preparing for this visit, review of most recent office visit notes, review of most recent urologist visit note, review of most recent CT scan of abdomen and pelvis report, review of multiple chronic medical conditions and their management, review of all medications, diagnosis of bowel dysfunction and need for GI evaluation with upper and lower endoscopies, education on nutrition, prognosis, documentation, need for follow-up  Problem List Items Addressed This Visit       Cardiovascular and Mediastinum   Hypertension (Chronic)    Well-controlled hypertension with normal blood pressure readings at home. BP Readings from Last 3 Encounters:  03/02/22 (!) 142/74  01/25/22 (!) 146/70  11/09/21 138/82  Continue metoprolol tartrate 50 mg twice a day, amlodipine 5 mg daily, clonidine 0.1 mg twice a day and hydrochlorothiazide 25 mg daily. Cardiovascular risk associated with hypertension discussed. Dietary approaches to stop hypertension discussed.       Relevant Medications   metoprolol tartrate (LOPRESSOR) 50 MG tablet     Digestive   GERD  (gastroesophageal reflux disease)    Still active and affecting quality of life.  Continues omeprazole 40 mg daily.      Bowel dysfunction - Primary    Active and affecting quality of life. Needs GI evaluation.  Referral placed today.      Relevant Orders   Ambulatory referral to Gastroenterology     Genitourinary   Recurrent UTI    Recent 1 treated with Augmentin. Evaluated by urologist recently. CT scan of abdomen and pelvis report reviewed discussed with patient.        Other   Seizure ([place])    Stable.  On chronic medications handled by neurologist. Takes Keppra 500 mg twice a day      Dyspepsia    Active and affecting quality of life.  Taking omeprazole 40 mg daily. Needs GI evaluation and upper endoscopy.  Referral placed today. Diet and nutrition discussed.      Relevant Orders   Ambulatory referral to Gastroenterology   Other Visit Diagnoses     Encounter for screening mammogram for malignant neoplasm of breast       Relevant Orders   MM Digital Screening      Patient Instructions  Health Maintenance After Age 25 After age 69, you are at a higher risk for certain long-term diseases and infections as well as injuries from falls. Falls are a major cause of broken bones and head injuries in people who are older than age 33. Getting regular preventive care can help to keep you healthy and well. Preventive care includes getting regular testing and making lifestyle changes as recommended by your health care provider. Talk with your health care provider about: Which screenings and tests you should have. A screening is a test that checks for a disease when you have no symptoms. A diet and exercise plan that is right for you. What should I know about screenings and tests to prevent falls? Screening and testing are the best  ways to find a health problem early. Early diagnosis and treatment give you the best chance of managing medical conditions that are common after age  2. Certain conditions and lifestyle choices may make you more likely to have a fall. Your health care provider may recommend: Regular vision checks. Poor vision and conditions such as cataracts can make you more likely to have a fall. If you wear glasses, make sure to get your prescription updated if your vision changes. Medicine review. Work with your health care provider to regularly review all of the medicines you are taking, including over-the-counter medicines. Ask your health care provider about any side effects that may make you more likely to have a fall. Tell your health care provider if any medicines that you take make you feel dizzy or sleepy. Strength and balance checks. Your health care provider may recommend certain tests to check your strength and balance while standing, walking, or changing positions. Foot health exam. Foot pain and numbness, as well as not wearing proper footwear, can make you more likely to have a fall. Screenings, including: Osteoporosis screening. Osteoporosis is a condition that causes the bones to get weaker and break more easily. Blood pressure screening. Blood pressure changes and medicines to control blood pressure can make you feel dizzy. Depression screening. You may be more likely to have a fall if you have a fear of falling, feel depressed, or feel unable to do activities that you used to do. Alcohol use screening. Using too much alcohol can affect your balance and may make you more likely to have a fall. Follow these instructions at home: Lifestyle Do not drink alcohol if: Your health care provider tells you not to drink. If you drink alcohol: Limit how much you have to: 0-1 drink a day for women. 0-2 drinks a day for men. Know how much alcohol is in your drink. In the [place] one drink equals one 12 oz bottle of beer (355 mL), one 5 oz glass of wine (148 mL), or one 1 oz glass of hard liquor (44 mL). Do not use any products that contain nicotine or  tobacco. These products include cigarettes, chewing tobacco, and vaping devices, such as e-cigarettes. If you need help quitting, ask your health care provider. Activity  Follow a regular exercise program to stay fit. This will help you maintain your balance. Ask your health care provider what types of exercise are appropriate for you. If you need a cane or walker, use it as recommended by your health care provider. Wear supportive shoes that have nonskid soles. Safety  Remove any tripping hazards, such as rugs, cords, and clutter. Install safety equipment such as grab bars in bathrooms and safety rails on stairs. Keep rooms and walkways well-lit. General instructions Talk with your health care provider about your risks for falling. Tell your health care provider if: You fall. Be sure to tell your health care provider about all falls, even ones that seem minor. You feel dizzy, tiredness (fatigue), or off-balance. Take over-the-counter and prescription medicines only as told by your health care provider. These include supplements. Eat a healthy diet and maintain a healthy weight. A healthy diet includes low-fat dairy products, low-fat (lean) meats, and fiber from whole grains, beans, and lots of fruits and vegetables. Stay current with your vaccines. Schedule regular health, dental, and eye exams. Summary Having a healthy lifestyle and getting preventive care can help to protect your health and wellness after age 2. Screening and testing are the  best way to find a health problem early and help you avoid having a fall. Early diagnosis and treatment give you the best chance for managing medical conditions that are more common for people who are older than age 34. Falls are a major cause of broken bones and head injuries in people who are older than age 81. Take precautions to prevent a fall at home. Work with your health care provider to learn what changes you can make to improve your health and  wellness and to prevent falls. This information is not intended to replace advice given to you by your health care provider. Make sure you discuss any questions you have with your health care provider. Document Revised: 05/31/2020 Document Reviewed: 05/31/2020 Elsevier Patient Education  [place], MD [facility]

## 2022-03-07 ENCOUNTER — Other Ambulatory Visit: Payer: Self-pay | Admitting: Emergency Medicine

## 2022-03-14 ENCOUNTER — Telehealth: Payer: Self-pay | Admitting: Emergency Medicine

## 2022-03-14 DIAGNOSIS — R569 Unspecified convulsions: Secondary | ICD-10-CM

## 2022-03-14 DIAGNOSIS — Z8679 Personal history of other diseases of the circulatory system: Secondary | ICD-10-CM

## 2022-03-14 NOTE — Telephone Encounter (Signed)
Pt called wanted updated referral sent to the neurology. Dr.Sagardia thought the referral was still good but the Neurology said they need updated referral sent back over.

## 2022-03-14 NOTE — Telephone Encounter (Signed)
Called patient to inform her that the referral to neurology re ordered

## 2022-03-15 NOTE — Telephone Encounter (Signed)
Patient called and would like to get something to replace her omperazole - she is running out of her medication because she is taking more than 1 day.  Please call patient:  [phone]

## 2022-03-21 NOTE — Telephone Encounter (Signed)
Patient needs something for her stomach.  Andres Labrum was for seizures

## 2022-03-23 ENCOUNTER — Ambulatory Visit: Payer: Medicare HMO | Admitting: Emergency Medicine

## 2022-04-03 ENCOUNTER — Ambulatory Visit: Payer: Medicare HMO | Admitting: Emergency Medicine

## 2022-04-04 ENCOUNTER — Other Ambulatory Visit: Payer: Self-pay | Admitting: Emergency Medicine

## 2022-04-05 ENCOUNTER — Other Ambulatory Visit: Payer: Self-pay | Admitting: Emergency Medicine

## 2022-04-05 ENCOUNTER — Ambulatory Visit (INDEPENDENT_AMBULATORY_CARE_PROVIDER_SITE_OTHER): Payer: Medicare HMO | Admitting: Emergency Medicine

## 2022-04-05 ENCOUNTER — Encounter: Payer: Self-pay | Admitting: Emergency Medicine

## 2022-04-05 VITALS — BP 124/66 | HR 65 | Temp 97.7°F | Ht 66.0 in | Wt 178.5 lb

## 2022-04-05 DIAGNOSIS — N649 Disorder of breast, unspecified: Secondary | ICD-10-CM | POA: Diagnosis not present

## 2022-04-05 DIAGNOSIS — I10 Essential (primary) hypertension: Secondary | ICD-10-CM

## 2022-04-05 DIAGNOSIS — K599 Functional intestinal disorder, unspecified: Secondary | ICD-10-CM | POA: Diagnosis not present

## 2022-04-05 DIAGNOSIS — K219 Gastro-esophageal reflux disease without esophagitis: Secondary | ICD-10-CM | POA: Diagnosis not present

## 2022-04-05 MED ORDER — OMEPRAZOLE 40 MG PO CPDR: 40.0000 mg | DELAYED_RELEASE_CAPSULE | Freq: Every day | ORAL | 3 refills | Status: DC

## 2022-04-05 NOTE — Assessment & Plan Note (Signed)
Recent CT scan of abdomen shows tiny hiatal hernia Symptoms well-controlled. Continue omeprazole 40 mg daily

## 2022-04-05 NOTE — Assessment & Plan Note (Signed)
Occasional discomfort Diet and nutrition discussed

## 2022-04-05 NOTE — Progress Notes (Signed)
[name] 72 y.o.   Chief Complaint  Patient presents with   itching around her breast     Patient states she is having itching around her nipple on her right breast, patient states she was suppose to go get a mammo and now needs a new referral for a diagnostic     HISTORY OF PRESENT ILLNESS: This is a 72 y.o. female complaining of itchy rash to areolar area right breast Needs referral for diagnostic mammogram Also has chronic intermittent abdominal dysfunction along with occasional heartburn  HPI   Prior to Admission medications   Medication Sig Start Date End Date Taking? Authorizing Provider  acetaminophen (TYLENOL) 500 MG tablet Take 1,000 mg by mouth every 6 (six) hours as needed for mild pain.   Yes [provider]  amLODipine (NORVASC) 5 MG tablet Take 5 mg by mouth daily. 06/17/19  Yes [provider]  baclofen (LIORESAL) 10 MG tablet TAKE 1 TABLET BY MOUTH EVERYDAY AT BEDTIME 04/04/22  Yes Kathalina Ostermann, Ines Bloomer, MD  cloNIDine (CATAPRES) 0.1 MG tablet Take 1 tablet (0.1 mg total) by mouth 2 (two) times daily. 01/09/22  Yes Aubrie Lucien, Ines Bloomer, MD  fenofibrate 54 MG tablet Take 54 mg by mouth daily.  03/22/17  Yes [provider]  hydrochlorothiazide (HYDRODIURIL) 25 MG tablet Take 25 mg by mouth daily. 08/18/19  Yes [provider]  hydrocortisone 2.5 % cream Apply topically 2 (two) times daily as needed. Use up to 14 days 01/25/22  Yes Burns, Claudina Lick, MD  irbesartan (AVAPRO) 300 MG tablet Take 300 mg by mouth daily. 11/06/21  Yes [provider]  levETIRAcetam (KEPPRA) 500 MG tablet TAKE 1 TABLET BY MOUTH TWICE A DAY 03/07/22  Yes Connor Foxworthy, Ines Bloomer, MD  metoprolol tartrate (LOPRESSOR) 50 MG tablet Take 1 tablet (50 mg total) by mouth 2 (two) times daily. 03/02/22 06/30/22 Yes Mariska Daffin, Ines Bloomer, MD  naproxen sodium (ALEVE) 220 MG tablet Take 440 mg by mouth 2 (two) times daily as needed (pain).   Yes [provider]   traZODone (DESYREL) 100 MG tablet Take 1 tablet (100 mg total) by mouth at bedtime. 07/14/19  Yes Charlynne Cousins, MD  omeprazole (PRILOSEC) 40 MG capsule Take 1 capsule (40 mg total) by mouth daily. 04/05/22   Horald Pollen, MD    Allergies  Allergen Reactions   Barium-Containing Compounds Nausea And Vomiting   Egg-Derived Products Nausea And Vomiting   Red Dye Nausea And Vomiting    Patient Active Problem List   Diagnosis Date Noted   Bowel dysfunction 03/02/2022   Dyspepsia 03/02/2022   Acute cystitis 01/25/2022   Abnormal kidney function 11/09/2021   Anemia 11/09/2021   Adjustment disorder with depressed mood 07/13/2019   Chronic diarrhea 07/07/2019   Depression with suicidal ideation 07/07/2019   Vitamin D deficiency 02/15/2011   Fibromyalgia 02/14/2011   High risk medication use 02/14/2011   Hypertension 01/07/2011   GERD (gastroesophageal reflux disease) 12/30/2010   Grief at loss of child 12/30/2010   Gait instability 11/28/2010   Anxiety 09/01/2010   Nonruptured cerebral aneurysm 07/01/2010   Ulnar neuropathy 06/03/2010   Depression 06/03/2010   Insomnia 06/03/2010   Seizure ([place]) 06/03/2010   Recurrent UTI 06/03/2010   Arthritis 06/03/2010    Past Medical History:  Diagnosis Date   Aneurysm ([place])    brain   Anxiety    Arthritis    Depression    Fibromyalgia    GERD (gastroesophageal reflux disease)  Hyperlipidemia    pt. thought she was placed on medication for cholesterol but does not know for sure.   Hypertension    Insomnia    Seizures ([place])    [phone]    Past Surgical History:  Procedure Laterality Date   ABDOMINAL HYSTERECTOMY     BREAST CYST EXCISION Right    CEREBRAL ANEURYSM REPAIR     CEREBRAL ANEURYSM REPAIR     clipped    COLONOSCOPY     12+ years ago [place]   POLYPECTOMY      Social History   Socioeconomic History   Marital status: Divorced    Spouse name: Not on file   Number of children: Not on file   Years  of education: Not on file   Highest education level: Not on file  Occupational History   Not on file  Tobacco Use   Smoking status: Former    Types: Cigarettes    Quit date: 30    Years since quitting: 39.2   Smokeless tobacco: Never  Substance and Sexual Activity   Alcohol use: No   Drug use: No   Sexual activity: Not Currently  Other Topics Concern   Not on file  Social History Narrative   Not on file   Social Determinants of Health   Financial Resource Strain: Not on file  Food Insecurity: Not on file  Transportation Needs: Not on file  Physical Activity: Not on file  Stress: Not on file  Social Connections: Not on file  Intimate Partner Violence: Not on file    Family History  Problem Relation Age of Onset   Heart disease Mother    Heart disease Father    Cancer Brother    Arthritis Brother    Colon cancer Paternal Grandfather    Esophageal cancer Son    Colon polyps Neg Hx    Rectal cancer Neg Hx    Stomach cancer Neg Hx      Review of Systems  Constitutional: Negative.  Negative for chills and fever.  HENT:  Negative for congestion and sore throat.   Respiratory: Negative.  Negative for cough and shortness of breath.   Cardiovascular: Negative.  Negative for chest pain and palpitations.  Gastrointestinal:  Positive for heartburn. Negative for abdominal pain, diarrhea, nausea and vomiting.  Genitourinary: Negative.   Skin:  Positive for rash (Right breast).  Neurological: Negative.  Negative for dizziness and headaches.  All other systems reviewed and are negative.   Vitals:   04/05/22 1411  BP: 124/66  Pulse: 65  Temp: 97.7 F (36.5 C)  SpO2: 95%    Physical Exam Vitals reviewed.  Constitutional:      Appearance: Normal appearance.  HENT:     Head: Normocephalic.     Mouth/Throat:     Mouth: Mucous membranes are moist.     Pharynx: Oropharynx is clear.  Eyes:     Extraocular Movements: Extraocular movements intact.     Pupils: Pupils  are equal, round, and reactive to light.  Cardiovascular:     Rate and Rhythm: Normal rate and regular rhythm.     Pulses: Normal pulses.     Heart sounds: Normal heart sounds.  Pulmonary:     Effort: Pulmonary effort is normal.     Breath sounds: Normal breath sounds.  Chest:       Comments: Erythematous scaly rash Normal nipple.  No retraction.  No discharge No masses felt Abdominal:     Palpations: Abdomen is  soft.     Tenderness: There is no abdominal tenderness.  Musculoskeletal:     Cervical back: No tenderness.  Lymphadenopathy:     Cervical: No cervical adenopathy.  Skin:    General: Skin is warm and dry.     Capillary Refill: Capillary refill takes less than 2 seconds.  Neurological:     Mental Status: She is alert and oriented to person, place, and time.  Psychiatric:        Mood and Affect: Mood normal.        Behavior: Behavior normal.     ASSESSMENT & PLAN: A total of 42 minutes was spent with the patient and counseling/coordination of care regarding preparing for this visit, review of most recent office visit notes, review of multiple chronic medical conditions and their management, review of all medications, need for diagnostic mammogram, education on nutrition, review of most recent blood work results, prognosis, documentation, and need for follow-up.  Problem List Items Addressed This Visit       Cardiovascular and Mediastinum   Hypertension (Chronic)    Well-controlled hypertension. Continue amlodipine 5 mg and hydrochlorothiazide 25 mg daily Continue irbesartan 300 mg daily and metoprolol titrate 50 mg twice a day BP Readings from Last 3 Encounters:  04/05/22 124/66  03/02/22 (!) 142/74  01/25/22 (!) 146/70          Digestive   GERD (gastroesophageal reflux disease)    Recent CT scan of abdomen shows tiny hiatal hernia Symptoms well-controlled. Continue omeprazole 40 mg daily      Relevant Medications   omeprazole (PRILOSEC) 40 MG capsule    Bowel dysfunction    Occasional discomfort Diet and nutrition discussed        Other   Breast lesion - Primary    Skin abnormality.  Differential diagnosis discussed No masses felt Needs diagnostic mammogram      Relevant Orders   MM 3D DIAGNOSTIC MAMMOGRAM BILATERAL BREAST   Patient Instructions  Health Maintenance After Age 82 After age 7, you are at a higher risk for certain long-term diseases and infections as well as injuries from falls. Falls are a major cause of broken bones and head injuries in people who are older than age 64. Getting regular preventive care can help to keep you healthy and well. Preventive care includes getting regular testing and making lifestyle changes as recommended by your health care provider. Talk with your health care provider about: Which screenings and tests you should have. A screening is a test that checks for a disease when you have no symptoms. A diet and exercise plan that is right for you. What should I know about screenings and tests to prevent falls? Screening and testing are the best ways to find a health problem early. Early diagnosis and treatment give you the best chance of managing medical conditions that are common after age 70. Certain conditions and lifestyle choices may make you more likely to have a fall. Your health care provider may recommend: Regular vision checks. Poor vision and conditions such as cataracts can make you more likely to have a fall. If you wear glasses, make sure to get your prescription updated if your vision changes. Medicine review. Work with your health care provider to regularly review all of the medicines you are taking, including over-the-counter medicines. Ask your health care provider about any side effects that may make you more likely to have a fall. Tell your health care provider if any medicines that you take  make you feel dizzy or sleepy. Strength and balance checks. Your health care provider may  recommend certain tests to check your strength and balance while standing, walking, or changing positions. Foot health exam. Foot pain and numbness, as well as not wearing proper footwear, can make you more likely to have a fall. Screenings, including: Osteoporosis screening. Osteoporosis is a condition that causes the bones to get weaker and break more easily. Blood pressure screening. Blood pressure changes and medicines to control blood pressure can make you feel dizzy. Depression screening. You may be more likely to have a fall if you have a fear of falling, feel depressed, or feel unable to do activities that you used to do. Alcohol use screening. Using too much alcohol can affect your balance and may make you more likely to have a fall. Follow these instructions at home: Lifestyle Do not drink alcohol if: Your health care provider tells you not to drink. If you drink alcohol: Limit how much you have to: 0-1 drink a day for women. 0-2 drinks a day for men. Know how much alcohol is in your drink. In the [place] one drink equals one 12 oz bottle of beer (355 mL), one 5 oz glass of wine (148 mL), or one 1 oz glass of hard liquor (44 mL). Do not use any products that contain nicotine or tobacco. These products include cigarettes, chewing tobacco, and vaping devices, such as e-cigarettes. If you need help quitting, ask your health care provider. Activity  Follow a regular exercise program to stay fit. This will help you maintain your balance. Ask your health care provider what types of exercise are appropriate for you. If you need a cane or walker, use it as recommended by your health care provider. Wear supportive shoes that have nonskid soles. Safety  Remove any tripping hazards, such as rugs, cords, and clutter. Install safety equipment such as grab bars in bathrooms and safety rails on stairs. Keep rooms and walkways well-lit. General instructions Talk with your health care provider  about your risks for falling. Tell your health care provider if: You fall. Be sure to tell your health care provider about all falls, even ones that seem minor. You feel dizzy, tiredness (fatigue), or off-balance. Take over-the-counter and prescription medicines only as told by your health care provider. These include supplements. Eat a healthy diet and maintain a healthy weight. A healthy diet includes low-fat dairy products, low-fat (lean) meats, and fiber from whole grains, beans, and lots of fruits and vegetables. Stay current with your vaccines. Schedule regular health, dental, and eye exams. Summary Having a healthy lifestyle and getting preventive care can help to protect your health and wellness after age 78. Screening and testing are the best way to find a health problem early and help you avoid having a fall. Early diagnosis and treatment give you the best chance for managing medical conditions that are more common for people who are older than age 50. Falls are a major cause of broken bones and head injuries in people who are older than age 35. Take precautions to prevent a fall at home. Work with your health care provider to learn what changes you can make to improve your health and wellness and to prevent falls. This information is not intended to replace advice given to you by your health care provider. Make sure you discuss any questions you have with your health care provider. Document Revised: 05/31/2020 Document Reviewed: 05/31/2020 Elsevier Patient Education  2023 Elsevier  Inc.     Agustina Caroli, MD [facility] at [facility]

## 2022-04-05 NOTE — Assessment & Plan Note (Addendum)
Well-controlled hypertension. Continue amlodipine 5 mg and hydrochlorothiazide 25 mg daily Continue irbesartan 300 mg daily and metoprolol titrate 50 mg twice a day BP Readings from Last 3 Encounters:  04/05/22 124/66  03/02/22 (!) 142/74  01/25/22 (!) 146/70

## 2022-04-05 NOTE — Patient Instructions (Signed)
Health Maintenance After Age 74 After age 74, you are at a higher risk for certain long-term diseases and infections as well as injuries from falls. Falls are a major cause of broken bones and head injuries in people who are older than age 74. Getting regular preventive care can help to keep you healthy and well. Preventive care includes getting regular testing and making lifestyle changes as recommended by your health care provider. Talk with your health care provider about: Which screenings and tests you should have. A screening is a test that checks for a disease when you have no symptoms. A diet and exercise plan that is right for you. What should I know about screenings and tests to prevent falls? Screening and testing are the best ways to find a health problem early. Early diagnosis and treatment give you the best chance of managing medical conditions that are common after age 74. Certain conditions and lifestyle choices may make you more likely to have a fall. Your health care provider may recommend: Regular vision checks. Poor vision and conditions such as cataracts can make you more likely to have a fall. If you wear glasses, make sure to get your prescription updated if your vision changes. Medicine review. Work with your health care provider to regularly review all of the medicines you are taking, including over-the-counter medicines. Ask your health care provider about any side effects that may make you more likely to have a fall. Tell your health care provider if any medicines that you take make you feel dizzy or sleepy. Strength and balance checks. Your health care provider may recommend certain tests to check your strength and balance while standing, walking, or changing positions. Foot health exam. Foot pain and numbness, as well as not wearing proper footwear, can make you more likely to have a fall. Screenings, including: Osteoporosis screening. Osteoporosis is a condition that causes  the bones to get weaker and break more easily. Blood pressure screening. Blood pressure changes and medicines to control blood pressure can make you feel dizzy. Depression screening. You may be more likely to have a fall if you have a fear of falling, feel depressed, or feel unable to do activities that you used to do. Alcohol use screening. Using too much alcohol can affect your balance and may make you more likely to have a fall. Follow these instructions at home: Lifestyle Do not drink alcohol if: Your health care provider tells you not to drink. If you drink alcohol: Limit how much you have to: 0-1 drink a day for women. 0-2 drinks a day for men. Know how much alcohol is in your drink. In the [place] one drink equals one 12 oz bottle of beer (355 mL), one 5 oz glass of wine (148 mL), or one 1 oz glass of hard liquor (44 mL). Do not use any products that contain nicotine or tobacco. These products include cigarettes, chewing tobacco, and vaping devices, such as e-cigarettes. If you need help quitting, ask your health care provider. Activity  Follow a regular exercise program to stay fit. This will help you maintain your balance. Ask your health care provider what types of exercise are appropriate for you. If you need a cane or walker, use it as recommended by your health care provider. Wear supportive shoes that have nonskid soles. Safety  Remove any tripping hazards, such as rugs, cords, and clutter. Install safety equipment such as grab bars in bathrooms and safety rails on stairs. Keep rooms and walkways   well-lit. General instructions Talk with your health care provider about your risks for falling. Tell your health care provider if: You fall. Be sure to tell your health care provider about all falls, even ones that seem minor. You feel dizzy, tiredness (fatigue), or off-balance. Take over-the-counter and prescription medicines only as told by your health care provider. These include  supplements. Eat a healthy diet and maintain a healthy weight. A healthy diet includes low-fat dairy products, low-fat (lean) meats, and fiber from whole grains, beans, and lots of fruits and vegetables. Stay current with your vaccines. Schedule regular health, dental, and eye exams. Summary Having a healthy lifestyle and getting preventive care can help to protect your health and wellness after age 74. Screening and testing are the best way to find a health problem early and help you avoid having a fall. Early diagnosis and treatment give you the best chance for managing medical conditions that are more common for people who are older than age 74. Falls are a major cause of broken bones and head injuries in people who are older than age 74. Take precautions to prevent a fall at home. Work with your health care provider to learn what changes you can make to improve your health and wellness and to prevent falls. This information is not intended to replace advice given to you by your health care provider. Make sure you discuss any questions you have with your health care provider. Document Revised: 05/31/2020 Document Reviewed: 05/31/2020 Elsevier Patient Education  2023 [employer]  

## 2022-04-05 NOTE — Assessment & Plan Note (Signed)
Skin abnormality.  Differential diagnosis discussed No masses felt Needs diagnostic mammogram

## 2022-05-18 DIAGNOSIS — H2513 Age-related nuclear cataract, bilateral: Secondary | ICD-10-CM | POA: Diagnosis not present

## 2022-05-26 ENCOUNTER — Ambulatory Visit
Admission: RE | Admit: 2022-05-26 | Discharge: 2022-05-26 | Disposition: A | Discharge status: Home / Self Care | Payer: Medicare HMO | Source: Ambulatory Visit | Attending: Emergency Medicine | Admitting: Emergency Medicine

## 2022-05-26 DIAGNOSIS — N6459 Other signs and symptoms in breast: Secondary | ICD-10-CM | POA: Diagnosis not present

## 2022-05-26 DIAGNOSIS — N649 Disorder of breast, unspecified: Secondary | ICD-10-CM

## 2022-05-26 DIAGNOSIS — Z1231 Encounter for screening mammogram for malignant neoplasm of breast: Secondary | ICD-10-CM | POA: Diagnosis not present

## 2022-05-30 ENCOUNTER — Other Ambulatory Visit: Payer: Self-pay | Admitting: Emergency Medicine

## 2022-05-30 ENCOUNTER — Telehealth: Payer: Self-pay

## 2022-05-30 MED ORDER — SULFAMETHOXAZOLE-TRIMETHOPRIM 800-160 MG PO TABS: 1.0000 | ORAL_TABLET | Freq: Two times a day (BID) | ORAL | 0 refills | Status: AC

## 2022-05-30 NOTE — Telephone Encounter (Signed)
New prescription sent to pharmacy of record today.  Thanks.

## 2022-05-30 NOTE — Telephone Encounter (Addendum)
Pt has called and stated she needed a refill for   sulfamethoxazole-trimethoprim (BACTRIM DS) 800-160 MG table  as she states she always gets a refill for her  bladder infections as she has been suffering from this almost all her life. Pt is asking that [facility].

## 2022-06-01 ENCOUNTER — Other Ambulatory Visit: Payer: Self-pay | Admitting: Emergency Medicine

## 2022-06-05 ENCOUNTER — Other Ambulatory Visit: Payer: Self-pay | Admitting: Emergency Medicine

## 2022-06-05 ENCOUNTER — Telehealth: Payer: Self-pay | Admitting: Emergency Medicine

## 2022-06-05 NOTE — Telephone Encounter (Signed)
I just sent a new prescription for metoprolol tartrate 25 mg tablets.  She is to take 2 tablets twice a day.  Please double check dose with patient.

## 2022-06-05 NOTE — Telephone Encounter (Signed)
[facility] called and wants to know if you can change patient's rx to 2(25 mg) metoprolol - patient is allergic to red dye and this is the only white one they have.  Please call pharmacy and advise - send new rx  Tresa Endo - [facility] [phone]

## 2022-06-05 NOTE — Telephone Encounter (Signed)
[facility] End[facility] with [facility] has called and was asking for clarification on pts medication. Tresa Endo has stated that pt is needing to take two 25 mg tablets of the Metoprolol immediate release. However, she noticed PCP has sent in the XR form with directions of taking 1 tablet once a day.  Tresa Endo is calling to ensure the above change is accurate. Please call [facility] with updated information.

## 2022-06-06 NOTE — Telephone Encounter (Signed)
Called patient and clarified that patient is to take 2 tabs twice a day. Informed her that a new rx was sent to her pharmacy

## 2022-06-23 ENCOUNTER — Telehealth: Payer: Self-pay

## 2022-06-23 MED ORDER — SULFAMETHOXAZOLE-TRIMETHOPRIM 800-160 MG PO TABS: 1.0000 | ORAL_TABLET | Freq: Two times a day (BID) | ORAL | 0 refills | Status: DC

## 2022-06-23 NOTE — Telephone Encounter (Signed)
Pt has called and stated she is needing a refill on sulfamethoxazole-trimethoprim (BACTRIM DS) 800-160 MG tablet   as she has frequent Bladder infections and is out of the above meds. Pt states she usually gets 14 pills at a time and is in need of some.  **Pt has stated she can not wait to see pcp cause she doesn't want the infection to get worst.  **Please call pt at [phone] with instructions on what to do.

## 2022-06-23 NOTE — Addendum Note (Signed)
Addended by: Corwin Levins on: 06/23/2022 04:49 PM   Modules accepted: Orders

## 2022-06-23 NOTE — Telephone Encounter (Signed)
Ok this time only, = done erx

## 2022-06-27 ENCOUNTER — Other Ambulatory Visit: Payer: Self-pay | Admitting: Emergency Medicine

## 2022-06-29 ENCOUNTER — Other Ambulatory Visit: Payer: Self-pay | Admitting: Emergency Medicine

## 2022-06-29 DIAGNOSIS — R079 Chest pain, unspecified: Secondary | ICD-10-CM

## 2022-07-17 ENCOUNTER — Other Ambulatory Visit: Payer: Self-pay | Admitting: Emergency Medicine

## 2022-07-17 ENCOUNTER — Telehealth: Payer: Self-pay | Admitting: Emergency Medicine

## 2022-07-17 MED ORDER — HYDROCHLOROTHIAZIDE 25 MG PO TABS: 25.0000 mg | ORAL_TABLET | Freq: Every day | ORAL | 3 refills | Status: DC

## 2022-07-17 NOTE — Telephone Encounter (Signed)
New prescription for hydrochlorothiazide sent to pharmacy requested today.  Thanks.

## 2022-07-17 NOTE — Telephone Encounter (Signed)
Prescription Request  07/17/2022  LOV: 04/05/2022  What is the name of the medication or equipment? hydrochlorithizide  Have you contacted your pharmacy to request a refill? Yes   Which pharmacy would you like this sent to?  [id number] [place], [place] - [address] AT [address] OF [facility] [address] [place] [address] Phone: [phone] Fax: [phone]    Patient notified that their request is being sent to the clinical staff for review and that they should receive a response within 2 business days.   Please advise at Mobile [phone] (mobile)

## 2022-07-19 DIAGNOSIS — H25812 Combined forms of age-related cataract, left eye: Secondary | ICD-10-CM | POA: Diagnosis not present

## 2022-07-19 DIAGNOSIS — H2512 Age-related nuclear cataract, left eye: Secondary | ICD-10-CM | POA: Diagnosis not present

## 2022-07-30 ENCOUNTER — Other Ambulatory Visit: Payer: Self-pay | Admitting: Internal Medicine

## 2022-08-05 ENCOUNTER — Other Ambulatory Visit: Payer: Self-pay | Admitting: Emergency Medicine

## 2022-08-05 DIAGNOSIS — R079 Chest pain, unspecified: Secondary | ICD-10-CM

## 2022-08-07 ENCOUNTER — Other Ambulatory Visit: Payer: Self-pay | Admitting: Emergency Medicine

## 2022-08-07 ENCOUNTER — Telehealth: Payer: Self-pay | Admitting: Emergency Medicine

## 2022-08-07 NOTE — Telephone Encounter (Signed)
Patient called and made an appointment for Monday 08/14/2022 - she insists that her medication me called in before her appointment.

## 2022-08-07 NOTE — Telephone Encounter (Signed)
Patient came to office, states she would like a prescription or refill sent in for her UTI, states she always has them and Dr. Alvy Bimler usually prescribes something for it. [phone] [address].

## 2022-08-07 NOTE — Telephone Encounter (Signed)
Not true that I usually prescribe something for her UTI.  Needs to be seen for this.  Any other provider can see her for this problem.  Thanks.

## 2022-08-08 ENCOUNTER — Ambulatory Visit: Payer: Medicare HMO | Admitting: Family Medicine

## 2022-08-08 DIAGNOSIS — N3020 Other chronic cystitis without hematuria: Secondary | ICD-10-CM | POA: Diagnosis not present

## 2022-08-08 NOTE — Telephone Encounter (Signed)
Called patient and left message for patient to call office to schedule an appointment to be seen for the UTI

## 2022-08-09 ENCOUNTER — Telehealth: Payer: Self-pay | Admitting: Emergency Medicine

## 2022-08-09 DIAGNOSIS — R079 Chest pain, unspecified: Secondary | ICD-10-CM

## 2022-08-09 MED ORDER — CLONIDINE HCL 0.1 MG PO TABS
0.1000 mg | ORAL_TABLET | Freq: Two times a day (BID) | ORAL | 0 refills | Status: DC
Start: 2022-08-09 — End: 2022-08-14

## 2022-08-09 NOTE — Telephone Encounter (Signed)
Sent 30 day supply =until appt../lmb 

## 2022-08-09 NOTE — Telephone Encounter (Signed)
Pt states she has two more pills left and she need a temporary refill to last her until Monday July 22nd  for cloNIDine (CATAPRES) 0.1 MG tablet   Please advise.

## 2022-08-14 ENCOUNTER — Ambulatory Visit (INDEPENDENT_AMBULATORY_CARE_PROVIDER_SITE_OTHER): Payer: Medicare HMO | Admitting: Emergency Medicine

## 2022-08-14 ENCOUNTER — Encounter: Payer: Self-pay | Admitting: Emergency Medicine

## 2022-08-14 VITALS — BP 128/88 | HR 56 | Temp 98.0°F | Ht 66.0 in | Wt 175.5 lb

## 2022-08-14 DIAGNOSIS — N390 Urinary tract infection, site not specified: Secondary | ICD-10-CM

## 2022-08-14 DIAGNOSIS — I10 Essential (primary) hypertension: Secondary | ICD-10-CM

## 2022-08-14 DIAGNOSIS — K219 Gastro-esophageal reflux disease without esophagitis: Secondary | ICD-10-CM | POA: Diagnosis not present

## 2022-08-14 DIAGNOSIS — R300 Dysuria: Secondary | ICD-10-CM | POA: Diagnosis not present

## 2022-08-14 LAB — COMPREHENSIVE METABOLIC PANEL
ALT: 20 U/L (ref 0–35)
AST: 21 U/L (ref 0–37)
Albumin: 4.3 g/dL (ref 3.5–5.2)
Alkaline Phosphatase: 58 U/L (ref 39–117)
BUN: 25 mg/dL — ABNORMAL HIGH (ref 6–23)
CO2: 29 mEq/L (ref 19–32)
Calcium: 9.4 mg/dL (ref 8.4–10.5)
Chloride: 96 mEq/L (ref 96–112)
Creatinine, Ser: 0.92 mg/dL (ref 0.40–1.20)
GFR: 61.45 mL/min (ref >60.00)
Glucose, Bld: 98 mg/dL (ref 70–99)
Potassium: 4.1 mEq/L (ref 3.5–5.1)
Sodium: 133 mEq/L — ABNORMAL LOW (ref 135–145)
Total Bilirubin: 0.2 mg/dL (ref 0.2–1.2)
Total Protein: 7.1 g/dL (ref 6.0–8.3)

## 2022-08-14 LAB — CBC WITH DIFFERENTIAL/PLATELET
Basophils Absolute: 0.1 10*3/uL (ref 0.0–0.1)
Basophils Relative: 1.2 % (ref 0.0–3.0)
Eosinophils Absolute: 0.2 10*3/uL (ref 0.0–0.7)
Eosinophils Relative: 3.7 % (ref 0.0–5.0)
HCT: 40.3 % (ref 36.0–46.0)
Hemoglobin: 13 g/dL (ref 12.0–15.0)
Lymphocytes Relative: 42.3 % (ref 12.0–46.0)
Lymphs Abs: 2.8 10*3/uL (ref 0.7–4.0)
MCHC: 32.2 g/dL (ref 30.0–36.0)
MCV: 87.4 fl (ref 78.0–100.0)
Monocytes Absolute: 0.7 10*3/uL (ref 0.1–1.0)
Monocytes Relative: 11.1 % (ref 3.0–12.0)
Neutro Abs: 2.7 10*3/uL (ref 1.4–7.7)
Neutrophils Relative %: 41.7 % — ABNORMAL LOW (ref 43.0–77.0)
Platelets: 343 10*3/uL (ref 150.0–400.0)
RBC: 4.61 Mil/uL (ref 3.87–5.11)
RDW: 14.4 % (ref 11.5–15.5)
WBC: 6.6 10*3/uL (ref 4.0–10.5)

## 2022-08-14 LAB — HEMOGLOBIN A1C: Hgb A1c MFr Bld: 5.8 % (ref 4.6–6.5)

## 2022-08-14 MED ORDER — CEFUROXIME AXETIL 250 MG PO TABS
250.0000 mg | ORAL_TABLET | Freq: Two times a day (BID) | ORAL | 0 refills | Status: AC
Start: 2022-08-14 — End: 2022-08-21

## 2022-08-14 MED ORDER — CLONIDINE HCL 0.1 MG PO TABS: 0.1000 mg | ORAL_TABLET | Freq: Two times a day (BID) | ORAL | 3 refills | Status: DC

## 2022-08-14 MED ORDER — OMEPRAZOLE 40 MG PO CPDR: 40.0000 mg | DELAYED_RELEASE_CAPSULE | Freq: Every day | ORAL | 3 refills | Status: DC

## 2022-08-14 NOTE — Assessment & Plan Note (Signed)
Partially treated with Macrobid. Repeat urinalysis and urine culture today Recommend to stop Macrobid and start Ceftin 250 mg twice a day pending culture Clinically stable.  No signs of pyelonephritis. ED precautions given. Advised to contact the office if no better or worse during the next several days

## 2022-08-14 NOTE — Progress Notes (Signed)
[name] 72 y.o.   Chief Complaint  Patient presents with   Medical Management of Chronic Issues    Patient states she currently is having a bladder infection. Currently taking medication from her urologist. She states she still not feeling well.     HISTORY OF PRESENT ILLNESS: This is a 72 y.o. female complaining of persistent urinary tract infection symptoms Has history of recurrent UTIs.  Was recently seen at urologist office by PA and prescribed Macrobid but symptoms still persisting Still having urinary frequency and burning on urination. Denies fever or chills.  Able to eat and drink.  Denies nausea or vomiting.  Denies flank pain. No other complaints or medical concerns today. BP Readings from Last 3 Encounters:  08/14/22 128/88  04/05/22 124/66  03/02/22 (!) 142/74   Wt Readings from Last 3 Encounters:  08/14/22 175 lb 8 oz (79.6 kg)  04/05/22 178 lb 8 oz (81 kg)  03/02/22 181 lb 2 oz (82.2 kg)     HPI   Prior to Admission medications   Medication Sig Start Date End Date Taking? Authorizing Provider  amLODipine (NORVASC) 5 MG tablet Take 5 mg by mouth daily. 06/17/19  Yes [provider]  baclofen (LIORESAL) 10 MG tablet TAKE 1 TABLET BY MOUTH EVERYDAY AT BEDTIME 06/27/22  Yes Murlin Schrieber, Eilleen Kempf, MD  cloNIDine (CATAPRES) 0.1 MG tablet Take 1 tablet (0.1 mg total) by mouth 2 (two) times daily. Keep scheduled appt for future reills 08/09/22  Yes Jamiesha Victoria, Eilleen Kempf, MD  fenofibrate 54 MG tablet Take 54 mg by mouth daily.  03/22/17  Yes [provider]  hydrochlorothiazide (HYDRODIURIL) 25 MG tablet Take 1 tablet (25 mg total) by mouth daily. 07/17/22  Yes Reisha Wos, Eilleen Kempf, MD  hydrocortisone 2.5 % cream APPLY TOPICALLY 2 (TWO) TIMES DAILY AS NEEDED. USE UP TO 14 DAYS 07/31/22  Yes Demon Volante, Eilleen Kempf, MD  irbesartan (AVAPRO) 300 MG tablet Take 300 mg by mouth daily. 11/06/21  Yes [provider]  levETIRAcetam (KEPPRA) 500 MG tablet TAKE  1 TABLET BY MOUTH TWICE A DAY 03/07/22  Yes Theresa Wedel, Eilleen Kempf, MD  metoprolol tartrate (LOPRESSOR) 25 MG tablet Take 2 tablets (50 mg total) by mouth 2 (two) times daily. 06/05/22  Yes Hali Balgobin, Eilleen Kempf, MD  naproxen sodium (ALEVE) 220 MG tablet Take 440 mg by mouth 2 (two) times daily as needed (pain).   Yes [provider]  omeprazole (PRILOSEC) 40 MG capsule Take 1 capsule (40 mg total) by mouth daily. 04/05/22  Yes Urbano Milhouse, Eilleen Kempf, MD  sulfamethoxazole-trimethoprim (BACTRIM DS) 800-160 MG tablet Take 1 tablet by mouth 2 (two) times daily. 06/23/22  Yes Corwin Levins, MD  traZODone (DESYREL) 100 MG tablet Take 1 tablet (100 mg total) by mouth at bedtime. 07/14/19  Yes Marinda Elk, MD  acetaminophen (TYLENOL) 500 MG tablet Take 1,000 mg by mouth every 6 (six) hours as needed for mild pain.    [provider]    Allergies  Allergen Reactions   Barium-Containing Compounds Nausea And Vomiting   Egg-Derived Products Nausea And Vomiting   Red Dye Nausea And Vomiting    Patient Active Problem List   Diagnosis Date Noted   Breast lesion 04/05/2022   Bowel dysfunction 03/02/2022   Abnormal kidney function 11/09/2021   Anemia 11/09/2021   Adjustment disorder with depressed mood 07/13/2019   Chronic diarrhea 07/07/2019   Depression with suicidal ideation 07/07/2019   Vitamin D deficiency 02/15/2011   Fibromyalgia 02/14/2011  High risk medication use 02/14/2011   Hypertension 01/07/2011   GERD (gastroesophageal reflux disease) 12/30/2010   Anxiety 09/01/2010   Nonruptured cerebral aneurysm 07/01/2010   Ulnar neuropathy 06/03/2010   Depression 06/03/2010   Insomnia 06/03/2010   Seizure (HCC) 06/03/2010   Recurrent UTI 06/03/2010   Arthritis 06/03/2010    Past Medical History:  Diagnosis Date   Aneurysm (HCC)    brain   Anxiety    Arthritis    Depression    Fibromyalgia    GERD (gastroesophageal reflux disease)    Hyperlipidemia    pt.  thought she was placed on medication for cholesterol but does not know for sure.   Hypertension    Insomnia    Seizures (HCC)    [phone]    Past Surgical History:  Procedure Laterality Date   ABDOMINAL HYSTERECTOMY     BREAST CYST EXCISION Right    CEREBRAL ANEURYSM REPAIR     CEREBRAL ANEURYSM REPAIR     clipped    COLONOSCOPY     12+ years ago [place]   POLYPECTOMY      Social History   Socioeconomic History   Marital status: Divorced    Spouse name: Not on file   Number of children: Not on file   Years of education: Not on file   Highest education level: Not on file  Occupational History   Not on file  Tobacco Use   Smoking status: Former    Current packs/day: 0.00    Types: Cigarettes    Quit date: 1985    Years since quitting: 39.5   Smokeless tobacco: Never  Substance and Sexual Activity   Alcohol use: No   Drug use: No   Sexual activity: Not Currently  Other Topics Concern   Not on file  Social History Narrative   Not on file   Social Determinants of Health   Financial Resource Strain: Not on file  Food Insecurity: Not on file  Transportation Needs: Not on file  Physical Activity: Not on file  Stress: Not on file  Social Connections: Not on file  Intimate Partner Violence: Not on file    Family History  Problem Relation Age of Onset   Heart disease Mother    Heart disease Father    Breast cancer Maternal Aunt        108s   Colon cancer Paternal Grandfather    Cancer Brother    Arthritis Brother    Esophageal cancer Son    Colon polyps Neg Hx    Rectal cancer Neg Hx    Stomach cancer Neg Hx      Review of Systems  Constitutional: Negative.  Negative for chills and fever.  HENT: Negative.  Negative for congestion and sore throat.   Respiratory: Negative.  Negative for cough and shortness of breath.   Cardiovascular: Negative.  Negative for chest pain and palpitations.  Gastrointestinal:  Negative for abdominal pain, diarrhea, nausea and  vomiting.  Genitourinary:  Positive for dysuria and frequency. Negative for flank pain and hematuria.  Skin: Negative.  Negative for rash.  Neurological: Negative.  Negative for dizziness and headaches.  All other systems reviewed and are negative.   Vitals:   08/14/22 1430  BP: 128/88  Pulse: (!) 56  Temp: 98 F (36.7 C)  SpO2: 96%    Physical Exam Vitals reviewed.  Constitutional:      Appearance: Normal appearance.  HENT:     Head: Normocephalic.  Mouth/Throat:     Mouth: Mucous membranes are moist.     Pharynx: Oropharynx is clear.  Eyes:     Extraocular Movements: Extraocular movements intact.     Pupils: Pupils are equal, round, and reactive to light.  Cardiovascular:     Rate and Rhythm: Normal rate and regular rhythm.     Pulses: Normal pulses.     Heart sounds: Normal heart sounds.  Pulmonary:     Effort: Pulmonary effort is normal.     Breath sounds: Normal breath sounds.  Abdominal:     Palpations: Abdomen is soft.     Tenderness: There is no abdominal tenderness. There is no right CVA tenderness or left CVA tenderness.  Musculoskeletal:     Cervical back: No tenderness.  Lymphadenopathy:     Cervical: No cervical adenopathy.  Skin:    General: Skin is warm and dry.     Capillary Refill: Capillary refill takes less than 2 seconds.  Neurological:     General: No focal deficit present.     Mental Status: She is alert and oriented to person, place, and time.  Psychiatric:        Mood and Affect: Mood normal.        Behavior: Behavior normal.      ASSESSMENT & PLAN: A total of 43 minutes was spent with the patient and counseling/coordination of care regarding preparing for this visit, review of most recent office visit notes, review of multiple chronic medical conditions under management, review of all medications, diagnosis of UTI and need for antibiotics, education on nutrition, importance of staying well-hydrated, review of health maintenance  items, prognosis, documentation, and need for follow-up.  Problem List Items Addressed This Visit       Cardiovascular and Mediastinum   Hypertension (Chronic)    History of resistant hypertension BP Readings from Last 3 Encounters:  08/14/22 128/88  04/05/22 124/66  03/02/22 (!) 142/74  Well-controlled on multiple medications Continue metoprolol tartrate 25 mg twice a day, irbesartan 300 mg once a day, hydrochlorothiazide 25 mg daily, amlodipine 5 mg daily and clonidine 0.1 mg twice a day Cardiovascular risks associated with hypertension discussed Diet and nutrition discussed       Relevant Medications   cloNIDine (CATAPRES) 0.1 MG tablet   Other Relevant Orders   Urine Culture   Urinalysis   CBC with Differential/Platelet   Comprehensive metabolic panel   Hemoglobin A1c     Digestive   GERD (gastroesophageal reflux disease)    Symptoms well-controlled on omeprazole 40 mg daily      Relevant Medications   omeprazole (PRILOSEC) 40 MG capsule   Other Relevant Orders   Urine Culture   Urinalysis   CBC with Differential/Platelet   Comprehensive metabolic panel   Hemoglobin A1c     Genitourinary   Acute UTI - Primary    Partially treated with Macrobid. Repeat urinalysis and urine culture today Recommend to stop Macrobid and start Ceftin 250 mg twice a day pending culture Clinically stable.  No signs of pyelonephritis. ED precautions given. Advised to contact the office if no better or worse during the next several days      Relevant Medications   cefUROXime (CEFTIN) 250 MG tablet   Other Relevant Orders   Urine Culture   Urinalysis   CBC with Differential/Platelet   Comprehensive metabolic panel   Hemoglobin A1c     Other   Dysuria    Persistent symptoms.  Still with partially treated urinary  tract infection. Will stop Macrobid today and start Ceftin 250 mg twice a day for 7 days.      Relevant Orders   Urine Culture   Urinalysis   CBC with  Differential/Platelet   Comprehensive metabolic panel   Hemoglobin A1c   Patient Instructions  Urinary Tract Infection, Adult A urinary tract infection (UTI) is an infection of any part of the urinary tract. The urinary tract includes: The kidneys. The ureters. The bladder. The urethra. These organs make, store, and get rid of pee (urine) in the body. What are the causes? This infection is caused by germs (bacteria) in your genital area. These germs grow and cause swelling (inflammation) of your urinary tract. What increases the risk? The following factors may make you more likely to develop this condition: Using a small, thin tube (catheter) to drain pee. Not being able to control when you pee or poop (incontinence). Being female. If you are female, these things can increase the risk: Using these methods to prevent pregnancy: A medicine that kills sperm (spermicide). A device that blocks sperm (diaphragm). Having low levels of a female hormone (estrogen). Being pregnant. You are more likely to develop this condition if: You have genes that add to your risk. You are sexually active. You take antibiotic medicines. You have trouble peeing because of: A prostate that is bigger than normal, if you are female. A blockage in the part of your body that drains pee from the bladder. A kidney stone. A nerve condition that affects your bladder. Not getting enough to drink. Not peeing often enough. You have other conditions, such as: Diabetes. A weak disease-fighting system (immune system). Sickle cell disease. Gout. Injury of the spine. What are the signs or symptoms? Symptoms of this condition include: Needing to pee right away. Peeing small amounts often. Pain or burning when peeing. Blood in the pee. Pee that smells bad or not like normal. Trouble peeing. Pee that is cloudy. Fluid coming from the vagina, if you are female. Pain in the belly or lower back. Other symptoms  include: Vomiting. Not feeling hungry. Feeling mixed up (confused). This may be the first symptom in older adults. Being tired and grouchy (irritable). A fever. Watery poop (diarrhea). How is this treated? Taking antibiotic medicine. Taking other medicines. Drinking enough water. In some cases, you may need to see a specialist. Follow these instructions at home:  Medicines Take over-the-counter and prescription medicines only as told by your doctor. If you were prescribed an antibiotic medicine, take it as told by your doctor. Do not stop taking it even if you start to feel better. General instructions Make sure you: Pee until your bladder is empty. Do not hold pee for a long time. Empty your bladder after sex. Wipe from front to back after peeing or pooping if you are a female. Use each tissue one time when you wipe. Drink enough fluid to keep your pee pale yellow. Keep all follow-up visits. Contact a doctor if: You do not get better after 1-2 days. Your symptoms go away and then come back. Get help right away if: You have very bad back pain. You have very bad pain in your lower belly. You have a fever. You have chills. You feeling like you will vomit or you vomit. Summary A urinary tract infection (UTI) is an infection of any part of the urinary tract. This condition is caused by germs in your genital area. There are many risk factors for a UTI. Treatment  includes antibiotic medicines. Drink enough fluid to keep your pee pale yellow. This information is not intended to replace advice given to you by your health care provider. Make sure you discuss any questions you have with your health care provider. Document Revised: 08/17/2019 Document Reviewed: 08/22/2019 Elsevier Patient Education  2024 Elsevier Inc.    Edwina Barth, MD [facility]

## 2022-08-14 NOTE — Patient Instructions (Signed)
Urinary Tract Infection, Adult A urinary tract infection (UTI) is an infection of any part of the urinary tract. The urinary tract includes: The kidneys. The ureters. The bladder. The urethra. These organs make, store, and get rid of pee (urine) in the body. What are the causes? This infection is caused by germs (bacteria) in your genital area. These germs grow and cause swelling (inflammation) of your urinary tract. What increases the risk? The following factors may make you more likely to develop this condition: Using a small, thin tube (catheter) to drain pee. Not being able to control when you pee or poop (incontinence). Being female. If you are female, these things can increase the risk: Using these methods to prevent pregnancy: A medicine that kills sperm (spermicide). A device that blocks sperm (diaphragm). Having low levels of a female hormone (estrogen). Being pregnant. You are more likely to develop this condition if: You have genes that add to your risk. You are sexually active. You take antibiotic medicines. You have trouble peeing because of: A prostate that is bigger than normal, if you are female. A blockage in the part of your body that drains pee from the bladder. A kidney stone. A nerve condition that affects your bladder. Not getting enough to drink. Not peeing often enough. You have other conditions, such as: Diabetes. A weak disease-fighting system (immune system). Sickle cell disease. Gout. Injury of the spine. What are the signs or symptoms? Symptoms of this condition include: Needing to pee right away. Peeing small amounts often. Pain or burning when peeing. Blood in the pee. Pee that smells bad or not like normal. Trouble peeing. Pee that is cloudy. Fluid coming from the vagina, if you are female. Pain in the belly or lower back. Other symptoms include: Vomiting. Not feeling hungry. Feeling mixed up (confused). This may be the first symptom in  older adults. Being tired and grouchy (irritable). A fever. Watery poop (diarrhea). How is this treated? Taking antibiotic medicine. Taking other medicines. Drinking enough water. In some cases, you may need to see a specialist. Follow these instructions at home:  Medicines Take over-the-counter and prescription medicines only as told by your doctor. If you were prescribed an antibiotic medicine, take it as told by your doctor. Do not stop taking it even if you start to feel better. General instructions Make sure you: Pee until your bladder is empty. Do not hold pee for a long time. Empty your bladder after sex. Wipe from front to back after peeing or pooping if you are a female. Use each tissue one time when you wipe. Drink enough fluid to keep your pee pale yellow. Keep all follow-up visits. Contact a doctor if: You do not get better after 1-2 days. Your symptoms go away and then come back. Get help right away if: You have very bad back pain. You have very bad pain in your lower belly. You have a fever. You have chills. You feeling like you will vomit or you vomit. Summary A urinary tract infection (UTI) is an infection of any part of the urinary tract. This condition is caused by germs in your genital area. There are many risk factors for a UTI. Treatment includes antibiotic medicines. Drink enough fluid to keep your pee pale yellow. This information is not intended to replace advice given to you by your health care provider. Make sure you discuss any questions you have with your health care provider. Document Revised: 08/17/2019 Document Reviewed: 08/22/2019 Elsevier Patient Education    2024 Elsevier Inc.  

## 2022-08-14 NOTE — Assessment & Plan Note (Signed)
Persistent symptoms.  Still with partially treated urinary tract infection. Will stop Macrobid today and start Ceftin 250 mg twice a day for 7 days.

## 2022-08-14 NOTE — Assessment & Plan Note (Signed)
Symptoms well controlled on omeprazole 40 mg daily.

## 2022-08-14 NOTE — Assessment & Plan Note (Signed)
History of resistant hypertension BP Readings from Last 3 Encounters:  08/14/22 128/88  04/05/22 124/66  03/02/22 (!) 142/74  Well-controlled on multiple medications Continue metoprolol tartrate 25 mg twice a day, irbesartan 300 mg once a day, hydrochlorothiazide 25 mg daily, amlodipine 5 mg daily and clonidine 0.1 mg twice a day Cardiovascular risks associated with hypertension discussed Diet and nutrition discussed

## 2022-08-15 LAB — URINALYSIS, ROUTINE W REFLEX MICROSCOPIC
Bilirubin Urine: NEGATIVE
Hgb urine dipstick: NEGATIVE
Ketones, ur: NEGATIVE
Nitrite: NEGATIVE
RBC / HPF: NONE SEEN (ref 0–?)
Specific Gravity, Urine: 1.01 (ref 1.000–1.030)
Total Protein, Urine: NEGATIVE
Urine Glucose: NEGATIVE
Urobilinogen, UA: 0.2 (ref 0.0–1.0)
pH: 7 (ref 5.0–8.0)

## 2022-08-16 LAB — URINE CULTURE: Result:: NO GROWTH

## 2022-08-18 DIAGNOSIS — N3000 Acute cystitis without hematuria: Secondary | ICD-10-CM | POA: Diagnosis not present

## 2022-08-23 DIAGNOSIS — H2511 Age-related nuclear cataract, right eye: Secondary | ICD-10-CM | POA: Diagnosis not present

## 2022-08-23 DIAGNOSIS — H25811 Combined forms of age-related cataract, right eye: Secondary | ICD-10-CM | POA: Diagnosis not present

## 2022-08-25 ENCOUNTER — Other Ambulatory Visit: Payer: Self-pay | Admitting: Emergency Medicine

## 2022-09-18 ENCOUNTER — Other Ambulatory Visit: Payer: Self-pay | Admitting: Emergency Medicine

## 2022-10-10 DIAGNOSIS — N3020 Other chronic cystitis without hematuria: Secondary | ICD-10-CM | POA: Diagnosis not present

## 2022-10-10 DIAGNOSIS — R8271 Bacteriuria: Secondary | ICD-10-CM | POA: Diagnosis not present

## 2022-10-25 ENCOUNTER — Telehealth: Payer: Self-pay | Admitting: Emergency Medicine

## 2022-10-25 ENCOUNTER — Other Ambulatory Visit: Payer: Self-pay | Admitting: Emergency Medicine

## 2022-10-25 MED ORDER — IRBESARTAN 300 MG PO TABS: 300.0000 mg | ORAL_TABLET | Freq: Every day | ORAL | 3 refills | Status: DC

## 2022-10-25 NOTE — Telephone Encounter (Signed)
Prescription Request  10/25/2022  LOV: 08/14/2022  What is the name of the medication or equipment? irbesartan (AVAPRO) 300 MG tablet   Have you contacted your pharmacy to request a refill? No   Which pharmacy would you like this sent to?  [id number] - [place], [place] - [address] AT [address] OF [facility] [address] [place] [address] Phone: [phone] Fax: [phone]    Patient notified that their request is being sent to the clinical staff for review and that they should receive a response within 2 business days.   Please advise at Mobile [phone] (mobile)

## 2022-10-25 NOTE — Telephone Encounter (Signed)
New prescription for irbesartan sent to pharmacy of record today.  Thanks.

## 2022-10-27 ENCOUNTER — Ambulatory Visit: Payer: Medicare HMO

## 2022-10-31 ENCOUNTER — Ambulatory Visit (INDEPENDENT_AMBULATORY_CARE_PROVIDER_SITE_OTHER): Payer: Medicare HMO

## 2022-10-31 DIAGNOSIS — Z23 Encounter for immunization: Secondary | ICD-10-CM | POA: Diagnosis not present

## 2022-10-31 NOTE — Progress Notes (Signed)
Patient presented in office today for her HD Flu Vaccine. HD Flu Vaccine was administered into her Left Deltoid Muscle. Patient tolerated her injection well and the injection site looked fine. Patient was advised to report to the office immediately if she notices any adverse reactions.

## 2022-11-28 DIAGNOSIS — R3914 Feeling of incomplete bladder emptying: Secondary | ICD-10-CM | POA: Diagnosis not present

## 2022-11-28 DIAGNOSIS — R3121 Asymptomatic microscopic hematuria: Secondary | ICD-10-CM | POA: Diagnosis not present

## 2022-11-28 DIAGNOSIS — N3020 Other chronic cystitis without hematuria: Secondary | ICD-10-CM | POA: Diagnosis not present

## 2022-12-27 ENCOUNTER — Other Ambulatory Visit: Payer: Self-pay | Admitting: Emergency Medicine

## 2022-12-29 ENCOUNTER — Telehealth: Payer: Self-pay | Admitting: Emergency Medicine

## 2022-12-29 ENCOUNTER — Telehealth: Payer: Self-pay

## 2022-12-29 NOTE — Telephone Encounter (Signed)
ERROR

## 2022-12-29 NOTE — Telephone Encounter (Signed)
Pt called wanting to get Dr. Alvy Bimler to prescribe medication that was not prescribe by him and also I let her know that he was currently out of the office today. Pt got angry stating this is unacceptable she need her meds to sleep at night. I also advise her I would send a note for Dr. Alvy Bimler to follow back on when he returns. Pt said who else is covering from him I let her know who the Doctor of the day was and advise her another Doctor can't prescribe medication that was prescribe from another office. I spoke with Selena Batten about it and return back to the phone I let the Pt know I was transferring her to management.

## 2022-12-29 NOTE — Telephone Encounter (Signed)
Patient has spoken to management and was informed she will have to wait till Monday

## 2022-12-29 NOTE — Telephone Encounter (Signed)
Pt called

## 2022-12-29 NOTE — Telephone Encounter (Signed)
Patient called in as she is down to her last Trazodone that she takes at bedtime. Patient stated that her previous PCP had prescribed it to her and this was the last refill.  She takes it nightly to help her rest of she experiences anxiety and elevated BP, and is requesting a refill.  Patient informed that Dr. Ivory Broad is the only one that is able to refill this class of medication and he will return on Monday.  Patient appreciative for the explanation but understands she will have to go without for a night or 2.  No additional questions at this time.   Patient last seen 07/2022 was started on by previous PCP Dr. Docia Chuck

## 2022-12-30 ENCOUNTER — Other Ambulatory Visit: Payer: Self-pay | Admitting: Emergency Medicine

## 2022-12-30 MED ORDER — TRAZODONE HCL 100 MG PO TABS: 100.0000 mg | ORAL_TABLET | Freq: Every day | ORAL | 0 refills | Status: DC

## 2022-12-30 NOTE — Telephone Encounter (Signed)
New prescription for trazodone sent today to pharmacy of record. Thanks.

## 2023-01-23 ENCOUNTER — Other Ambulatory Visit: Payer: Self-pay | Admitting: Emergency Medicine

## 2023-02-07 ENCOUNTER — Encounter: Payer: Self-pay | Admitting: Internal Medicine

## 2023-02-07 ENCOUNTER — Ambulatory Visit: Payer: Medicare HMO | Admitting: Internal Medicine

## 2023-02-07 VITALS — BP 130/84 | HR 60 | Temp 98.5°F | Ht 66.0 in | Wt 180.0 lb

## 2023-02-07 DIAGNOSIS — I10 Essential (primary) hypertension: Secondary | ICD-10-CM | POA: Diagnosis not present

## 2023-02-07 DIAGNOSIS — J069 Acute upper respiratory infection, unspecified: Secondary | ICD-10-CM

## 2023-02-07 LAB — POC INFLUENZA A&B (BINAX/QUICKVUE)
Influenza A, POC: NEGATIVE
Influenza B, POC: NEGATIVE

## 2023-02-07 LAB — POC COVID19 BINAXNOW: SARS Coronavirus 2 Ag: NEGATIVE

## 2023-02-07 LAB — POCT RESPIRATORY SYNCYTIAL VIRUS: RSV Rapid Ag: NEGATIVE

## 2023-02-07 NOTE — Progress Notes (Signed)
 Subjective:    Patient ID: [name], female    DOB: Aug 20, 1950, 73 y.o.   MRN: [id number]      HPI [name] is here for  Chief Complaint  Patient presents with   Cough    Cough, congestion x 2 days ago (started feeling bad) Cough seems to be dry (not productive); White patches on roof of mouth and side of tongue    She is here for an acute visit for cold symptoms.   Her symptoms started 2 days ago  She is experiencing fatigue, body aches, headaches, nasal congestion, ear pain, clogged ear or hearing loss in the right ear, sore throat, cough that is dry, chest tightness and wheezing.  She has chronic shortness of breath and denies any changes in her shortness of breath.  She has tried taking tylenol       Medications and allergies reviewed with patient and updated if appropriate.  Current Outpatient Medications on File Prior to Visit  Medication Sig Dispense Refill   acetaminophen  (TYLENOL ) 500 MG tablet Take 1,000 mg by mouth every 6 (six) hours as needed for mild pain.     amLODipine  (NORVASC ) 5 MG tablet Take 5 mg by mouth daily.     baclofen  (LIORESAL ) 10 MG tablet TAKE 1 TABLET BY MOUTH EVERYDAY AT BEDTIME 90 tablet 1   cloNIDine  (CATAPRES ) 0.1 MG tablet Take 1 tablet (0.1 mg total) by mouth 2 (two) times daily. Keep scheduled appt for future reills 120 tablet 3   fenofibrate  54 MG tablet TAKE 1 TABLET BY MOUTH EVERY DAY WITH FOOD 90 tablet 3   hydrochlorothiazide  (HYDRODIURIL ) 25 MG tablet Take 1 tablet (25 mg total) by mouth daily. 90 tablet 3   hydrocortisone  2.5 % cream APPLY TOPICALLY 2 (TWO) TIMES DAILY AS NEEDED. USE UP TO 14 DAYS 28 g 0   irbesartan  (AVAPRO ) 300 MG tablet Take 1 tablet (300 mg total) by mouth daily. 90 tablet 3   levETIRAcetam  (KEPPRA ) 500 MG tablet TAKE 1 TABLET BY MOUTH TWICE A DAY 180 tablet 2   metoprolol  tartrate (LOPRESSOR ) 25 MG tablet Take 2 tablets (50 mg total) by mouth 2 (two) times daily. 180 tablet 3   naproxen sodium (ALEVE) 220  MG tablet Take 440 mg by mouth 2 (two) times daily as needed (pain).     omeprazole  (PRILOSEC) 40 MG capsule Take 1 capsule (40 mg total) by mouth daily. 30 capsule 3   traZODone  (DESYREL ) 100 MG tablet TAKE 1 TABLET BY MOUTH EVERYDAY AT BEDTIME 90 tablet 1   No current facility-administered medications on file prior to visit.    Review of Systems  Constitutional:  Positive for fatigue. Negative for chills and fever.  HENT:  Positive for congestion, ear pain (mild), hearing loss (right ear) and sore throat. Negative for sinus pressure and sinus pain.   Respiratory:  Positive for cough (dry), chest tightness, shortness of breath (chronic - no change) and wheezing.   Musculoskeletal:  Positive for myalgias.  Neurological:  Positive for headaches. Negative for dizziness and light-headedness.       Objective:   Vitals:   02/07/23 1403  BP: 130/84  Pulse: 60  Temp: 98.5 F (36.9 C)  SpO2: 98%   BP Readings from Last 3 Encounters:  02/07/23 130/84  08/14/22 128/88  04/05/22 124/66   Wt Readings from Last 3 Encounters:  02/07/23 180 lb (81.6 kg)  08/14/22 175 lb 8 oz (79.6 kg)  04/05/22 178 lb 8 oz (81  kg)   Body mass index is 29.05 kg/m.    Physical Exam Constitutional:      General: She is not in acute distress.    Appearance: Normal appearance. She is not ill-appearing.  HENT:     Head: Normocephalic and atraumatic.     Right Ear: Tympanic membrane, ear canal and external ear normal.     Left Ear: Tympanic membrane, ear canal and external ear normal.     Mouth/Throat:     Mouth: Mucous membranes are moist.     Pharynx: No oropharyngeal exudate or posterior oropharyngeal erythema.  Eyes:     Conjunctiva/sclera: Conjunctivae normal.  Cardiovascular:     Rate and Rhythm: Normal rate and regular rhythm.  Pulmonary:     Effort: Pulmonary effort is normal. No respiratory distress.     Breath sounds: Normal breath sounds. No wheezing or rales.  Musculoskeletal:      Cervical back: Neck supple. No tenderness.  Lymphadenopathy:     Cervical: No cervical adenopathy.  Skin:    General: Skin is warm and dry.  Neurological:     Mental Status: She is alert.            Assessment & Plan:    URI: Acute Symptoms likely viral in nature COVID, flu and RSV test negative Continue symptomatic treatment with over-the-counter cold medications, Tylenol /ibuprofen  Increase rest and fluids Call if symptoms worsen or do not improve  Hypertension: Chronic Blood pressure well-controlled Continue amlodipine  5 mg daily, clonidine  0.1 mg twice daily, hydrochlorothiazide  25 mg daily, Avapro  300 mg daily, metoprolol  50 mg twice daily Avoid decongestants

## 2023-02-07 NOTE — Patient Instructions (Addendum)
 Medications changes include :   None     Return if symptoms worsen or fail to improve.     Upper Respiratory Infection, Adult An upper respiratory infection (URI) is a common viral infection of the nose, throat, and upper air passages that lead to the lungs. The most common type of URI is the common cold. URIs usually get better on their own, without medical treatment. What are the causes? A URI is caused by a virus. You may catch a virus by: Breathing in droplets from an infected person's cough or sneeze. Touching something that has been exposed to the virus (is contaminated) and then touching your mouth, nose, or eyes. What increases the risk? You are more likely to get a URI if: You are very young or very old. You have close contact with others, such as at work, school, or a health care facility. You smoke. You have long-term (chronic) heart or lung disease. You have a weakened disease-fighting system (immune system). You have nasal allergies or asthma. You are experiencing a lot of stress. You have poor nutrition. What are the signs or symptoms? A URI usually involves some of the following symptoms: Runny or stuffy (congested) nose. Cough. Sneezing. Sore throat. Headache. Fatigue. Fever. Loss of appetite. Pain in your forehead, behind your eyes, and over your cheekbones (sinus pain). Muscle aches. Redness or irritation of the eyes. Pressure in the ears or face. How is this diagnosed? This condition may be diagnosed based on your medical history and symptoms, and a physical exam. Your health care provider may use a swab to take a mucus sample from your nose (nasal swab). This sample can be tested to determine what virus is causing the illness. How is this treated? URIs usually get better on their own within 7-10 days. Medicines cannot cure URIs, but your health care provider may recommend certain medicines to help relieve symptoms, such as: Over-the-counter cold  medicines. Cough suppressants. Coughing is a type of defense against infection that helps to clear the respiratory system, so take these medicines only as recommended by your health care provider. Fever-reducing medicines. Follow these instructions at home: Activity Rest as needed. If you have a fever, stay home from work or school until your fever is gone or until your health care provider says your URI cannot spread to other people (is no longer contagious). Your health care provider may have you wear a face mask to prevent your infection from spreading. Relieving symptoms Gargle with a mixture of salt and water 3-4 times a day or as needed. To make salt water, completely dissolve -1 tsp (3-6 g) of salt in 1 cup (237 mL) of warm water. Use a cool-mist humidifier to add moisture to the air. This can help you breathe more easily. Eating and drinking  Drink enough fluid to keep your urine pale yellow. Eat soups and other clear broths. General instructions  Take over-the-counter and prescription medicines only as told by your health care provider. These include cold medicines, fever reducers, and cough suppressants. Do not use any products that contain nicotine or tobacco. These products include cigarettes, chewing tobacco, and vaping devices, such as e-cigarettes. If you need help quitting, ask your health care provider. Stay away from secondhand smoke. Stay up to date on all immunizations, including the yearly (annual) flu vaccine. Keep all follow-up visits. This is important. How to prevent the spread of infection to others URIs can be contagious. To prevent the infection from spreading:  Wash your hands with soap and water for at least 20 seconds. If soap and water are not available, use hand sanitizer. Avoid touching your mouth, face, eyes, or nose. Cough or sneeze into a tissue or your sleeve or elbow instead of into your hand or into the air.  Contact a health care provider if: You  are getting worse instead of better. You have a fever or chills. Your mucus is brown or red. You have yellow or brown discharge coming from your nose. You have pain in your face, especially when you bend forward. You have swollen neck glands. You have pain while swallowing. You have white areas in the back of your throat. Get help right away if: You have shortness of breath that gets worse. You have severe or persistent: Headache. Ear pain. Sinus pain. Chest pain. You have chronic lung disease along with any of the following: Making high-pitched whistling sounds when you breathe, most often when you breathe out (wheezing). Prolonged cough (more than 14 days). Coughing up blood. A change in your usual mucus. You have a stiff neck. You have changes in your: Vision. Hearing. Thinking. Mood. These symptoms may be an emergency. Get help right away. Call 911. Do not wait to see if the symptoms will go away. Do not drive yourself to the hospital. Summary An upper respiratory infection (URI) is a common infection of the nose, throat, and upper air passages that lead to the lungs. A URI is caused by a virus. URIs usually get better on their own within 7-10 days. Medicines cannot cure URIs, but your health care provider may recommend certain medicines to help relieve symptoms. This information is not intended to replace advice given to you by your health care provider. Make sure you discuss any questions you have with your health care provider. Document Revised: 08/11/2020 Document Reviewed: 08/11/2020 Elsevier Patient Education  2024 [employer].

## 2023-02-13 ENCOUNTER — Ambulatory Visit (INDEPENDENT_AMBULATORY_CARE_PROVIDER_SITE_OTHER): Payer: Medicare HMO | Admitting: Emergency Medicine

## 2023-02-13 ENCOUNTER — Encounter: Payer: Self-pay | Admitting: Emergency Medicine

## 2023-02-13 VITALS — BP 134/72 | HR 75 | Temp 97.9°F | Ht 66.0 in | Wt 180.0 lb

## 2023-02-13 DIAGNOSIS — J22 Unspecified acute lower respiratory infection: Secondary | ICD-10-CM | POA: Insufficient documentation

## 2023-02-13 DIAGNOSIS — R051 Acute cough: Secondary | ICD-10-CM | POA: Insufficient documentation

## 2023-02-13 MED ORDER — AMOXICILLIN-POT CLAVULANATE 875-125 MG PO TABS
1.0000 | ORAL_TABLET | Freq: Two times a day (BID) | ORAL | 0 refills | Status: AC
Start: 2023-02-13 — End: 2023-02-20

## 2023-02-13 MED ORDER — AIRSUPRA 90-80 MCG/ACT IN AERO
2.0000 | INHALATION_SPRAY | Freq: Two times a day (BID) | RESPIRATORY_TRACT | 1 refills | Status: AC
Start: 2023-02-13 — End: 2023-02-20

## 2023-02-13 MED ORDER — HYDROCODONE BIT-HOMATROP MBR 5-1.5 MG/5ML PO SOLN
5.0000 mL | Freq: Every evening | ORAL | 0 refills | Status: AC | PRN
Start: 2023-02-13 — End: ?

## 2023-02-13 NOTE — Assessment & Plan Note (Signed)
Cough management discussed Recommend over-the-counter Mucinex DM and cough drops Hycodan syrup at bedtime Advised to rest and stay well-hydrated Symptom management discussed

## 2023-02-13 NOTE — Progress Notes (Signed)
[name] 73 y.o.   Chief Complaint  Patient presents with   Cough    States since her last visit it has gotten worse. Slight sore throat, congestion, trouble sleeping. No fever, no body aches     HISTORY OF PRESENT ILLNESS: This is a 73 y.o. female complaining of flulike symptoms progressively getting worse over the last week. Mostly complaining of productive cough worse at night with congestion, sore throat, and trouble sleeping.  Cough Associated symptoms include wheezing. Pertinent negatives include no chest pain, chills, fever, headaches, hemoptysis, rash, sore throat or shortness of breath.     Prior to Admission medications   Medication Sig Start Date End Date Taking? Authorizing Provider  acetaminophen (TYLENOL) 500 MG tablet Take 1,000 mg by mouth every 6 (six) hours as needed for mild pain.   Yes [provider]  amLODipine (NORVASC) 5 MG tablet Take 5 mg by mouth daily. 06/17/19  Yes [provider]  baclofen (LIORESAL) 10 MG tablet TAKE 1 TABLET BY MOUTH EVERYDAY AT BEDTIME 12/27/22  Yes Jazzlin Clements, Eilleen Kempf, MD  cloNIDine (CATAPRES) 0.1 MG tablet Take 1 tablet (0.1 mg total) by mouth 2 (two) times daily. Keep scheduled appt for future reills 08/14/22  Yes Tyqwan Pink, Eilleen Kempf, MD  fenofibrate 54 MG tablet TAKE 1 TABLET BY MOUTH EVERY DAY WITH FOOD 09/18/22  Yes Shaquoia Miers, Eilleen Kempf, MD  hydrochlorothiazide (HYDRODIURIL) 25 MG tablet Take 1 tablet (25 mg total) by mouth daily. 07/17/22  Yes Maribell Demeo, Eilleen Kempf, MD  hydrocortisone 2.5 % cream APPLY TOPICALLY 2 (TWO) TIMES DAILY AS NEEDED. USE UP TO 14 DAYS 09/18/22  Yes Candon Caras, Eilleen Kempf, MD  irbesartan (AVAPRO) 300 MG tablet Take 1 tablet (300 mg total) by mouth daily. 10/25/22  Yes Georgina Quint, MD  levETIRAcetam (KEPPRA) 500 MG tablet TAKE 1 TABLET BY MOUTH TWICE A DAY 08/26/22  Yes Luka Reisch, Eilleen Kempf, MD  metoprolol tartrate (LOPRESSOR) 25 MG tablet Take 2 tablets (50 mg total) by mouth 2  (two) times daily. 06/05/22  Yes Jonathon Castelo, Eilleen Kempf, MD  naproxen sodium (ALEVE) 220 MG tablet Take 440 mg by mouth 2 (two) times daily as needed (pain).   Yes [provider]  omeprazole (PRILOSEC) 40 MG capsule Take 1 capsule (40 mg total) by mouth daily. 08/14/22  Yes Georgina Quint, MD  traZODone (DESYREL) 100 MG tablet TAKE 1 TABLET BY MOUTH EVERYDAY AT BEDTIME 01/23/23  Yes Kiyon Fidalgo, Eilleen Kempf, MD    Allergies  Allergen Reactions   Barium-Containing Compounds Nausea And Vomiting   Egg-Derived Products Nausea And Vomiting   Red Dye #40 (Allura Red) Nausea And Vomiting    Patient Active Problem List   Diagnosis Date Noted   Dysuria 08/14/2022   Breast lesion 04/05/2022   Bowel dysfunction 03/02/2022   Abnormal kidney function 11/09/2021   Anemia 11/09/2021   Adjustment disorder with depressed mood 07/13/2019   Chronic diarrhea 07/07/2019   Vitamin D deficiency 02/15/2011   Fibromyalgia 02/14/2011   High risk medication use 02/14/2011   Hypertension 01/07/2011   GERD (gastroesophageal reflux disease) 12/30/2010   Anxiety 09/01/2010   Nonruptured cerebral aneurysm 07/01/2010   Ulnar neuropathy 06/03/2010   Depression 06/03/2010   Insomnia 06/03/2010   Seizure (HCC) 06/03/2010   Acute UTI 06/03/2010   Arthritis 06/03/2010    Past Medical History:  Diagnosis Date   Aneurysm (HCC)    brain   Anxiety    Arthritis    Depression    Fibromyalgia  GERD (gastroesophageal reflux disease)    Hyperlipidemia    pt. thought she was placed on medication for cholesterol but does not know for sure.   Hypertension    Insomnia    Seizures (HCC)    [phone]    Past Surgical History:  Procedure Laterality Date   ABDOMINAL HYSTERECTOMY     BREAST CYST EXCISION Right    CEREBRAL ANEURYSM REPAIR     CEREBRAL ANEURYSM REPAIR     clipped    COLONOSCOPY     12+ years ago [place]   POLYPECTOMY      Social History   Socioeconomic History   Marital status:  Divorced    Spouse name: Not on file   Number of children: Not on file   Years of education: Not on file   Highest education level: Not on file  Occupational History   Not on file  Tobacco Use   Smoking status: Former    Current packs/day: 0.00    Types: Cigarettes    Quit date: 1985    Years since quitting: 40.0   Smokeless tobacco: Never  Substance and Sexual Activity   Alcohol use: No   Drug use: No   Sexual activity: Not Currently  Other Topics Concern   Not on file  Social History Narrative   Not on file   Social Drivers of Health   Financial Resource Strain: Not on file  Food Insecurity: Not on file  Transportation Needs: Not on file  Physical Activity: Not on file  Stress: Not on file  Social Connections: Not on file  Intimate Partner Violence: Not on file    Family History  Problem Relation Age of Onset   Heart disease Mother    Heart disease Father    Breast cancer Maternal Aunt        74s   Colon cancer Paternal Grandfather    Cancer Brother    Arthritis Brother    Esophageal cancer Son    Colon polyps Neg Hx    Rectal cancer Neg Hx    Stomach cancer Neg Hx      Review of Systems  Constitutional: Negative.  Negative for chills and fever.  HENT:  Positive for congestion. Negative for sore throat.   Respiratory:  Positive for cough, sputum production and wheezing. Negative for hemoptysis and shortness of breath.   Cardiovascular: Negative.  Negative for chest pain and palpitations.  Gastrointestinal:  Negative for abdominal pain, diarrhea, nausea and vomiting.  Genitourinary: Negative.  Negative for dysuria and hematuria.  Skin: Negative.  Negative for rash.  Neurological: Negative.  Negative for dizziness and headaches.  All other systems reviewed and are negative.   Vitals:   02/13/23 1609  BP: 134/72  Pulse: 75  Temp: 97.9 F (36.6 C)  SpO2: 98%    Physical Exam Vitals reviewed.  Constitutional:      Appearance: Normal appearance.   HENT:     Head: Normocephalic.     Mouth/Throat:     Mouth: Mucous membranes are moist.     Pharynx: Oropharynx is clear.  Eyes:     Extraocular Movements: Extraocular movements intact.     Pupils: Pupils are equal, round, and reactive to light.  Cardiovascular:     Rate and Rhythm: Normal rate and regular rhythm.     Pulses: Normal pulses.     Heart sounds: Normal heart sounds.  Pulmonary:     Effort: Pulmonary effort is normal.     Breath  sounds: Wheezing (Intermittent expiratory wheeze) present.  Musculoskeletal:     Cervical back: No tenderness.  Lymphadenopathy:     Cervical: No cervical adenopathy.  Skin:    General: Skin is warm and dry.     Capillary Refill: Capillary refill takes less than 2 seconds.  Neurological:     General: No focal deficit present.     Mental Status: She is alert and oriented to person, place, and time.  Psychiatric:        Mood and Affect: Mood normal.        Behavior: Behavior normal.      ASSESSMENT & PLAN: A total of 33 minutes was spent with the patient and counseling/coordination of care regarding preparing for this visit, review of most recent office visit notes, review of chronic medical conditions under management, review of all medications, diagnosis of lower respiratory tract infection and need to start antibiotics, symptom management, cough management, prognosis, documentation and need for follow-up if no better or worse during the next several days.  Problem List Items Addressed This Visit       Respiratory   Lower respiratory infection - Primary   Clinically stable.  No red flag signs or symptoms. No findings of pneumonia. Intermittent expiratory wheezing on exam Recommend to start Augmentin 875 mg twice a day for 7 days along with bronchodilator inhaler.  Recommend Airsupra twice a day for 5 to 7 days. ED precautions given. Advised to rest and stay well-hydrated Advised to contact the office if no better or worse during the  next several days      Relevant Medications   amoxicillin-clavulanate (AUGMENTIN) 875-125 MG tablet   HYDROcodone bit-homatropine (HYCODAN) 5-1.5 MG/5ML syrup   Albuterol-Budesonide (AIRSUPRA) 90-80 MCG/ACT AERO     Other   Acute cough   Cough management discussed Recommend over-the-counter Mucinex DM and cough drops Hycodan syrup at bedtime Advised to rest and stay well-hydrated Symptom management discussed      Relevant Medications   amoxicillin-clavulanate (AUGMENTIN) 875-125 MG tablet   HYDROcodone bit-homatropine (HYCODAN) 5-1.5 MG/5ML syrup   Albuterol-Budesonide (AIRSUPRA) 90-80 MCG/ACT AERO   Patient Instructions  Acute Bronchitis, Adult  Acute bronchitis is when air tubes in the lungs (bronchi) suddenly get swollen. The condition can make it hard for you to breathe. In adults, acute bronchitis usually goes away within 2 weeks. A cough caused by bronchitis may last up to 3 weeks. Smoking, allergies, and asthma can make the condition worse. What are the causes? Germs that cause cold and flu (viruses). The most common cause of this condition is the virus that causes the common cold. Bacteria. Substances that bother (irritate) the lungs, including: Smoke from cigarettes and other types of tobacco. Dust and pollen. Fumes from chemicals, gases, or burned fuel. Indoor or outdoor air pollution. What increases the risk? A weak body's defense system. This is also called the immune system. Any condition that affects your lungs and breathing, such as asthma. What are the signs or symptoms? A cough. Coughing up clear, yellow, or green mucus. Making high-pitched whistling sounds when you breathe, most often when you breathe out (wheezing). Runny or stuffy nose. Having too much mucus in your lungs (chest congestion). Shortness of breath. Body aches. A sore throat. How is this treated? Acute bronchitis may go away over time without treatment. Your doctor may tell you  to: Drink more fluids. This will help thin your mucus so it is easier to cough up. Use a device that gets medicine  into your lungs (inhaler). Use a vaporizer or a humidifier. These are machines that add water to the air. This helps with coughing and poor breathing. Take a medicine that thins mucus and helps clear it from your lungs. Take a medicine that prevents or stops coughing. It is not common to take an antibiotic medicine for this condition. Follow these instructions at home:  Take over-the-counter and prescription medicines only as told by your doctor. Use an inhaler, vaporizer, or humidifier as told by your doctor. Take two teaspoons (10 mL) of honey at bedtime. This helps lessen your coughing at night. Drink enough fluid to keep your pee (urine) pale yellow. Do not smoke or use any products that contain nicotine or tobacco. If you need help quitting, ask your doctor. Get a lot of rest. Return to your normal activities when your doctor says that it is safe. Keep all follow-up visits. How is this prevented?  Wash your hands often with soap and water for at least 20 seconds. If you cannot use soap and water, use hand sanitizer. Avoid contact with people who have cold symptoms. Try not to touch your mouth, nose, or eyes with your hands. Avoid breathing in smoke or chemical fumes. Make sure to get the flu shot every year. Contact a doctor if: Your symptoms do not get better in 2 weeks. You have trouble coughing up the mucus. Your cough keeps you awake at night. You have a fever. Get help right away if: You cough up blood. You have chest pain. You have very bad shortness of breath. You faint or keep feeling like you are going to faint. You have a very bad headache. Your fever or chills get worse. These symptoms may be an emergency. Get help right away. Call your local emergency services (911 in the [place] Do not wait to see if the symptoms will go away. Do not drive yourself  to the hospital. Summary Acute bronchitis is when air tubes in the lungs (bronchi) suddenly get swollen. In adults, acute bronchitis usually goes away within 2 weeks. Drink more fluids. This will help thin your mucus so it is easier to cough up. Take over-the-counter and prescription medicines only as told by your doctor. Contact a doctor if your symptoms do not improve after 2 weeks of treatment. This information is not intended to replace advice given to you by your health care provider. Make sure you discuss any questions you have with your health care provider. Document Revised: 05/12/2020 Document Reviewed: 05/12/2020 Elsevier Patient Education  2024 [employer]   Edwina Barth, MD [facility]

## 2023-02-13 NOTE — Assessment & Plan Note (Signed)
Clinically stable.  No red flag signs or symptoms. No findings of pneumonia. Intermittent expiratory wheezing on exam Recommend to start Augmentin 875 mg twice a day for 7 days along with bronchodilator inhaler.  Recommend Airsupra twice a day for 5 to 7 days. ED precautions given. Advised to rest and stay well-hydrated Advised to contact the office if no better or worse during the next several days

## 2023-02-13 NOTE — Patient Instructions (Signed)
Acute Bronchitis, Adult  Acute bronchitis is when air tubes in the lungs (bronchi) suddenly get swollen. The condition can make it hard for you to breathe. In adults, acute bronchitis usually goes away within 2 weeks. A cough caused by bronchitis may last up to 3 weeks. Smoking, allergies, and asthma can make the condition worse. What are the causes? Germs that cause cold and flu (viruses). The most common cause of this condition is the virus that causes the common cold. Bacteria. Substances that bother (irritate) the lungs, including: Smoke from cigarettes and other types of tobacco. Dust and pollen. Fumes from chemicals, gases, or burned fuel. Indoor or outdoor air pollution. What increases the risk? A weak body's defense system. This is also called the immune system. Any condition that affects your lungs and breathing, such as asthma. What are the signs or symptoms? A cough. Coughing up clear, yellow, or green mucus. Making high-pitched whistling sounds when you breathe, most often when you breathe out (wheezing). Runny or stuffy nose. Having too much mucus in your lungs (chest congestion). Shortness of breath. Body aches. A sore throat. How is this treated? Acute bronchitis may go away over time without treatment. Your doctor may tell you to: Drink more fluids. This will help thin your mucus so it is easier to cough up. Use a device that gets medicine into your lungs (inhaler). Use a vaporizer or a humidifier. These are machines that add water to the air. This helps with coughing and poor breathing. Take a medicine that thins mucus and helps clear it from your lungs. Take a medicine that prevents or stops coughing. It is not common to take an antibiotic medicine for this condition. Follow these instructions at home:  Take over-the-counter and prescription medicines only as told by your doctor. Use an inhaler, vaporizer, or humidifier as told by your doctor. Take two teaspoons  (10 mL) of honey at bedtime. This helps lessen your coughing at night. Drink enough fluid to keep your pee (urine) pale yellow. Do not smoke or use any products that contain nicotine or tobacco. If you need help quitting, ask your doctor. Get a lot of rest. Return to your normal activities when your doctor says that it is safe. Keep all follow-up visits. How is this prevented?  Wash your hands often with soap and water for at least 20 seconds. If you cannot use soap and water, use hand sanitizer. Avoid contact with people who have cold symptoms. Try not to touch your mouth, nose, or eyes with your hands. Avoid breathing in smoke or chemical fumes. Make sure to get the flu shot every year. Contact a doctor if: Your symptoms do not get better in 2 weeks. You have trouble coughing up the mucus. Your cough keeps you awake at night. You have a fever. Get help right away if: You cough up blood. You have chest pain. You have very bad shortness of breath. You faint or keep feeling like you are going to faint. You have a very bad headache. Your fever or chills get worse. These symptoms may be an emergency. Get help right away. Call your local emergency services (911 in the [place] Do not wait to see if the symptoms will go away. Do not drive yourself to the hospital. Summary Acute bronchitis is when air tubes in the lungs (bronchi) suddenly get swollen. In adults, acute bronchitis usually goes away within 2 weeks. Drink more fluids. This will help thin your mucus so it is easier  to cough up. Take over-the-counter and prescription medicines only as told by your doctor. Contact a doctor if your symptoms do not improve after 2 weeks of treatment. This information is not intended to replace advice given to you by your health care provider. Make sure you discuss any questions you have with your health care provider. Document Revised: 05/12/2020 Document Reviewed: 05/12/2020 Elsevier Patient  Education  2024 [employer].

## 2023-03-06 ENCOUNTER — Ambulatory Visit: Payer: Self-pay | Admitting: Emergency Medicine

## 2023-03-06 ENCOUNTER — Ambulatory Visit: Payer: Medicare HMO | Admitting: Emergency Medicine

## 2023-03-06 NOTE — Telephone Encounter (Signed)
Thanks

## 2023-03-06 NOTE — Telephone Encounter (Signed)
Copied from CRM [id number]. Topic: Clinical - Red Word Triage >> Mar 06, 2023  8:04 AM Irine Seal wrote: [facility] that prompted transfer to Nurse Triage: chest pain that patient feels like its coming from her lungs, persistent cough, body aches, itching in both breasts. Was seen for an acute visit 02/13/23 and given albuterol, amoxicillin, and HYDROcodone bit-homatropine (HYCODAN) 5-1.5 MG/5ML syrup, patient is wanting another acute visit   Chief Complaint: body aches Symptoms: dry cough, chest pain worsened with coughing Frequency: ongoing since yesterday Pertinent Negatives: Patient denies fever Disposition: [] ED /[] Urgent Care (no appt availability in office) / [x] Appointment(In office/virtual)/ []  [facility]/ [] Home Care/ [x] Refused Recommended Disposition /[] [facility]/ []  Follow-up with PCP Additional Notes: The patient was seen 02/13/23 for upper respiratory infection symptoms and was prescribed amoxicillin. Her symptoms resolved after completing the prescription. Yesterday, her symptoms returned including chest pain worsened with coughing.  She reported chest pain that she described as "uncomfortable."  She said it feels like it's in her lungs and the pain is worse with coughing.  She rated it an 8/10.  She reported "a little" shortness of breath with coughing and talking.  She has body aches and a dry cough.  She was advised to be seen this morning for further evaluation.  She declined stating that she is very tired due to not being able to sleep because of her symptoms as well as she will not have transportation until this afternoon.  Strongly encouraged her to call 911 with worsening symptoms.  She verbalized understanding.  Reason for Disposition  [1] MILD difficulty breathing (e.g., minimal/no SOB at rest, SOB with walking, pulse <100) AND [2] still present when not coughing  Answer Assessment - Initial Assessment Questions 1. ONSET: "When did the cough begin?"       Yesterday 03/05/23 2. SEVERITY: "How bad is the cough today?"      Not as severe  3. SPUTUM: "Describe the color of your sputum" (none, dry cough; clear, white, yellow, green)     None  4. HEMOPTYSIS: "Are you coughing up any blood?" If so ask: "How much?" (flecks, streaks, tablespoons, etc.)     None  5. DIFFICULTY BREATHING: "Are you having difficulty breathing?" If Yes, ask: "How bad is it?" (e.g., mild, moderate, severe)    - MILD: No SOB at rest, mild SOB with walking, speaks normally in sentences, can lie down, no retractions, pulse < 100.    - MODERATE: SOB at rest, SOB with minimal exertion and prefers to sit, cannot lie down flat, speaks in phrases, mild retractions, audible wheezing, pulse 100-120.    - SEVERE: Very SOB at rest, speaks in single words, struggling to breathe, sitting hunched forward, retractions, pulse > 120      Shortness of breath with coughing and talking "a little" 6. FEVER: "Do you have a fever?" If Yes, ask: "What is your temperature, how was it measured, and when did it start?"     No  10. OTHER SYMPTOMS: "Do you have any other symptoms?" (e.g., runny nose, wheezing, chest pain)       Body aches, chest hurting like it's my lungs - worse when coughing started yesterday; similar symptoms  Answer Assessment - Initial Assessment Questions 1. LOCATION: "Where does it hurt?"       Center in between breasts  2. RADIATION: "Does the pain go anywhere else?" (e.g., into neck, jaw, arms, back)     No  3. ONSET: "When  did the chest pain begin?" (Minutes, hours or days)      Yesterday  4. PATTERN: "Does the pain come and go, or has it been constant since it started?"  "Does it get worse with exertion?"      Constant; worse with coughing  5. DURATION: "How long does it last" (e.g., seconds, minutes, hours)     Ongoing since yesterday  6. SEVERITY: "How bad is the pain?"  (e.g., Scale 1-10; mild, moderate, or severe)    - MILD (1-3): doesn't interfere with normal  activities     - MODERATE (4-7): interferes with normal activities or awakens from sleep    - SEVERE (8-10): excruciating pain, unable to do any normal activities       8/10 uncomfortable  7. CARDIAC RISK FACTORS: "Do you have any history of heart problems or risk factors for heart disease?" (e.g., angina, prior heart attack; diabetes, high blood pressure, high cholesterol, smoker, or strong family history of heart disease)     Hypertension  8. PULMONARY RISK FACTORS: "Do you have any history of lung disease?"  (e.g., blood clots in lung, asthma, emphysema, birth control pills)     No  9. CAUSE: "What do you think is causing the chest pain?"     Possible upper respiratory infection  10. OTHER SYMPTOMS: "Do you have any other symptoms?" (e.g., dizziness, nausea, vomiting, sweating, fever, difficulty breathing, cough)       Body aches and dry cough  Protocols used: Cough - Acute Non-Productive-A-AH, Chest Pain-A-AH

## 2023-03-07 ENCOUNTER — Ambulatory Visit (INDEPENDENT_AMBULATORY_CARE_PROVIDER_SITE_OTHER): Payer: Medicare HMO

## 2023-03-07 ENCOUNTER — Ambulatory Visit: Payer: Medicare HMO | Admitting: Emergency Medicine

## 2023-03-07 ENCOUNTER — Encounter: Payer: Self-pay | Admitting: Emergency Medicine

## 2023-03-07 VITALS — BP 128/68 | HR 75 | Temp 97.5°F | Ht 66.0 in | Wt 182.0 lb

## 2023-03-07 DIAGNOSIS — K529 Noninfective gastroenteritis and colitis, unspecified: Secondary | ICD-10-CM

## 2023-03-07 DIAGNOSIS — N390 Urinary tract infection, site not specified: Secondary | ICD-10-CM

## 2023-03-07 DIAGNOSIS — I517 Cardiomegaly: Secondary | ICD-10-CM | POA: Diagnosis not present

## 2023-03-07 DIAGNOSIS — R300 Dysuria: Secondary | ICD-10-CM | POA: Diagnosis not present

## 2023-03-07 DIAGNOSIS — J988 Other specified respiratory disorders: Secondary | ICD-10-CM

## 2023-03-07 DIAGNOSIS — R053 Chronic cough: Secondary | ICD-10-CM | POA: Insufficient documentation

## 2023-03-07 DIAGNOSIS — B9789 Other viral agents as the cause of diseases classified elsewhere: Secondary | ICD-10-CM | POA: Insufficient documentation

## 2023-03-07 LAB — POC URINALSYSI DIPSTICK (AUTOMATED)
Bilirubin, UA: NEGATIVE
Blood, UA: 80
Glucose, UA: NEGATIVE
Ketones, UA: NEGATIVE
Nitrite, UA: POSITIVE
Protein, UA: NEGATIVE
Spec Grav, UA: 1.015 (ref 1.010–1.025)
Urobilinogen, UA: 0.2 U/dL
pH, UA: 6 (ref 5.0–8.0)

## 2023-03-07 MED ORDER — SULFAMETHOXAZOLE-TRIMETHOPRIM 800-160 MG PO TABS: 1.0000 | ORAL_TABLET | Freq: Two times a day (BID) | ORAL | 0 refills | Status: AC

## 2023-03-07 NOTE — Progress Notes (Signed)
[name] 73 y.o.   Chief Complaint  Patient presents with   Cough    HISTORY OF PRESENT ILLNESS: Acute problem visit today This is a 73 y.o. female complaining of persistent dry cough for the past several weeks. Was seen by me on 02/13/2023 for lower respiratory infection and started on Augmentin, Airsupra, and Hycodan syrup Feels better but still having a lingering cough Also has history of chronic UTIs and 2 days ago developed burning on urination.  Think she has a UTI again. History of multiple drug resistance related to UTIs. No other complaints or medical concerns today.  Cough Pertinent negatives include no chest pain, chills, fever, headaches, rash, sore throat or shortness of breath.     Prior to Admission medications   Medication Sig Start Date End Date Taking? Authorizing Provider  acetaminophen (TYLENOL) 500 MG tablet Take 1,000 mg by mouth every 6 (six) hours as needed for mild pain.    [provider]  amLODipine (NORVASC) 5 MG tablet Take 5 mg by mouth daily. 06/17/19   [provider]  baclofen (LIORESAL) 10 MG tablet TAKE 1 TABLET BY MOUTH EVERYDAY AT BEDTIME 12/27/22   Chastelyn Athens, Eilleen Kempf, MD  cloNIDine (CATAPRES) 0.1 MG tablet Take 1 tablet (0.1 mg total) by mouth 2 (two) times daily. Keep scheduled appt for future reills 08/14/22   Georgina Quint, MD  fenofibrate 54 MG tablet TAKE 1 TABLET BY MOUTH EVERY DAY WITH FOOD 09/18/22   Georgina Quint, MD  hydrochlorothiazide (HYDRODIURIL) 25 MG tablet Take 1 tablet (25 mg total) by mouth daily. 07/17/22   Georgina Quint, MD  HYDROcodone bit-homatropine [facility]) 5-1.5 MG/5ML syrup Take 5 mLs by mouth at bedtime as needed for cough. 02/13/23   Georgina Quint, MD  hydrocortisone 2.5 % cream APPLY TOPICALLY 2 (TWO) TIMES DAILY AS NEEDED. USE UP TO 14 DAYS 09/18/22   Georgina Quint, MD  irbesartan (AVAPRO) 300 MG tablet Take 1 tablet (300 mg total) by mouth daily. 10/25/22    Georgina Quint, MD  levETIRAcetam (KEPPRA) 500 MG tablet TAKE 1 TABLET BY MOUTH TWICE A DAY 08/26/22   Georgina Quint, MD  metoprolol tartrate (LOPRESSOR) 25 MG tablet Take 2 tablets (50 mg total) by mouth 2 (two) times daily. 06/05/22   Georgina Quint, MD  naproxen sodium (ALEVE) 220 MG tablet Take 440 mg by mouth 2 (two) times daily as needed (pain).    [provider]  omeprazole (PRILOSEC) 40 MG capsule Take 1 capsule (40 mg total) by mouth daily. 08/14/22   Georgina Quint, MD  traZODone (DESYREL) 100 MG tablet TAKE 1 TABLET BY MOUTH EVERYDAY AT BEDTIME 01/23/23   Georgina Quint, MD    Allergies  Allergen Reactions   Barium-Containing Compounds Nausea And Vomiting   Egg-Derived Products Nausea And Vomiting   Red Dye #40 (Allura Red) Nausea And Vomiting    Patient Active Problem List   Diagnosis Date Noted   Acute cough 02/13/2023   Lower respiratory infection 02/13/2023   Dysuria 08/14/2022   Breast lesion 04/05/2022   Bowel dysfunction 03/02/2022   Abnormal kidney function 11/09/2021   Anemia 11/09/2021   Adjustment disorder with depressed mood 07/13/2019   Chronic diarrhea 07/07/2019   Vitamin D deficiency 02/15/2011   Fibromyalgia 02/14/2011   High risk medication use 02/14/2011   Hypertension 01/07/2011   GERD (gastroesophageal reflux disease) 12/30/2010   Anxiety 09/01/2010   Nonruptured cerebral aneurysm 07/01/2010   Ulnar  neuropathy 06/03/2010   Depression 06/03/2010   Insomnia 06/03/2010   Seizure (HCC) 06/03/2010   Acute UTI 06/03/2010   Arthritis 06/03/2010    Past Medical History:  Diagnosis Date   Aneurysm (HCC)    brain   Anxiety    Arthritis    Depression    Fibromyalgia    GERD (gastroesophageal reflux disease)    Hyperlipidemia    pt. thought she was placed on medication for cholesterol but does not know for sure.   Hypertension    Insomnia    Seizures (HCC)    [phone]    Past Surgical History:   Procedure Laterality Date   ABDOMINAL HYSTERECTOMY     BREAST CYST EXCISION Right    CEREBRAL ANEURYSM REPAIR     CEREBRAL ANEURYSM REPAIR     clipped    COLONOSCOPY     12+ years ago [place]   POLYPECTOMY      Social History   Socioeconomic History   Marital status: Divorced    Spouse name: Not on file   Number of children: Not on file   Years of education: Not on file   Highest education level: Not on file  Occupational History   Not on file  Tobacco Use   Smoking status: Former    Current packs/day: 0.00    Types: Cigarettes    Quit date: 1985    Years since quitting: 40.1   Smokeless tobacco: Never  Substance and Sexual Activity   Alcohol use: No   Drug use: No   Sexual activity: Not Currently  Other Topics Concern   Not on file  Social History Narrative   Not on file   Social Drivers of Health   Financial Resource Strain: Not on file  Food Insecurity: Not on file  Transportation Needs: Not on file  Physical Activity: Not on file  Stress: Not on file  Social Connections: Not on file  Intimate Partner Violence: Not on file    Family History  Problem Relation Age of Onset   Heart disease Mother    Heart disease Father    Breast cancer Maternal Aunt        55s   Colon cancer Paternal Grandfather    Cancer Brother    Arthritis Brother    Esophageal cancer Son    Colon polyps Neg Hx    Rectal cancer Neg Hx    Stomach cancer Neg Hx      Review of Systems  Constitutional: Negative.  Negative for chills and fever.  HENT:  Positive for congestion. Negative for sore throat.   Respiratory:  Positive for cough. Negative for shortness of breath.   Cardiovascular: Negative.  Negative for chest pain and palpitations.  Gastrointestinal:  Negative for abdominal pain, diarrhea, nausea and vomiting.  Genitourinary:  Positive for dysuria and frequency. Negative for flank pain and hematuria.  Skin: Negative.  Negative for rash.  Neurological: Negative.  Negative  for dizziness and headaches.    Vitals:   03/07/23 1511  BP: 128/68  Pulse: 75  Temp: (!) 97.5 F (36.4 C)  SpO2: 95%     Physical Exam Vitals reviewed.  Constitutional:      Appearance: Normal appearance.  HENT:     Head: Normocephalic.     Mouth/Throat:     Mouth: Mucous membranes are moist.     Pharynx: Oropharynx is clear.  Eyes:     Extraocular Movements: Extraocular movements intact.     Pupils: Pupils  are equal, round, and reactive to light.  Cardiovascular:     Rate and Rhythm: Normal rate and regular rhythm.     Pulses: Normal pulses.     Heart sounds: Normal heart sounds.  Pulmonary:     Effort: Pulmonary effort is normal.     Breath sounds: Normal breath sounds.  Abdominal:     Palpations: Abdomen is soft.     Tenderness: There is no right CVA tenderness or left CVA tenderness.  Skin:    General: Skin is warm and dry.  Neurological:     Mental Status: She is alert and oriented to person, place, and time.  Psychiatric:        Mood and Affect: Mood normal.        Behavior: Behavior normal.    Results for orders placed or performed in visit on 03/07/23 (from the past 24 hours)  POCT Urinalysis Dipstick (Automated)     Status: Abnormal   Collection Time: 03/07/23  3:55 PM  Result Value Ref Range   Color, UA yellow    Clarity, UA cloudy    Glucose, UA Negative Negative   Bilirubin, UA negative    Ketones, UA negative    Spec Grav, UA 1.015 1.010 - 1.025   Blood, UA 80 Ery/uL    pH, UA 6.0 5.0 - 8.0   Protein, UA Negative Negative   Urobilinogen, UA 0.2 0.2 or 1.0 E.U./dL   Nitrite, UA positive    Leukocytes, UA Large (3+) (A) Negative      ASSESSMENT & PLAN: A total of 42 minutes was spent with the patient and counseling/coordination of care regarding preparing for this visit, review of most recent office visit notes, review of multiple chronic medical conditions under management, review of all medications, presence of urinary tract infection and  need for different type of antibiotic, cough management, review of chest x-ray done today, ED precautions, prognosis, documentation and need for follow-up if no better or worse during the next several days  Problem List Items Addressed This Visit       Respiratory   Viral respiratory illness   Clinically stable and running its course without complications Chest x-ray done today.  Report reviewed. Cough management discussed No signs of pneumonia      Relevant Medications   sulfamethoxazole-trimethoprim (BACTRIM DS) 800-160 MG tablet   Other Relevant Orders   DG Chest 2 View     Digestive   Chronic diarrhea   Most likely contributing to recurrent chronic UTIs        Genitourinary   Acute UTI - Primary   Has history of recurrent UTIs resistant to multiple antibiotics Clinically stable.  No signs of pyelonephritis Developed UTI despite taking Augmentin Took 2 days of cephalexin without improvement Recommend to start Bactrim DS twice a day for 7 days pending urine culture ED precautions given Advised to stay well-hydrated Advised to contact the office if no better or worse during the next several days      Relevant Medications   sulfamethoxazole-trimethoprim (BACTRIM DS) 800-160 MG tablet   Other Relevant Orders   Urine Culture     Other   Dysuria   Secondary to UTI Symptom management discussed.  May take over-the-counter Azo as needed Advised to start antibiotics and stay well-hydrated      Relevant Orders   Urine Culture   Persistent cough   Lingering cough from viral respiratory infection Overall better Cough management discussed Continue present medications, Mucinex DM, cough  drops, and Hycodan syrup at bedtime as needed      Relevant Orders   DG Chest 2 View   Patient Instructions  Urinary Tract Infection, Female A urinary tract infection (UTI) is an infection in your urinary tract. The urinary tract is made up of organs that make, store, and get rid of  pee (urine) in your body. These organs include: The kidneys. The ureters. The bladder. The urethra. What are the causes? Most UTIs are caused by germs called bacteria. They may be in or near your genitals. These germs grow and cause swelling in your urinary tract. What increases the risk? You're more likely to get a UTI if: You're a female. The urethra is shorter in females than in males. You have a soft tube called a catheter that drains your pee. You can't control when you pee or poop. You have trouble peeing because of: A kidney stone. A urinary blockage. A nerve condition that affects your bladder. Not getting enough to drink. You're sexually active. You use a birth control inside your vagina, like spermicide. You're pregnant. You have low levels of the hormone estrogen in your body. You're an older adult. You're also more likely to get a UTI if you have other health problems. These may include: Diabetes. A weak immune system. Your immune system is your body's defense system. Sickle cell disease. Injury of the spine. What are the signs or symptoms? Symptoms may include: Needing to pee right away. Peeing small amounts often. Pain or burning when you pee. Blood in your pee. Pee that smells bad or odd. Pain in your belly or lower back. You may also: Feel confused. This may be the first symptom in older adults. Vomit. Not feel hungry. Feel tired or easily annoyed. Have a fever or chills. How is this diagnosed? A UTI is diagnosed based on your medical history and an exam. You may also have other tests. These may include: Pee tests. Blood tests. Tests for sexually transmitted infections (STIs). If you've had more than one UTI, you may need to have imaging studies done to find out why you keep getting them. How is this treated? A UTI can be treated by: Taking antibiotics or other medicines. Drinking enough fluid to keep your pee pale yellow. In rare cases, a UTI can  cause a very bad condition called sepsis. Sepsis may be treated in the hospital. Follow these instructions at home: Medicines Take your medicines only as told by your health care provider. If you were given antibiotics, take them as told by your provider. Do not stop taking them even if you start to feel better. General instructions Make sure you: Pee often and fully. Do not hold your pee for a long time. Wipe from front to back after you pee or poop. Use each tissue only once when you wipe. Pee after you have sex. Do not douche or use sprays or powders in your genital area. Contact a health care provider if: Your symptoms don't get better after 1-2 days of taking antibiotics. Your symptoms go away and then come back. You have a fever or chills. You vomit or feel like you may vomit. Get help right away if: You have very bad pain in your back or lower belly. You faint. This information is not intended to replace advice given to you by your health care provider. Make sure you discuss any questions you have with your health care provider. Document Revised: 08/17/2022 Document Reviewed: 04/14/2022 Elsevier Patient Education  2024 [employer]    Edwina Barth, MD [facility]

## 2023-03-07 NOTE — Assessment & Plan Note (Signed)
Lingering cough from viral respiratory infection Overall better Cough management discussed Continue present medications, Mucinex DM, cough drops, and Hycodan syrup at bedtime as needed

## 2023-03-07 NOTE — Assessment & Plan Note (Signed)
Secondary to UTI Symptom management discussed.  May take over-the-counter Azo as needed Advised to start antibiotics and stay well-hydrated

## 2023-03-07 NOTE — Assessment & Plan Note (Signed)
Most likely contributing to recurrent chronic UTIs

## 2023-03-07 NOTE — Assessment & Plan Note (Signed)
Clinically stable and running its course without complications Chest x-ray done today.  Report reviewed. Cough management discussed No signs of pneumonia

## 2023-03-07 NOTE — Patient Instructions (Signed)

## 2023-03-07 NOTE — Assessment & Plan Note (Signed)
Has history of recurrent UTIs resistant to multiple antibiotics Clinically stable.  No signs of pyelonephritis Developed UTI despite taking Augmentin Took 2 days of cephalexin without improvement Recommend to start Bactrim DS twice a day for 7 days pending urine culture ED precautions given Advised to stay well-hydrated Advised to contact the office if no better or worse during the next several days

## 2023-03-09 LAB — URINE CULTURE

## 2023-03-10 ENCOUNTER — Other Ambulatory Visit: Payer: Self-pay | Admitting: Emergency Medicine

## 2023-03-10 DIAGNOSIS — N390 Urinary tract infection, site not specified: Secondary | ICD-10-CM

## 2023-03-10 MED ORDER — CEFUROXIME AXETIL 500 MG PO TABS
500.0000 mg | ORAL_TABLET | Freq: Two times a day (BID) | ORAL | 0 refills | Status: AC
Start: 2023-03-10 — End: 2023-03-17

## 2023-03-10 NOTE — Progress Notes (Signed)
If she still having chest pain, recommend to go to the emergency department.  Thanks.

## 2023-03-13 DIAGNOSIS — N952 Postmenopausal atrophic vaginitis: Secondary | ICD-10-CM | POA: Diagnosis not present

## 2023-03-13 DIAGNOSIS — Z780 Asymptomatic menopausal state: Secondary | ICD-10-CM | POA: Diagnosis not present

## 2023-03-13 DIAGNOSIS — B3731 Acute candidiasis of vulva and vagina: Secondary | ICD-10-CM | POA: Diagnosis not present

## 2023-03-13 DIAGNOSIS — Z01411 Encounter for gynecological examination (general) (routine) with abnormal findings: Secondary | ICD-10-CM | POA: Diagnosis not present

## 2023-03-19 ENCOUNTER — Ambulatory Visit: Payer: Self-pay | Admitting: Emergency Medicine

## 2023-03-19 NOTE — Telephone Encounter (Signed)
 Copied from CRM [id number]. Topic: Clinical - Red Word Triage >> Mar 19, 2023  4:34 PM Truddie Crumble wrote: Red Word that prompted transfer to Nurse Triage: recurring bladder infection and the patient is in a lot of pain  Chief Complaint: UTI Symptoms: Pain, urinary frequency, blood in urine Frequency: Today Pertinent Negatives: Patient denies fever Disposition: [] ED /[] Urgent Care (no appt availability in office) / [x] Appointment(In office/virtual)/ []  [facility]/ [] Home Care/ [] Refused Recommended Disposition /[] [facility]/ []  Follow-up with PCP Additional Notes: Patient called in to report UTI symptoms. Patient was recently seen in the office on 03/07/23 and treated for a UTI. Patient was prescribed Bactrim and the UTI went away. Patient stated symptoms went away for about a week, but have returned. Patient is experiencing abdominal pain, pain while urinating, urinary frequency and believes she has blood in her urine. Patient denied flank pain and fever. This RN advised patient to be seen within 24 hours, per protocol. No availability with PCP. Patient requested to see a MD. This RN scheduled patient for tomorrow with an alternate provider in the office. This RN advised patient to call back if anything worsens. Patient is requesting a refill of Bactrim because she stated it helped her symptoms. This RN advised that she would most likely need to be seen in the office again before an antibiotic would be prescribed. Patient requested this RN to ask for a refill of Bactrim regardless.   Reason for Disposition  Urinating more frequently than usual (i.e., frequency)  Answer Assessment - Initial Assessment Questions 1. SYMPTOM: "What's the main symptom you're concerned about?" (e.g., frequency, incontinence)     Pain 2. ONSET: "When did the urinary symptoms start?"     Pain and frequent urination 3. PAIN: "Is there any pain?" If Yes, ask: "How bad is it?" (Scale: 1-10; mild, moderate,  severe)     Lower abdominal pain 4. CAUSE: "What do you think is causing the symptoms?"     Bladder infection 5. OTHER SYMPTOMS: "Do you have any other symptoms?" (e.g., blood in urine, fever, flank pain, pain with urination)     States urine is slightly tinted with blood, denies fever  Protocols used: Urinary Symptoms-A-AH

## 2023-03-20 ENCOUNTER — Ambulatory Visit: Payer: Medicare HMO | Admitting: Internal Medicine

## 2023-03-20 ENCOUNTER — Telehealth: Payer: Self-pay

## 2023-03-20 NOTE — Telephone Encounter (Signed)
 Is she having UTI symptoms again?  Call patient and inquire.  Last urine culture report reviewed.  If she is having symptoms again she will be better off with ciprofloxacin instead as per urine culture sensitivity.  Thanks.

## 2023-03-20 NOTE — Telephone Encounter (Signed)
 Copied from CRM [id number]. Topic: Clinical - Medication Refill >> Mar 20, 2023  1:32 PM Alcus Dad wrote: Most Recent Primary Care Visit:  Provider: Georgina Quint  Department: [facility]  Visit Type: OFFICE VISIT  Date: 03/07/2023  Medication: sulfamethoxazole-trimethoprim  Has the patient contacted their pharmacy? No (Agent: If no, request that the patient contact the pharmacy for the refill. If patient does not wish to contact the pharmacy document the reason why and proceed with request.) (Agent: If yes, when and what did the pharmacy advise?)  Is this the correct pharmacy for this prescription? Yes If no, delete pharmacy and type the correct one.  This is the patient's preferred pharmacy:  [id number] - [place], [place] - [address] AT [address] OF [facility] [address] [place] [address] Phone: [phone] Fax: [phone]   Has the prescription been filled recently? Yes  Is the patient out of the medication? Yes  Has the patient been seen for an appointment in the last year OR does the patient have an upcoming appointment? Yes  Can we respond through MyChart? No  Agent: Please be advised that Rx refills may take up to 3 business days. We ask that you follow-up with your pharmacy.

## 2023-03-21 DIAGNOSIS — N3000 Acute cystitis without hematuria: Secondary | ICD-10-CM | POA: Diagnosis not present

## 2023-03-22 ENCOUNTER — Ambulatory Visit: Payer: Medicare HMO | Admitting: Emergency Medicine

## 2023-03-27 NOTE — Telephone Encounter (Signed)
 Spoke with patient and she is taking the Cipro states she still has some symptoms. She took the last pill last night states she will give it another whole day and will let us know if she is feeling better

## 2023-04-04 ENCOUNTER — Other Ambulatory Visit: Payer: Self-pay | Admitting: Emergency Medicine

## 2023-04-04 DIAGNOSIS — K219 Gastro-esophageal reflux disease without esophagitis: Secondary | ICD-10-CM

## 2023-04-06 ENCOUNTER — Other Ambulatory Visit: Payer: Self-pay | Admitting: Emergency Medicine

## 2023-04-06 DIAGNOSIS — I10 Essential (primary) hypertension: Secondary | ICD-10-CM

## 2023-04-06 NOTE — Telephone Encounter (Signed)
 LVM for patient to call back, provider is inquiring about this clonidine prescription.  It was not started by Dr. Alvy Bimler.  need to Find out if she is still taking it, how frequently, and who initially prescribed it.  Does patient  still need to be on this medication?

## 2023-04-06 NOTE — Telephone Encounter (Signed)
 Call this patient and inquire about this clonidine prescription.  It was not started by me.  Find out if she is still taking it, how frequently, and who initially prescribed it.  Does she still need to be on this medication?  Thanks

## 2023-04-09 ENCOUNTER — Other Ambulatory Visit: Payer: Self-pay | Admitting: Emergency Medicine

## 2023-04-09 DIAGNOSIS — I10 Essential (primary) hypertension: Secondary | ICD-10-CM

## 2023-04-09 NOTE — Telephone Encounter (Signed)
 Copied from CRM [id number]. Topic: Clinical - Medication Refill >> Apr 09, 2023 12:15 PM Mackie Pai E wrote: Most Recent Primary Care Visit:  Provider: Georgina Quint  Department: [facility] [place]  Visit Type: OFFICE VISIT  Date: 03/07/2023  Medication: cloNIDine (CATAPRES) 0.1 MG tablet  Has the patient contacted their pharmacy? Yes (Agent: If no, request that the patient contact the pharmacy for the refill. If patient does not wish to contact the pharmacy document the reason why and proceed with request.) (Agent: If yes, when and what did the pharmacy advise?)  Is this the correct pharmacy for this prescription? Yes If no, delete pharmacy and type the correct one.  This is the patient's preferred pharmacy:  [id number] - [place], [place] - [address] AT [address] OF [facility] [address] [place] [address] Phone: [phone] Fax: [phone]   Has the prescription been filled recently? No  Is the patient out of the medication? No, patient only has 2 pills left.  Has the patient been seen for an appointment in the last year OR does the patient have an upcoming appointment? Yes  Can we respond through MyChart? No  Agent: Please be advised that Rx refills may take up to 3 business days. We ask that you follow-up with your pharmacy.

## 2023-04-10 ENCOUNTER — Telehealth: Payer: Self-pay | Admitting: Emergency Medicine

## 2023-04-10 ENCOUNTER — Other Ambulatory Visit: Payer: Self-pay | Admitting: Radiology

## 2023-04-10 DIAGNOSIS — I10 Essential (primary) hypertension: Secondary | ICD-10-CM

## 2023-04-10 MED ORDER — CLONIDINE HCL 0.1 MG PO TABS
0.1000 mg | ORAL_TABLET | Freq: Two times a day (BID) | ORAL | 3 refills | Status: DC
Start: 2023-04-10 — End: 2023-09-21

## 2023-04-10 NOTE — Telephone Encounter (Signed)
 Copied from CRM [id number]. Topic: Clinical - Medication Refill >> Apr 10, 2023 10:57 AM Alcus Dad wrote: Most Recent Primary Care Visit:  Provider: Georgina Quint  Department: [facility] [place]  Visit Type: OFFICE VISIT  Date: 03/07/2023  Medication: cloNIDine (CATAPRES) 0.1 MG tablet  Has the patient contacted their pharmacy? Yes (Agent: If no, request that the patient contact the pharmacy for the refill. If patient does not wish to contact the pharmacy document the reason why and proceed with request.) (Agent: If yes, when and what did the pharmacy advise?)  Is this the correct pharmacy for this prescription? Yes If no, delete pharmacy and type the correct one.  This is the patient's preferred pharmacy:  [id number] - , [place] - [address] AT [address] OF [facility] [address] [place] [address] Phone: [phone] Fax: [phone]   Has the prescription been filled recently? No  Is the patient out of the medication? No  Has the patient been seen for an appointment in the last year OR does the patient have an upcoming appointment? Yes  Can we respond through MyChart? No  Agent: Please be advised that Rx refills may take up to 3 business days. We ask that you follow-up with your pharmacy.

## 2023-04-10 NOTE — Telephone Encounter (Signed)
 Copied from CRM [id number]. Topic: Clinical - Prescription Issue >> Apr 10, 2023 11:01 AM Alcus Dad wrote: Reason for CRM: Patient stated that her medication is not at the pharmacy. Informed patient that it could take up to 3 days. She can not go a day without taking her medication and she is on her last dose today.cloNIDine (CATAPRES) 0.1 MG tablet

## 2023-04-11 ENCOUNTER — Telehealth: Payer: Self-pay | Admitting: Emergency Medicine

## 2023-04-11 ENCOUNTER — Other Ambulatory Visit: Payer: Self-pay | Admitting: Radiology

## 2023-04-11 DIAGNOSIS — I10 Essential (primary) hypertension: Secondary | ICD-10-CM

## 2023-04-11 NOTE — Telephone Encounter (Signed)
 Copied from CRM [id number]. Topic: Clinical - Medication Question >> Apr 11, 2023  1:08 PM Orinda Kenner C wrote: Reason for CRM: Patient [id number] wants to speak with nurse today on refill denial for amLODipine (NORVASC) 5 MG tablet. Patient does not feel it's necessary to come in to have medication refilled, she's been on this medication for a long time. Patient is unhappy and insisting to speak with a nurse only within the hour. Informed patient, the CMA are with patients and will contact patient when they are free. Patient insist a call back by today. Patient states she was just seen a few weeks ago.

## 2023-04-11 NOTE — Telephone Encounter (Signed)
 Okay to refill?

## 2023-04-12 ENCOUNTER — Other Ambulatory Visit: Payer: Self-pay | Admitting: Radiology

## 2023-04-12 DIAGNOSIS — I10 Essential (primary) hypertension: Secondary | ICD-10-CM

## 2023-04-12 MED ORDER — AMLODIPINE BESYLATE 5 MG PO TABS
5.0000 mg | ORAL_TABLET | Freq: Every day | ORAL | 3 refills | Status: AC
Start: 2023-04-12 — End: ?

## 2023-05-22 ENCOUNTER — Other Ambulatory Visit: Payer: Self-pay | Admitting: Emergency Medicine

## 2023-05-22 NOTE — Telephone Encounter (Signed)
 Copied from CRM [id number]. Topic: Clinical - Medication Refill >> May 22, 2023  3:51 PM Marlan Silva wrote: Most Recent Primary Care Visit:  Provider: Elvira Hammersmith  Department: [facility] [place]  Visit Type: OFFICE VISIT  Date: 03/07/2023  Medication: hydrocortisone  2.5 % cream  Has the patient contacted their pharmacy? Yes (Agent: If no, request that the patient contact the pharmacy for the refill. If patient does not wish to contact the pharmacy document the reason why and proceed with request.) (Agent: If yes, when and what did the pharmacy advise?)  Is this the correct pharmacy for this prescription? Yes If no, delete pharmacy and type the correct one.  This is the patient's preferred pharmacy:  [id number] - [place], [place] - [address] AT [address] [facility] [address] [place] [address] Phone: [phone] Fax: [phone]   Has the prescription been filled recently? Yes  Is the patient out of the medication? Yes  Has the patient been seen for an appointment in the last year OR does the patient have an upcoming appointment? Yes  Can we respond through MyChart? Yes  Agent: Please be advised that Rx refills may take up to 3 business days. We ask that you follow-up with your pharmacy.

## 2023-05-25 MED ORDER — HYDROCORTISONE 2.5 % EX CREA: TOPICAL_CREAM | Freq: Two times a day (BID) | CUTANEOUS | 0 refills | Status: DC | PRN

## 2023-06-13 DIAGNOSIS — N3000 Acute cystitis without hematuria: Secondary | ICD-10-CM | POA: Diagnosis not present

## 2023-06-13 DIAGNOSIS — R350 Frequency of micturition: Secondary | ICD-10-CM | POA: Diagnosis not present

## 2023-06-18 ENCOUNTER — Other Ambulatory Visit: Payer: Self-pay | Admitting: Emergency Medicine

## 2023-06-25 ENCOUNTER — Other Ambulatory Visit: Payer: Self-pay | Admitting: Emergency Medicine

## 2023-06-30 ENCOUNTER — Telehealth: Payer: Self-pay | Admitting: Emergency Medicine

## 2023-07-02 DIAGNOSIS — N3020 Other chronic cystitis without hematuria: Secondary | ICD-10-CM | POA: Diagnosis not present

## 2023-07-03 ENCOUNTER — Telehealth: Payer: Self-pay | Admitting: Emergency Medicine

## 2023-07-03 ENCOUNTER — Other Ambulatory Visit: Payer: Self-pay | Admitting: Radiology

## 2023-07-03 DIAGNOSIS — M549 Dorsalgia, unspecified: Secondary | ICD-10-CM

## 2023-07-03 NOTE — Telephone Encounter (Signed)
 Copied from CRM [id number]. Topic: Clinical - Medication Question >> Jul 03, 2023 11:31 AM Allyne Areola wrote: Reason for CRM: Patient is calling regarding a metoprolol  tartrate (LOPRESSOR ) 25 MG tablet [[id number]] prescription she received. She states previously she was taking 2 50 MG tablets a day and would like to know why Dr.Sagardia decreased the dose.

## 2023-07-03 NOTE — Telephone Encounter (Signed)
 Copied from CRM [id number]. Topic: Referral - Request for Referral >> Jul 03, 2023 11:26 AM Allyne Areola wrote: Did the patient discuss referral with their provider in the last year? Yes (If No - schedule appointment) (If Yes - send message)  Appointment offered? Yes  Type of order/referral and detailed reason for visit: orthopedic (back pain)  Preference of office, provider, location: [facility], Dr.Beane   If referral order, have you been seen by this specialty before? No (If Yes, this issue or another issue? When? Where?  Can we respond through MyChart? Yes

## 2023-07-04 ENCOUNTER — Ambulatory Visit: Admitting: Emergency Medicine

## 2023-07-10 ENCOUNTER — Ambulatory Visit (INDEPENDENT_AMBULATORY_CARE_PROVIDER_SITE_OTHER): Admitting: Emergency Medicine

## 2023-07-10 ENCOUNTER — Encounter: Payer: Self-pay | Admitting: Emergency Medicine

## 2023-07-10 VITALS — BP 124/64 | HR 73 | Temp 97.9°F | Ht 66.0 in | Wt 180.0 lb

## 2023-07-10 DIAGNOSIS — M545 Low back pain, unspecified: Secondary | ICD-10-CM | POA: Diagnosis not present

## 2023-07-10 DIAGNOSIS — R110 Nausea: Secondary | ICD-10-CM | POA: Diagnosis not present

## 2023-07-10 DIAGNOSIS — N390 Urinary tract infection, site not specified: Secondary | ICD-10-CM

## 2023-07-10 DIAGNOSIS — I10 Essential (primary) hypertension: Secondary | ICD-10-CM

## 2023-07-10 DIAGNOSIS — R6881 Early satiety: Secondary | ICD-10-CM

## 2023-07-10 DIAGNOSIS — G8929 Other chronic pain: Secondary | ICD-10-CM

## 2023-07-10 LAB — COMPREHENSIVE METABOLIC PANEL WITH GFR
ALT: 18 U/L (ref 0–35)
AST: 18 U/L (ref 0–37)
Albumin: 4.3 g/dL (ref 3.5–5.2)
Alkaline Phosphatase: 49 U/L (ref 39–117)
BUN: 25 mg/dL — ABNORMAL HIGH (ref 6–23)
CO2: 30 meq/L (ref 19–32)
Calcium: 9.5 mg/dL (ref 8.4–10.5)
Chloride: 98 meq/L (ref 96–112)
Creatinine, Ser: 1 mg/dL (ref 0.40–1.20)
GFR: 55.25 mL/min — ABNORMAL LOW (ref >60.00)
Glucose, Bld: 100 mg/dL — ABNORMAL HIGH (ref 70–99)
Potassium: 4 meq/L (ref 3.5–5.1)
Sodium: 135 meq/L (ref 135–145)
Total Bilirubin: 0.3 mg/dL (ref 0.2–1.2)
Total Protein: 7.2 g/dL (ref 6.0–8.3)

## 2023-07-10 LAB — CBC WITH DIFFERENTIAL/PLATELET
Basophils Absolute: 0.1 10*3/uL (ref 0.0–0.1)
Basophils Relative: 1 % (ref 0.0–3.0)
Eosinophils Absolute: 0.3 10*3/uL (ref 0.0–0.7)
Eosinophils Relative: 3.2 % (ref 0.0–5.0)
HCT: 42 % (ref 36.0–46.0)
Hemoglobin: 13.8 g/dL (ref 12.0–15.0)
Lymphocytes Relative: 37.2 % (ref 12.0–46.0)
Lymphs Abs: 3.1 10*3/uL (ref 0.7–4.0)
MCHC: 33 g/dL (ref 30.0–36.0)
MCV: 87.7 fl (ref 78.0–100.0)
Monocytes Absolute: 1 10*3/uL (ref 0.1–1.0)
Monocytes Relative: 11.5 % (ref 3.0–12.0)
Neutro Abs: 3.9 10*3/uL (ref 1.4–7.7)
Neutrophils Relative %: 47.1 % (ref 43.0–77.0)
Platelets: 343 10*3/uL (ref 150.0–400.0)
RBC: 4.79 Mil/uL (ref 3.87–5.11)
RDW: 14.8 % (ref 11.5–15.5)
WBC: 8.3 10*3/uL (ref 4.0–10.5)

## 2023-07-10 LAB — LIPASE: Lipase: 26 U/L (ref 11.0–59.0)

## 2023-07-10 MED ORDER — HYDROCORTISONE 2.5 % EX CREA: TOPICAL_CREAM | Freq: Two times a day (BID) | CUTANEOUS | 0 refills | Status: DC | PRN

## 2023-07-10 NOTE — Assessment & Plan Note (Signed)
 New symptom.  Differential diagnosis discussed. Recommend GI evaluation and possible upper endoscopy

## 2023-07-10 NOTE — Assessment & Plan Note (Signed)
 New symptom per patient. Clinically stable.  Differential diagnosis discussed. No red flag signs or symptoms Recommend blood work today along with urine testing Symptom management discussed

## 2023-07-10 NOTE — Patient Instructions (Signed)
Nausea, Adult Nausea is feeling like you may vomit. Feeling like you may vomit is usually not serious, but it may be an early sign of a more serious medical problem. Vomiting is when stomach contents forcefully come out of your mouth. If you vomit, or if you are not able to drink enough fluids, you may not have enough water in your body (get dehydrated). If you do not have enough water in your body, you may: Feel tired. Feel thirsty. Have a dry mouth. Have cracked lips. Pee (urinate) less often. Older adults and people who have other diseases or a weak body defense system (immune system) have a higher risk of not having enough water in the body. The main goals of treating this condition are: To relieve your nausea. To ensure your nausea occurs less often. To prevent vomiting and losing too much fluid. Follow these instructions at home: Watch your symptoms for any changes. Tell your doctor about them. Eating and drinking     Take an ORS (oral rehydration solution). This is a drink that is sold at pharmacies and stores. Drink clear fluids in small amounts as you are able. These include: Water. Ice chips. Fruit juice that has water added (diluted fruit juice). Low-calorie sports drinks. Eat bland, easy-to-digest foods in small amounts as you are able, such as: Bananas. Applesauce. Rice. Low-fat (lean) meats. Toast. Crackers. Avoid drinking fluids that have a lot of sugar or caffeine in them. This includes energy drinks, sports drinks, and soda. Avoid alcohol. Avoid spicy or fatty foods. General instructions Take over-the-counter and prescription medicines only as told by your doctor. Rest at home while you get better. Drink enough fluid to keep your pee (urine) pale yellow. Take slow and deep breaths when you feel like you may vomit. Avoid food or things that have strong smells. Wash your hands often with soap and water for at least 20 seconds. If you cannot use soap and water,  use hand sanitizer. Make sure that everyone in your home washes their hands well and often. Keep all follow-up visits. Contact a doctor if: You feel worse. You feel like you may vomit and this lasts for more than 2 days. You vomit. You are not able to drink fluids without vomiting. You have new symptoms. You have a fever. You have a headache. You have muscle cramps. You have a rash. You have pain while peeing. You feel light-headed or dizzy. Get help right away if: You have pain in your chest, neck, arm, or jaw. You feel very weak or you faint. You have vomit that is bright red or looks like coffee grounds. You have bloody or black poop (stools) or poop that looks like tar. You have a very bad headache, a stiff neck, or both. You have very bad pain, cramping, or bloating in your belly (abdomen). You have trouble breathing or you are breathing very quickly. Your heart is beating very quickly. Your skin feels cold and clammy. You feel confused. You have signs of losing too much water in your body, such as: Dark pee, very little pee, or no pee. Cracked lips. Dry mouth. Sunken eyes. Sleepiness. Weakness. These symptoms may be an emergency. Get help right away. Call 911. Do not wait to see if the symptoms will go away. Do not drive yourself to the hospital. Summary Nausea is feeling like you are about vomit. If you vomit, or if you are not able to drink enough fluids, you may not have enough water in   your body (get dehydrated). Eat and drink what your doctor tells you. Take over-the-counter and prescription medicines only as told by your doctor. Contact a doctor right away if your symptoms get worse or you have new symptoms. Keep all follow-up visits. This information is not intended to replace advice given to you by your health care provider. Make sure you discuss any questions you have with your health care provider. Document Revised: 07/16/2020 Document Reviewed:  07/16/2020 Elsevier Patient Education  2024 [employer]  

## 2023-07-10 NOTE — Assessment & Plan Note (Signed)
 BP Readings from Last 3 Encounters:  07/10/23 124/64  03/07/23 128/68  02/13/23 134/72  Well-controlled on multiple medications Continue metoprolol  tartrate 25 mg twice a day, irbesartan  300 mg once a day, hydrochlorothiazide  25 mg daily, amlodipine  5 mg daily and clonidine  0.1 mg twice a day Cardiovascular risks associated with hypertension discussed Diet and nutrition discussed

## 2023-07-10 NOTE — Progress Notes (Signed)
 [name] 73 y.o.   Chief Complaint  Patient presents with   Referral    Patient here for an ortho referral for an old back injury. Patient also mentions for the past couple of days she feels like she has a bladder infection she does have burning, and the urgency, she said she did see her urologist last week and did have the infection and was given 2 abx. Patient states also having a full feeling when she eats. She states she's been real nausea and fatigue that started over the weekend     HISTORY OF PRESENT ILLNESS: This is a 73 y.o. female with several complaints today: 1.  Requesting orthopedic referral due to old back injury and chronic low back pain 2.  History of recurrent UTIs.  Sees urologist on a regular basis.  Recently treated with ciprofloxacin 500 mg twice a day for 7 days followed by Macrodantin prophylactic bedtime dose.  Still having some pelvic pain and urinary symptoms. 3.  Feeling nauseous with early satiety.  Recent onset. No other complaints or medical concerns today  HPI   Prior to Admission medications   Medication Sig Start Date End Date Taking? Authorizing Provider  acetaminophen  (TYLENOL ) 500 MG tablet Take 1,000 mg by mouth every 6 (six) hours as needed for mild pain.   Yes [provider]  amLODipine  (NORVASC ) 5 MG tablet Take 1 tablet (5 mg total) by mouth daily. 04/12/23  Yes Dyland Panuco, Isidro Margo, MD  baclofen  (LIORESAL ) 10 MG tablet TAKE 1 TABLET BY MOUTH EVERYDAY AT BEDTIME 06/18/23  Yes Curry Dulski, Isidro Margo, MD  cloNIDine  (CATAPRES ) 0.1 MG tablet Take 1 tablet (0.1 mg total) by mouth 2 (two) times daily. Keep scheduled appt for future reills 04/10/23  Yes Evalie Hargraves, Isidro Margo, MD  fenofibrate  54 MG tablet TAKE 1 TABLET BY MOUTH EVERY DAY WITH FOOD 06/18/23  Yes Lael Pilch, Isidro Margo, MD  hydrochlorothiazide  (HYDRODIURIL ) 25 MG tablet TAKE 1 TABLET (25 MG TOTAL) BY MOUTH DAILY. 06/18/23  Yes Khrystina Bonnes, Isidro Margo, MD  hydrocortisone  2.5 % cream  Apply topically 2 (two) times daily as needed. Use up to 14 days 05/25/23  Yes Praveen Coia, Isidro Margo, MD  irbesartan  (AVAPRO ) 300 MG tablet Take 1 tablet (300 mg total) by mouth daily. 10/25/22  Yes Azeez Dunker, Isidro Margo, MD  levETIRAcetam  (KEPPRA ) 500 MG tablet TAKE 1 TABLET BY MOUTH TWICE A DAY 06/25/23  Yes Roshun Klingensmith Jose, MD  metoprolol  tartrate (LOPRESSOR ) 25 MG tablet TAKE 2 TABLETS BY MOUTH 2 TIMES DAILY. 07/01/23  Yes Alexy Bringle, Isidro Margo, MD  naproxen sodium (ALEVE) 220 MG tablet Take 440 mg by mouth 2 (two) times daily as needed (pain).   Yes [provider]  omeprazole  (PRILOSEC) 40 MG capsule TAKE 1 CAPSULE (40 MG TOTAL) BY MOUTH DAILY. 04/04/23  Yes Tahji [place], Isidro Margo, MD  traZODone  (DESYREL ) 100 MG tablet TAKE 1 TABLET BY MOUTH EVERYDAY AT BEDTIME 01/23/23  Yes Dalaya Suppa, Isidro Margo, MD  HYDROcodone  bit-homatropine [facility]) 5-1.5 MG/5ML syrup Take 5 mLs by mouth at bedtime as needed for cough. Patient not taking: Reported on 07/10/2023 02/13/23   Elvira Hammersmith, MD    Allergies  Allergen Reactions   Barium-Containing Compounds Nausea And Vomiting   Egg-Derived Products Nausea And Vomiting   Red Dye #40 (Allura Red) Nausea And Vomiting    Patient Active Problem List   Diagnosis Date Noted   Viral respiratory illness 03/07/2023   Persistent cough 03/07/2023   Acute cough 02/13/2023   Lower  respiratory infection 02/13/2023   Dysuria 08/14/2022   Breast lesion 04/05/2022   Bowel dysfunction 03/02/2022   Abnormal kidney function 11/09/2021   Anemia 11/09/2021   Adjustment disorder with depressed mood 07/13/2019   Chronic diarrhea 07/07/2019   Vitamin D deficiency 02/15/2011   Fibromyalgia 02/14/2011   High risk medication use 02/14/2011   Hypertension 01/07/2011   GERD (gastroesophageal reflux disease) 12/30/2010   Anxiety 09/01/2010   Nonruptured cerebral aneurysm 07/01/2010   Ulnar neuropathy 06/03/2010   Depression 06/03/2010   Insomnia 06/03/2010    Seizure (HCC) 06/03/2010   Acute UTI 06/03/2010   Arthritis 06/03/2010    Past Medical History:  Diagnosis Date   Aneurysm (HCC)    brain   Anxiety    Arthritis    Depression    Fibromyalgia    GERD (gastroesophageal reflux disease)    Hyperlipidemia    pt. thought she was placed on medication for cholesterol but does not know for sure.   Hypertension    Insomnia    Seizures (HCC)    [phone]    Past Surgical History:  Procedure Laterality Date   ABDOMINAL HYSTERECTOMY     BREAST CYST EXCISION Right    CEREBRAL ANEURYSM REPAIR     CEREBRAL ANEURYSM REPAIR     clipped    [place]OSCOPY     12+ years ago [place]   POLYPECTOMY      Social History   Socioeconomic History   Marital status: Divorced    Spouse name: Not on file   Number of children: Not on file   Years of education: Not on file   Highest education level: Not on file  Occupational History   Not on file  Tobacco Use   Smoking status: Former    Current packs/day: 0.00    Types: Cigarettes    Quit date: 1985    Years since quitting: 40.4   Smokeless tobacco: Never  Substance and Sexual Activity   Alcohol use: No   Drug use: No   Sexual activity: Not Currently  Other Topics Concern   Not on file  Social History Narrative   Not on file   Social Drivers of Health   Financial Resource Strain: Not on file  Food Insecurity: Not on file  Transportation Needs: Not on file  Physical Activity: Not on file  Stress: Not on file  Social Connections: Not on file  Intimate Partner Violence: Not on file    Family History  Problem Relation Age of Onset   Heart disease Mother    Heart disease Father    Breast cancer Maternal Aunt        31s   Colon cancer Paternal Grandfather    Cancer Brother    Arthritis Brother    Esophageal cancer Son    Colon polyps Neg Hx    Rectal cancer Neg Hx    Stomach cancer Neg Hx      Review of Systems  Constitutional: Negative.  Negative for chills, fever and  weight loss.  HENT: Negative.  Negative for congestion and sore throat.   Respiratory: Negative.  Negative for cough and shortness of breath.   Cardiovascular: Negative.  Negative for chest pain and palpitations.  Gastrointestinal:  Positive for nausea. Negative for abdominal pain and vomiting.       Early satiety  Musculoskeletal:  Positive for back pain.  Skin: Negative.  Negative for rash.  Neurological: Negative.  Negative for dizziness and headaches.  All other systems  reviewed and are negative.   Vitals:   07/10/23 1504  BP: 124/64  Pulse: 73  Temp: 97.9 F (36.6 C)  SpO2: 95%    Physical Exam Vitals reviewed.  Constitutional:      Appearance: Normal appearance.  HENT:     Head: Normocephalic.     Mouth/Throat:     Mouth: Mucous membranes are moist.     Pharynx: Oropharynx is clear.   Eyes:     Extraocular Movements: Extraocular movements intact.     Conjunctiva/sclera: Conjunctivae normal.     Pupils: Pupils are equal, round, and reactive to light.    Cardiovascular:     Rate and Rhythm: Normal rate.     Pulses: Normal pulses.     Heart sounds: Normal heart sounds.  Pulmonary:     Effort: Pulmonary effort is normal.     Breath sounds: Normal breath sounds.  Abdominal:     Palpations: Abdomen is soft.     Tenderness: There is no abdominal tenderness.   Musculoskeletal:     Cervical back: No tenderness.  Lymphadenopathy:     Cervical: No cervical adenopathy.   Skin:    General: Skin is warm and dry.   Neurological:     Mental Status: She is alert and oriented to person, place, and time.   Psychiatric:        Mood and Affect: Mood normal.        Behavior: Behavior normal.      ASSESSMENT & PLAN: A total of 43 minutes was spent with the patient and counseling/coordination of care regarding preparing for this visit, review of most recent office visit notes, review of multiple chronic medical conditions and their management, review of all  medications, review of most recent bloodwork results, review of health maintenance items, education on nutrition, prognosis, documentation, and need for follow up.   Problem List Items Addressed This Visit       Cardiovascular and Mediastinum   Hypertension (Chronic)   BP Readings from Last 3 Encounters:  07/10/23 124/64  03/07/23 128/68  02/13/23 134/72  Well-controlled on multiple medications Continue metoprolol  tartrate 25 mg twice a day, irbesartan  300 mg once a day, hydrochlorothiazide  25 mg daily, amlodipine  5 mg daily and clonidine  0.1 mg twice a day Cardiovascular risks associated with hypertension discussed Diet and nutrition discussed        Genitourinary   Recurrent UTI   Has history of recurrent UTIs resistant to multiple antibiotics Clinically stable.  No signs of pyelonephritis Recently completed course of Cipro followed by prophylactic doses of Macrodantin Still having lower urinary tract symptoms We will repeat urinalysis and urine culture today Needs to follow-up with her urologist      Relevant Orders   Urine Culture   Urinalysis     Other   Chronic bilateral low back pain without sciatica   Relevant Orders   Ambulatory referral to Orthopedic Surgery   Nausea - Primary   New symptom per patient. Clinically stable.  Differential diagnosis discussed. No red flag signs or symptoms Recommend blood work today along with urine testing Symptom management discussed      Relevant Orders   Ambulatory referral to Gastroenterology   Urine Culture   CBC with Differential/Platelet   Comprehensive metabolic panel with GFR   Lipase   Urinalysis   Early satiety   New symptom.  Differential diagnosis discussed. Recommend GI evaluation and possible upper endoscopy      Relevant Orders   Ambulatory  referral to Gastroenterology   CBC with Differential/Platelet   Lipase   Patient Instructions  Nausea, Adult Nausea is feeling like you may vomit. Feeling like  you may vomit is usually not serious, but it may be an early sign of a more serious medical problem. Vomiting is when stomach contents forcefully come out of your mouth. If you vomit, or if you are not able to drink enough fluids, you may not have enough water in your body (get dehydrated). If you do not have enough water in your body, you may: Feel tired. Feel thirsty. Have a dry mouth. Have cracked lips. Pee (urinate) less often. Older adults and people who have other diseases or a weak body defense system (immune system) have a higher risk of not having enough water in the body. The main goals of treating this condition are: To relieve your nausea. To ensure your nausea occurs less often. To prevent vomiting and losing too much fluid. Follow these instructions at home: Watch your symptoms for any changes. Tell your doctor about them. Eating and drinking     Take an ORS (oral rehydration solution). This is a drink that is sold at pharmacies and stores. Drink clear fluids in small amounts as you are able. These include: Water. Ice chips. Fruit juice that has water added (diluted fruit juice). Low-calorie sports drinks. Eat bland, easy-to-digest foods in small amounts as you are able, such as: Bananas. Applesauce. Rice. Low-fat (lean) meats. Toast. Crackers. Avoid drinking fluids that have a lot of sugar or caffeine in them. This includes energy drinks, sports drinks, and soda. Avoid alcohol. Avoid spicy or fatty foods. General instructions Take over-the-counter and prescription medicines only as told by your doctor. Rest at home while you get better. Drink enough fluid to keep your pee (urine) pale yellow. Take slow and deep breaths when you feel like you may vomit. Avoid food or things that have strong smells. Wash your hands often with soap and water for at least 20 seconds. If you cannot use soap and water, use hand sanitizer. Make sure that everyone in your home washes  their hands well and often. Keep all follow-up visits. Contact a doctor if: You feel worse. You feel like you may vomit and this lasts for more than 2 days. You vomit. You are not able to drink fluids without vomiting. You have new symptoms. You have a fever. You have a headache. You have muscle cramps. You have a rash. You have pain while peeing. You feel light-headed or dizzy. Get help right away if: You have pain in your chest, neck, arm, or jaw. You feel very weak or you faint. You have vomit that is bright red or looks like coffee grounds. You have bloody or black poop (stools) or poop that looks like tar. You have a very bad headache, a stiff neck, or both. You have very bad pain, cramping, or bloating in your belly (abdomen). You have trouble breathing or you are breathing very quickly. Your heart is beating very quickly. Your skin feels cold and clammy. You feel confused. You have signs of losing too much water in your body, such as: Dark pee, very little pee, or no pee. Cracked lips. Dry mouth. Sunken eyes. Sleepiness. Weakness. These symptoms may be an emergency. Get help right away. Call 911. Do not wait to see if the symptoms will go away. Do not drive yourself to the hospital. Summary Nausea is feeling like you are about vomit. If you vomit,  or if you are not able to drink enough fluids, you may not have enough water in your body (get dehydrated). Eat and drink what your doctor tells you. Take over-the-counter and prescription medicines only as told by your doctor. Contact a doctor right away if your symptoms get worse or you have new symptoms. Keep all follow-up visits. This information is not intended to replace advice given to you by your health care provider. Make sure you discuss any questions you have with your health care provider. Document Revised: 07/16/2020 Document Reviewed: 07/16/2020 Elsevier Patient Education  2024 Elsevier Inc.    Maryagnes Small, MD [facility]

## 2023-07-10 NOTE — Assessment & Plan Note (Signed)
 Has history of recurrent UTIs resistant to multiple antibiotics Clinically stable.  No signs of pyelonephritis Recently completed course of Cipro followed by prophylactic doses of Macrodantin Still having lower urinary tract symptoms We will repeat urinalysis and urine culture today Needs to follow-up with her urologist

## 2023-07-11 ENCOUNTER — Ambulatory Visit: Payer: Self-pay | Admitting: Emergency Medicine

## 2023-07-11 DIAGNOSIS — N390 Urinary tract infection, site not specified: Secondary | ICD-10-CM

## 2023-07-11 LAB — URINALYSIS, ROUTINE W REFLEX MICROSCOPIC
Bilirubin Urine: NEGATIVE
Hgb urine dipstick: NEGATIVE
Ketones, ur: NEGATIVE
Nitrite: POSITIVE — AB
RBC / HPF: NONE SEEN (ref 0–?)
Specific Gravity, Urine: 1.015 (ref 1.000–1.030)
Total Protein, Urine: NEGATIVE
Urine Glucose: NEGATIVE
Urobilinogen, UA: 0.2 (ref 0.0–1.0)
pH: 6 (ref 5.0–8.0)

## 2023-07-13 LAB — URINE CULTURE

## 2023-07-13 MED ORDER — CEFUROXIME AXETIL 500 MG PO TABS: 500.0000 mg | ORAL_TABLET | Freq: Two times a day (BID) | ORAL | 0 refills | Status: AC

## 2023-07-17 ENCOUNTER — Other Ambulatory Visit: Payer: Self-pay | Admitting: Emergency Medicine

## 2023-08-08 ENCOUNTER — Encounter: Payer: Self-pay | Admitting: Gastroenterology

## 2023-08-08 ENCOUNTER — Encounter: Payer: Self-pay | Admitting: Physical Medicine and Rehabilitation

## 2023-08-08 ENCOUNTER — Ambulatory Visit: Admitting: Physical Medicine and Rehabilitation

## 2023-08-08 DIAGNOSIS — M533 Sacrococcygeal disorders, not elsewhere classified: Secondary | ICD-10-CM | POA: Diagnosis not present

## 2023-08-08 DIAGNOSIS — G894 Chronic pain syndrome: Secondary | ICD-10-CM | POA: Diagnosis not present

## 2023-08-08 DIAGNOSIS — M797 Fibromyalgia: Secondary | ICD-10-CM | POA: Diagnosis not present

## 2023-08-08 NOTE — Progress Notes (Signed)
 [name] - 73 y.o. female MRN [id number]  Date of birth: [birth year]  Office Visit Note: Visit Date: 08/08/2023 PCP: Purcell Emil Schanz, MD Referred by: Purcell Emil Schanz, *  Subjective: Chief Complaint  Patient presents with   Spine - Pain   HPI: [name] is a 73 y.o. female who comes in today per the request of Dr. Emil Purcell for evaluation of chronic, worsening and severe coccyx pain. States she fell onto cement floor in 1970's and sustained coccyx fracture. Her pain has worsened over the last 5 years. Her pain worsens when laying flat to sleep and with prolonged sitting. She describes her pain as sore and aching sensation, currently rates as 7 out of 10. Some relief of pain with home exercise regimen, rest and use of medications. No history of formal physical therapy. Radiographs of sacrum/coccyx from 2022 shows no evidence of fractures or bone lesions. She reports history of coccyx injection in the past, states injection was very painful, she does not wish to continue with injection therapy. No real lower back;leg pain at this time. Patient denies focal weakness, numbness and tingling. No recent trauma or falls.      Review of Systems  Musculoskeletal:  Positive for joint pain and myalgias.  Neurological:  Negative for tingling, sensory change, focal weakness and weakness.  All other systems reviewed and are negative.  Otherwise per HPI.  Assessment & Plan: Visit Diagnoses:    ICD-10-CM   1. Coccydynia  M53.3 MR PELVIS WO CONTRAST    2. Fibromyalgia  M79.7 MR PELVIS WO CONTRAST    3. Chronic pain syndrome  G89.4 MR PELVIS WO CONTRAST       Plan: Findings:  Chronic, worsening and severe coccyx pain. No pain to lower back/legs. Patient continues to have severe pain despite good conservative therapies such as home exercise regimen, rest and use of medications. Patients clinical presentation and exam are consistent with coccydynia. We discussed treatment plan in  detail today. Given the chronicity of her symptoms and continued pain I placed order for pelvic MRI imaging. I also placed order for pelvic physical therapy at [facility]. Patient is adamate about avoid further injections. Would consider referral to more comprehensive pain management practice, could look at sympathetic blocks. She has no questions at this time. Will see her back for MRI review. No red flag symptoms noted upon exam today.     Meds & Orders: No orders of the defined types were placed in this encounter.   Orders Placed This Encounter  Procedures   MR PELVIS WO CONTRAST    Follow-up: Return for Pelvic MRI follow up.   Procedures: No procedures performed      Clinical History: No specialty comments available.   She reports that she quit smoking about 40 years ago. She has never used smokeless tobacco.  Recent Labs    08/14/22 1516  HGBA1C 5.8    Objective:  VS:  HT:    WT:   BMI:     BP:   HR: bpm  TEMP: ( )  RESP:  Physical Exam Vitals and nursing note reviewed.  HENT:     Head: Normocephalic and atraumatic.     Right Ear: External ear normal.     Left Ear: External ear normal.     Nose: Nose normal.     Mouth/Throat:     Mouth: Mucous membranes are moist.  Eyes:     Extraocular Movements: Extraocular movements intact.  Cardiovascular:  Rate and Rhythm: Normal rate.     Pulses: Normal pulses.  Pulmonary:     Effort: Pulmonary effort is normal.  Abdominal:     General: Abdomen is flat. There is no distension.  Musculoskeletal:        General: Tenderness present.     Cervical back: Normal range of motion.     Comments: Tenderness noted upon palpation of sacrocynggeal joint. Patient rises from seated position to standing without difficulty. Good lumbar range of motion. No pain noted with facet loading. 5/5 strength noted with bilateral hip flexion, knee flexion/extension, ankle dorsiflexion/plantarflexion and EHL. No clonus noted bilaterally. No pain  upon palpation of greater trochanters. No pain with internal/external rotation of bilateral hips. Sensation intact bilaterally. Negative slump test bilaterally. Ambulates without aid, gait steady.     Skin:    General: Skin is warm and dry.     Capillary Refill: Capillary refill takes less than 2 seconds.  Neurological:     General: No focal deficit present.     Mental Status: She is alert and oriented to person, place, and time.  Psychiatric:        Mood and Affect: Mood normal.        Behavior: Behavior normal.     Ortho Exam  Imaging: No results found.  Past Medical/Family/Surgical/Social History: Medications & Allergies reviewed per EMR, new medications updated. Patient Active Problem List   Diagnosis Date Noted   Chronic bilateral low back pain without sciatica 07/10/2023   Nausea 07/10/2023   Early satiety 07/10/2023   Persistent cough 03/07/2023   Dysuria 08/14/2022   Breast lesion 04/05/2022   Abnormal kidney function 11/09/2021   Anemia 11/09/2021   Adjustment disorder with depressed mood 07/13/2019   Chronic diarrhea 07/07/2019   Vitamin D deficiency 02/15/2011   Fibromyalgia 02/14/2011   High risk medication use 02/14/2011   Hypertension 01/07/2011   GERD (gastroesophageal reflux disease) 12/30/2010   Anxiety 09/01/2010   Nonruptured cerebral aneurysm 07/01/2010   Ulnar neuropathy 06/03/2010   Depression 06/03/2010   Insomnia 06/03/2010   Seizure (HCC) 06/03/2010   Recurrent UTI 06/03/2010   Arthritis 06/03/2010   Past Medical History:  Diagnosis Date   Aneurysm (HCC)    brain   Anxiety    Arthritis    Depression    Fibromyalgia    GERD (gastroesophageal reflux disease)    Hyperlipidemia    pt. thought she was placed on medication for cholesterol but does not know for sure.   Hypertension    Insomnia    Seizures (HCC)    [phone]   Family History  Problem Relation Age of Onset   Heart disease Mother    Heart disease Father    Breast cancer  Maternal Aunt        46s   Colon cancer Paternal Grandfather    Cancer Brother    Arthritis Brother    Esophageal cancer Son    Colon polyps Neg Hx    Rectal cancer Neg Hx    Stomach cancer Neg Hx    Past Surgical History:  Procedure Laterality Date   ABDOMINAL HYSTERECTOMY     BREAST CYST EXCISION Right    CEREBRAL ANEURYSM REPAIR     CEREBRAL ANEURYSM REPAIR     clipped    [place]OSCOPY     12+ years ago [place]   POLYPECTOMY     Social History   Occupational History   Not on file  Tobacco Use   Smoking  status: Former    Current packs/day: 0.00    Types: Cigarettes    Quit date: 1985    Years since quitting: 40.5   Smokeless tobacco: Never  Substance and Sexual Activity   Alcohol use: No   Drug use: No   Sexual activity: Not Currently

## 2023-08-08 NOTE — Progress Notes (Signed)
 Pain Scale   Average Pain 7 Patient advising she fractured her coccyx bone x 25 to 30 years ago and she is having pain when laying down or sitting.        +Driver, -BT, -Dye Allergies.

## 2023-08-28 ENCOUNTER — Telehealth: Payer: Self-pay | Admitting: Emergency Medicine

## 2023-08-28 NOTE — Telephone Encounter (Signed)
 Copied from CRM #[id number]. Topic: Clinical - Medication Refill >> Aug 28, 2023 11:10 AM Pinkey ORN wrote: Medication: hydrocortisone  2.5 % cream  Has the patient contacted their pharmacy? Yes (Agent: If no, request that the patient contact the pharmacy for the refill. If patient does not wish to contact the pharmacy document the reason why and proceed with request.) (Agent: If yes, when and what did the pharmacy advise?)  This is the patient's preferred pharmacy:  [id number] - [place], [place] - [address] AT [address] OF [facility] [address] [place] [address] Phone: [phone] Fax: [phone]  Is this the correct pharmacy for this prescription? Yes If no, delete pharmacy and type the correct one.   Has the prescription been filled recently? No  Is the patient out of the medication? Yes  Has the patient been seen for an appointment in the last year OR does the patient have an upcoming appointment? Yes  Can we respond through MyChart? Yes  Agent: Please be advised that Rx refills may take up to 3 business days. We ask that you follow-up with your pharmacy.

## 2023-08-31 ENCOUNTER — Other Ambulatory Visit: Payer: Self-pay | Admitting: Radiology

## 2023-08-31 MED ORDER — HYDROCORTISONE 2.5 % EX CREA: TOPICAL_CREAM | Freq: Two times a day (BID) | CUTANEOUS | 0 refills | Status: DC | PRN

## 2023-08-31 NOTE — Telephone Encounter (Signed)
 Copied from CRM [id number]. Topic: Clinical - Medication Question >> Aug 31, 2023 11:25 AM Deleta RAMAN wrote: Reason for CRM: status on medication refill Hydrocortisone  2.5 % cream. Notified that request is still pending

## 2023-08-31 NOTE — Telephone Encounter (Signed)
Refill request has been sent.

## 2023-09-14 ENCOUNTER — Encounter: Payer: Self-pay | Admitting: Physical Medicine and Rehabilitation

## 2023-09-20 ENCOUNTER — Ambulatory Visit: Admission: RE | Admit: 2023-09-20 | Source: Ambulatory Visit

## 2023-09-21 ENCOUNTER — Other Ambulatory Visit: Payer: Self-pay | Admitting: Emergency Medicine

## 2023-09-21 DIAGNOSIS — I10 Essential (primary) hypertension: Secondary | ICD-10-CM

## 2023-09-25 ENCOUNTER — Telehealth: Payer: Self-pay

## 2023-09-25 ENCOUNTER — Other Ambulatory Visit: Payer: Self-pay | Admitting: Emergency Medicine

## 2023-09-25 NOTE — Telephone Encounter (Signed)
 Copied from CRM #[id number]. Topic: General - Other >> Sep 25, 2023 12:32 PM [name] wrote: Reason for CRM: Patient is calling in stating that she can not get her MRI at [facility] due to her chart stating that she had a previous aneurysm. Patient is not sure what to do and is asking if Dr. Lebron nurse can call her to help discuss this issue she is having.

## 2023-09-26 NOTE — Telephone Encounter (Signed)
 Returned patients call LVM for patient to call back regarding her MRI questions

## 2023-10-02 ENCOUNTER — Encounter: Payer: Self-pay | Admitting: Gastroenterology

## 2023-10-02 ENCOUNTER — Ambulatory Visit: Admitting: Gastroenterology

## 2023-10-02 VITALS — BP 124/70 | HR 60 | Ht 66.0 in | Wt 182.0 lb

## 2023-10-02 DIAGNOSIS — R140 Abdominal distension (gaseous): Secondary | ICD-10-CM

## 2023-10-02 DIAGNOSIS — R143 Flatulence: Secondary | ICD-10-CM

## 2023-10-02 DIAGNOSIS — K219 Gastro-esophageal reflux disease without esophagitis: Secondary | ICD-10-CM

## 2023-10-02 DIAGNOSIS — R194 Change in bowel habit: Secondary | ICD-10-CM

## 2023-10-02 DIAGNOSIS — R1319 Other dysphagia: Secondary | ICD-10-CM | POA: Diagnosis not present

## 2023-10-02 DIAGNOSIS — Z800 Family history of malignant neoplasm of digestive organs: Secondary | ICD-10-CM | POA: Diagnosis not present

## 2023-10-02 NOTE — Patient Instructions (Addendum)
 GERD Recommend GERD diet Continue omeprazole  40 mg po daily   Gas/bloat  Recommend low fodmap diet   You have been scheduled for an endoscopy. Please follow written instructions given to you at your visit today.  If you use inhalers (even only as needed), please bring them with you on the day of your procedure.  If you take any of the following medications, they will need to be adjusted prior to your procedure:   DO NOT TAKE 7 DAYS PRIOR TO TEST- Trulicity (dulaglutide) Ozempic, Wegovy (semaglutide) Mounjaro (tirzepatide) Bydureon Bcise (exanatide extended release)  DO NOT TAKE 1 DAY PRIOR TO YOUR TEST Rybelsus (semaglutide) Adlyxin (lixisenatide) Victoza (liraglutide) Byetta (exanatide) ___________________________________________________________________________   Due to recent changes in healthcare laws, you may see the results of your imaging and laboratory studies on MyChart before your provider has had a chance to review them.  We understand that in some cases there may be results that are confusing or concerning to you. Not all laboratory results come back in the same time frame and the provider may be waiting for multiple results in order to interpret others.  Please give us  48 hours in order for your provider to thoroughly review all the results before contacting the office for clarification of your results.   _______________________________________________________  If your blood pressure at your visit was 140/90 or greater, please contact your primary care physician to follow up on this.  _______________________________________________________  If you are age 80 or older, your body mass index should be between 23-30. Your Body mass index is 29.38 kg/m. If this is out of the aforementioned range listed, please consider follow up with your Primary Care Provider.  If you are age 75 or younger, your body mass index should be between 19-25. Your Body mass index is 29.38  kg/m. If this is out of the aformentioned range listed, please consider follow up with your Primary Care Provider.   ________________________________________________________  The [facility] providers would like to encourage you to use MYCHART to communicate with providers for non-urgent requests or questions.  Due to long hold times on the telephone, sending your provider a message by [facility] may be a faster and more efficient way to get a response.  Please allow 48 business hours for a response.  Please remember that this is for non-urgent requests.  _______________________________________________________  [facility] is using a team-based approach to care.  Your team is made up of your doctor and two to three APPS. Our APPS (Nurse Practitioners and Physician Assistants) work with your physician to ensure care continuity for you. They are fully qualified to address your health concerns and develop a treatment plan. They communicate directly with your gastroenterologist to care for you. Seeing the Advanced Practice Practitioners on your physician's team can help you by facilitating care more promptly, often allowing for earlier appointments, access to diagnostic testing, procedures, and other specialty referrals.   Thank you for trusting me with your gastrointestinal care. Deanna May, FNP-C

## 2023-10-02 NOTE — Progress Notes (Signed)
 Chief Complaint:nausea, early satiety Primary GI Doctor: (previously Dr. Teressa) Dr. San  HPI:  Patient is a  73  year old female patient with past medical history of anxiety, depression, FB, and GERD, who was referred to me by Purcell Emil Schanz, * on 07/10/23 for a evaluation of nausea, early satiety .    Interval History  Patient presents for evaluation of uncontrolled GERD, nausea, and esophageal dysphagia.  Patient has history of GERD and taking omeprazole  40mg  po daily. She reports this controls her symptoms most of the time.  When she has breakthrough symptoms patient will take over-the-counter Prilosec which she states she feels works better she has intermittent issues with esophageal dysphagia with solids and/or pills only. She has never had EGD.  She has also had some intermittent nausea that occurs few times a month. Appetite is good. No weight loss. She notes she does walk during the week for exercise.  She has a lot of bloating and gas. She notes history of constipation, but as of recent she has frequent loose stools. She will have on average 1-2 per day.  She avoids dairy products due to lactose intolerance.  Former smoker for 10 years, stopped in 1985. No alcohol use.   She takes OTC advil , very seldom use.   History of seizure on Keppra , last seizure was years ago.   History of brain aneurysm 1998 with titanium clip.   Patient's family history includes: grandfather with colon CA, son with esophageal CA  Wt Readings from Last 3 Encounters:  10/02/23 182 lb (82.6 kg)  07/10/23 180 lb (81.6 kg)  03/07/23 182 lb (82.6 kg)     Past Medical History:  Diagnosis Date   Aneurysm (HCC)    brain   Anxiety    Arthritis    Depression    Fibromyalgia    GERD (gastroesophageal reflux disease)    Hyperlipidemia    pt. thought she was placed on medication for cholesterol but does not know for sure.   Hypertension    Insomnia    Seizures (HCC)    [phone]     Past Surgical History:  Procedure Laterality Date   ABDOMINAL HYSTERECTOMY     BREAST CYST EXCISION Right    CEREBRAL ANEURYSM REPAIR     CEREBRAL ANEURYSM REPAIR     clipped    COLONOSCOPY     12+ years ago [place]   POLYPECTOMY      Current Outpatient Medications  Medication Sig Dispense Refill   acetaminophen  (TYLENOL ) 500 MG tablet Take 1,000 mg by mouth every 6 (six) hours as needed for mild pain.     amLODipine  (NORVASC ) 5 MG tablet Take 1 tablet (5 mg total) by mouth daily. 90 tablet 3   baclofen  (LIORESAL ) 10 MG tablet TAKE 1 TABLET BY MOUTH EVERYDAY AT BEDTIME 90 tablet 1   cloNIDine  (CATAPRES ) 0.1 MG tablet TAKE 1 TABLET (0.1 MG TOTAL) BY MOUTH 2 (TWO) TIMES DAILY. KEEP SCHEDULED APPT FOR FUTURE REILLS 180 tablet 2   fenofibrate  54 MG tablet TAKE 1 TABLET BY MOUTH EVERY DAY WITH FOOD 90 tablet 3   hydrochlorothiazide  (HYDRODIURIL ) 25 MG tablet TAKE 1 TABLET (25 MG TOTAL) BY MOUTH DAILY. 90 tablet 3   HYDROcodone  bit-homatropine (HYCODAN) 5-1.5 MG/5ML syrup Take 5 mLs by mouth at bedtime as needed for cough. 120 mL 0   hydrocortisone  2.5 % cream Apply topically 2 (two) times daily as needed. Use up to 14 days 28 g 0  irbesartan  (AVAPRO ) 300 MG tablet TAKE 1 TABLET BY MOUTH EVERY DAY 90 tablet 3   levETIRAcetam  (KEPPRA ) 500 MG tablet TAKE 1 TABLET BY MOUTH TWICE A DAY 180 tablet 2   metoprolol  tartrate (LOPRESSOR ) 25 MG tablet TAKE 2 TABLETS BY MOUTH 2 TIMES DAILY. 360 tablet 1   naproxen sodium (ALEVE) 220 MG tablet Take 440 mg by mouth 2 (two) times daily as needed (pain).     omeprazole  (PRILOSEC) 40 MG capsule TAKE 1 CAPSULE (40 MG TOTAL) BY MOUTH DAILY. 90 capsule 2   traZODone  (DESYREL ) 100 MG tablet TAKE 1 TABLET BY MOUTH EVERYDAY AT BEDTIME 90 tablet 1   No current facility-administered medications for this visit.    Allergies as of 10/02/2023 - Review Complete 10/02/2023  Allergen Reaction Noted   Barium-containing compounds Nausea And Vomiting 04/05/2017    Egg-derived products Nausea And Vomiting 07/08/2019   Red dye #40 (allura red) Nausea And Vomiting 07/07/2019    Family History  Problem Relation Age of Onset   Heart disease Mother    Heart disease Father    Breast cancer Maternal Aunt        77s   Colon cancer Paternal Grandfather    Cancer Brother    Arthritis Brother    Esophageal cancer Son    Colon polyps Neg Hx    Rectal cancer Neg Hx    Stomach cancer Neg Hx     Review of Systems:    Constitutional: No weight loss, fever, chills, weakness or fatigue HEENT: Eyes: No change in vision               Ears, Nose, Throat:  No change in hearing or congestion Skin: No rash or itching Cardiovascular: No chest pain, chest pressure or palpitations   Respiratory: No SOB or cough Gastrointestinal: See HPI and otherwise negative Genitourinary: No dysuria or change in urinary frequency Neurological: No headache, dizziness or syncope Musculoskeletal: No new muscle or joint pain Hematologic: No bleeding or bruising Psychiatric: No history of depression or anxiety    Physical Exam:  Vital signs: BP 124/70   Pulse 60   Ht 5' 6 (1.676 m)   Wt 182 lb (82.6 kg)   BMI 29.38 kg/m   Constitutional:   Pleasant female appears to be in NAD, Well developed, Well nourished, alert and cooperative Throat: Oral cavity and pharynx without inflammation, swelling or lesion.  Respiratory: Respirations even and unlabored. Lungs clear to auscultation bilaterally.   No wheezes, crackles, or rhonchi.  Cardiovascular: Normal S1, S2. Regular rate and rhythm. No peripheral edema, cyanosis or pallor.  Gastrointestinal:  Soft, nondistended, nontender. No rebound or guarding.  Hypoactive bowel sounds. No appreciable masses or hepatomegaly. Rectal:  Not performed.  Msk:  Symmetrical without gross deformities. Without edema, no deformity or joint abnormality.  Neurologic:  Alert and  oriented x4;  grossly normal neurologically.  Skin:   Dry and intact  without significant lesions or rashes.  RELEVANT LABS AND IMAGING: CBC    Latest Ref Rng & Units 07/10/2023    3:47 PM 08/14/2022    3:16 PM 01/25/2022    4:29 PM  CBC  WBC 4.0 - 10.5 K/uL 8.3  6.6  12.4   Hemoglobin 12.0 - 15.0 g/dL 86.1  86.9  86.8   Hematocrit 36.0 - 46.0 % 42.0  40.3  39.9   Platelets 150.0 - 400.0 K/uL 343.0  343.0  420.0      CMP     Latest  Ref Rng & Units 07/10/2023    3:47 PM 08/14/2022    3:16 PM 01/25/2022    4:29 PM  CMP  Glucose 70 - 99 mg/dL 899  98  892   BUN 6 - 23 mg/dL 25  25  25    Creatinine 0.40 - 1.20 mg/dL 8.99  9.07  8.90   Sodium 135 - 145 mEq/L 135  133  135   Potassium 3.5 - 5.1 mEq/L 4.0  4.1  4.6   Chloride 96 - 112 mEq/L 98  96  98   CO2 19 - 32 mEq/L 30  29  30    Calcium 8.4 - 10.5 mg/dL 9.5  9.4  9.7   Total Protein 6.0 - 8.3 g/dL 7.2  7.1  7.3   Total Bilirubin 0.2 - 1.2 mg/dL 0.3  0.2  0.2   Alkaline Phos 39 - 117 U/L 49  58  73   AST 0 - 37 U/L 18  21  26    ALT 0 - 35 U/L 18  20  32      Lab Results  Component Value Date   TSH 1.418 07/08/2019   Lab Results  Component Value Date   LIPASE 26.0 07/10/2023     12/2019 echo-Left ventricular ejection fraction, by estimation, is 60 to 65%.   Imaging: 02/2022 CTAP IMPRESSION: 1. No acute process in the abdomen or pelvis. 2. Possible constipation. 3. Tiny hiatal hernia. 4. Coronary artery atherosclerosis. Aortic Atherosclerosis (ICD10-I70.0).  04/2017 colonoscopy, patient reports no repeat due to age - The entire examined colon is normal on direct and retroflexion views. - No polyps or cancers.   Assessment: Encounter Diagnoses  Name Primary?   Gastroesophageal reflux disease, unspecified whether esophagitis present Yes   Esophageal dysphagia    Altered bowel habits    Flatulence    Bloating    Family history of esophageal cancer     73 year old female patient with uncontrolled GERD who presents with complaints of esophageal dysphagia and intermittent nausea  despite being on daily PPI therapy.  Patient has never had EGD.  Patient also states her son passed away with esophageal cancer.  Will go ahead and proceed with upper GI endoscopy with possible dilatation with Dr. San in [facility]. For the gas, bloat, and altered bowel habits given her history of constipation and hypoactive bowel sounds today on physical exam we will go ahead and proceed with abdominal x-ray two-view to rule out obstipation.  Pending results Ibraheem Voris start her on over-the-counter MiraLAX  if no buildup on x-ray.  Also recommended low FODMAP diet.  Up-to-date on colonoscopy April 2019 which was normal.  Plan: -recommend low fodmap diet - order abdominal xray 2 view  -Recommend GERD diet, no late meals -continue omeprazole  40mg  po daily -offered switching ppi, patient would like to hold off for now -scheduled EGD with possible dilatation in [facility] with Dr. San. The risks and benefits of EGD with possible biopsies and esophageal dilation were discussed with the patient who agrees to proceed.  Thank you for the courtesy of this consult. Please call me with any questions or concerns.   Lindel Marcell, FNP-C [facility] 10/02/2023, 11:10 AM  Cc: Purcell Emil Schanz, *

## 2023-10-03 NOTE — Progress Notes (Signed)
 Agree with the assessment and plan as outlined by [facility], FNP-C.  Carlitos Bottino, DO, [facility]

## 2023-10-17 ENCOUNTER — Encounter: Payer: Self-pay | Admitting: *Deleted

## 2023-10-17 NOTE — Progress Notes (Signed)
 [name]                                          MRN: [id number]   10/17/2023   The [facility] Quality Team Specialist reviewed this patient medical record for the purposes of chart review for care gap closure. The following were reviewed: abstraction for care gap closure-controlling blood pressure.    [facility] Quality Team

## 2023-10-23 ENCOUNTER — Ambulatory Visit: Payer: Self-pay

## 2023-10-23 ENCOUNTER — Other Ambulatory Visit: Payer: Self-pay | Admitting: Emergency Medicine

## 2023-10-23 NOTE — Telephone Encounter (Signed)
 Please see recent NT encounter for additional details. Pt requesting to have 2 tubes refilled. Goes through cream quickly and wants to have multiple on hand.

## 2023-10-23 NOTE — Telephone Encounter (Signed)
 FYI Only or Action Required?: Action required by provider: medication refill request.  Patient was last seen in primary care on 07/10/2023 by Purcell Emil Schanz, MD.  Called Nurse Triage reporting Eczema.  Symptoms began several years ago.  Interventions attempted: Prescription medications: Hydrocortisone  2.5% cream.  Symptoms are: unchanged.  Triage Disposition: Call PCP When Office is Open (overriding See PCP Within 2 Weeks)  Patient/caregiver understands and will follow disposition?: Yes  Copied from CRM [id number]. Topic: Clinical - Red Word Triage >> Oct 23, 2023 11:40 AM Roselie BROCKS wrote: [facility] that prompted transfer to Nurse Triage: Patient has eczema, and is having a really bad flair up , bad pain and itching.   And can't get it to ease up  Reason for Disposition  Dry skin is chronic problem (recurrent or ongoing AND present > 4 weeks)    Needs refill on Hydrocortisone  cream refill, no need to see PCP in office  Answer Assessment - Initial Assessment Questions Pt with hx of eczema, managed by PCP long term. Calling to have hydrocortisone  cream refilled. Asking to have 2 tubes filled as she goes through it very quickly. Reports cream is effective. Routed request to clinic.  1. MAIN SYMPTOM: What is your main concern or symptom? (e.g., dry, flaky, bumpy or rough, cracked, itchy)     Eczema (chronic) 2. LOCATION: Where is the cracked or dry skin located?       Shins, legs 3. ONSET: When did the cracked or dry skin begin?     Many years 4. CAUSE: What do you think is causing the cracked or dry skin?     Eczema 5. PAIN: Is there any pain? If Yes, ask: How bad is the pain?  (e.g., Scale 1-10; mild, moderate, or severe)     Mild pain 7. OTHER SYMPTOMS: Do you have any other symptoms? (e.g.,  fever, itching, rash or redness)     Itching, redness, pain. Using hydrocortisone  cream daily which is effective. Just needing a refill on her cream because she goes  through it so quickly. Requesting 2 tubes.  Protocols used: Cracked or Dry Skin-A-AH

## 2023-10-26 ENCOUNTER — Other Ambulatory Visit: Payer: Self-pay | Admitting: Radiology

## 2023-10-26 MED ORDER — HYDROCORTISONE 2.5 % EX CREA: TOPICAL_CREAM | Freq: Two times a day (BID) | CUTANEOUS | 0 refills | Status: DC | PRN

## 2023-11-05 ENCOUNTER — Ambulatory Visit: Admitting: Gastroenterology

## 2023-11-05 ENCOUNTER — Encounter: Payer: Self-pay | Admitting: Gastroenterology

## 2023-11-05 VITALS — BP 147/69 | HR 58 | Temp 97.9°F | Resp 13 | Ht 66.0 in | Wt 182.0 lb

## 2023-11-05 DIAGNOSIS — K449 Diaphragmatic hernia without obstruction or gangrene: Secondary | ICD-10-CM | POA: Diagnosis not present

## 2023-11-05 DIAGNOSIS — R1310 Dysphagia, unspecified: Secondary | ICD-10-CM | POA: Diagnosis not present

## 2023-11-05 DIAGNOSIS — K2950 Unspecified chronic gastritis without bleeding: Secondary | ICD-10-CM | POA: Diagnosis not present

## 2023-11-05 DIAGNOSIS — K219 Gastro-esophageal reflux disease without esophagitis: Secondary | ICD-10-CM | POA: Diagnosis not present

## 2023-11-05 DIAGNOSIS — R1319 Other dysphagia: Secondary | ICD-10-CM

## 2023-11-05 DIAGNOSIS — M797 Fibromyalgia: Secondary | ICD-10-CM | POA: Diagnosis not present

## 2023-11-05 DIAGNOSIS — K2970 Gastritis, unspecified, without bleeding: Secondary | ICD-10-CM

## 2023-11-05 DIAGNOSIS — F419 Anxiety disorder, unspecified: Secondary | ICD-10-CM | POA: Diagnosis not present

## 2023-11-05 DIAGNOSIS — K3189 Other diseases of stomach and duodenum: Secondary | ICD-10-CM

## 2023-11-05 DIAGNOSIS — F32A Depression, unspecified: Secondary | ICD-10-CM | POA: Diagnosis not present

## 2023-11-05 MED ORDER — SODIUM CHLORIDE 0.9 % IV SOLN: 500.0000 mL | Freq: Once | INTRAVENOUS | Status: DC

## 2023-11-05 NOTE — Op Note (Signed)
 [facility] Patient Name: [name] Procedure Date: 11/05/2023 9:57 AM MRN: [id number] Endoscopist: Sandor Flatter , MD, [id number] Age: 73 Referring MD:  Date of Birth: 10-Aug-1950 Gender: Female Account #: [id number] Procedure:                Upper GI endoscopy Indications:              Dysphagia, Heartburn, Suspected esophageal reflux,                            Nausea Medicines:                Monitored Anesthesia Care Procedure:                Pre-Anesthesia Assessment:                           - Prior to the procedure, a History and Physical                            was performed, and patient medications and                            allergies were reviewed. The patient's tolerance of                            previous anesthesia was also reviewed. The risks                            and benefits of the procedure and the sedation                            options and risks were discussed with the patient.                            All questions were answered, and informed consent                            was obtained. Prior Anticoagulants: The patient has                            taken no anticoagulant or antiplatelet agents. ASA                            Grade Assessment: II - A patient with mild systemic                            disease. After reviewing the risks and benefits,                            the patient was deemed in satisfactory condition to                            undergo the procedure.  After obtaining informed consent, the endoscope was                            passed under direct vision. Throughout the                            procedure, the patient's blood pressure, pulse, and                            oxygen saturations were monitored continuously. The                            GIF [id number] #[id number] was introduced through the                            mouth, and advanced to the second part of  duodenum.                            The upper GI endoscopy was accomplished without                            difficulty. The patient tolerated the procedure                            well. Scope In: Scope Out: Findings:                 The examined esophagus was normal. A guidewire was                            placed and the scope was withdrawn. Dilation was                            performed with a Savary dilator with no resistance                            at 17 mm. The dilation site was examined following                            endoscope reinsertion and showed no bleeding,                            mucosal tear or perforation. Estimated blood loss:                            none.                           The Z-line was regular and was found 32 cm from the                            incisors.                           A 3 cm hiatal hernia was  present.                           Localized mild inflammation characterized by                            erythema was found in the gastric antrum. Biopsies                            were taken with a cold forceps for Helicobacter                            pylori testing. Estimated blood loss was minimal.                           The gastric fundus, gastric body and incisura were                            normal.                           The examined duodenum was normal. Complications:            No immediate complications. Estimated Blood Loss:     Estimated blood loss was minimal. Impression:               - Normal esophagus. Dilated with 17 mm Savary                            dilator.                           - Z-line regular, 32 cm from the incisors.                           - 3 cm hiatal hernia.                           - Gastritis. Biopsied.                           - Normal gastric fundus, gastric body and incisura.                           - Normal examined duodenum. Recommendation:           - Patient has a  contact number available for                            emergencies. The signs and symptoms of potential                            delayed complications were discussed with the                            patient. Return to normal activities tomorrow.  Written discharge instructions were provided to the                            patient.                           - Resume previous diet.                           - Continue present medications.                           - Await pathology results.                           - If continued symptoms, can trial empiric change                            in PPI therapy or referral for Esophageal Manometry                            with pH/Impedance testing.                           - Return to GI clinic PRN. Sandor Flatter, MD 11/05/2023 10:30:59 AM

## 2023-11-05 NOTE — Progress Notes (Signed)
 GASTROENTEROLOGY PROCEDURE H&P NOTE   Primary Care Physician: Purcell Emil Schanz, MD    Reason for Procedure:  GERD, early satiety, nausea, dysphagia  Plan:    EGD  Patient is appropriate for endoscopic procedure(s) in the ambulatory ([facility]) setting.  The nature of the procedure, as well as the risks, benefits, and alternatives were carefully and thoroughly reviewed with the patient. Ample time for discussion and questions allowed. The patient understood, was satisfied, and agreed to proceed.     HPI: [name] is a 73 y.o. female who presents for EGD for evaluation of GERD, nausea, dysphagia, early satiety.  Taking omeprazole  40 mg daily.  Otherwise no significant changes in clinical history since last OV on 10/02/2023.  Past Medical History:  Diagnosis Date   Aneurysm    brain   Anxiety    Arthritis    Cataract    Depression    Fibromyalgia    GERD (gastroesophageal reflux disease)    Hyperlipidemia    pt. thought she was placed on medication for cholesterol but does not know for sure.   Hypertension    Insomnia    Seizures (HCC)    [phone]    Past Surgical History:  Procedure Laterality Date   ABDOMINAL HYSTERECTOMY     BREAST CYST EXCISION Right    CEREBRAL ANEURYSM REPAIR     CEREBRAL ANEURYSM REPAIR     clipped    COLONOSCOPY     12+ years ago [place]   POLYPECTOMY      Prior to Admission medications   Medication Sig Start Date End Date Taking? Authorizing Provider  acetaminophen  (TYLENOL ) 500 MG tablet Take 1,000 mg by mouth every 6 (six) hours as needed for mild pain.   Yes [provider]  amLODipine  (NORVASC ) 5 MG tablet Take 1 tablet (5 mg total) by mouth daily. 04/12/23  Yes Sagardia, Emil Schanz, MD  baclofen  (LIORESAL ) 10 MG tablet TAKE 1 TABLET BY MOUTH EVERYDAY AT BEDTIME 09/26/23  Yes Sagardia, Emil Schanz, MD  cloNIDine  (CATAPRES ) 0.1 MG tablet TAKE 1 TABLET (0.1 MG TOTAL) BY MOUTH 2 (TWO) TIMES DAILY. KEEP SCHEDULED APPT FOR FUTURE  REILLS 09/21/23  Yes Sagardia, Emil Schanz, MD  fenofibrate  54 MG tablet TAKE 1 TABLET BY MOUTH EVERY DAY WITH FOOD 06/18/23  Yes Sagardia, Emil Schanz, MD  hydrochlorothiazide  (HYDRODIURIL ) 25 MG tablet TAKE 1 TABLET (25 MG TOTAL) BY MOUTH DAILY. 06/18/23  Yes Sagardia, Emil Schanz, MD  HYDROcodone  bit-homatropine [facility]) 5-1.5 MG/5ML syrup Take 5 mLs by mouth at bedtime as needed for cough. 02/13/23  Yes Sagardia, Emil Schanz, MD  hydrocortisone  2.5 % cream Apply topically 2 (two) times daily as needed. Use up to 14 days 10/26/23  Yes Sagardia, Emil Schanz, MD  irbesartan  (AVAPRO ) 300 MG tablet TAKE 1 TABLET BY MOUTH EVERY DAY 09/21/23  Yes Sagardia, Emil Schanz, MD  levETIRAcetam  (KEPPRA ) 500 MG tablet TAKE 1 TABLET BY MOUTH TWICE A DAY 06/25/23  Yes Sagardia, Miguel Jose, MD  metoprolol  tartrate (LOPRESSOR ) 25 MG tablet TAKE 2 TABLETS BY MOUTH 2 TIMES DAILY. 07/01/23  Yes Sagardia, Emil Schanz, MD  naproxen sodium (ALEVE) 220 MG tablet Take 440 mg by mouth 2 (two) times daily as needed (pain).   Yes [provider]  omeprazole  (PRILOSEC) 40 MG capsule TAKE 1 CAPSULE (40 MG TOTAL) BY MOUTH DAILY. 04/04/23  Yes Purcell Emil Schanz, MD  traZODone  (DESYREL ) 100 MG tablet TAKE 1 TABLET BY MOUTH EVERYDAY AT BEDTIME 07/17/23  Yes Sagardia,  Emil Schanz, MD    Current Outpatient Medications  Medication Sig Dispense Refill   acetaminophen  (TYLENOL ) 500 MG tablet Take 1,000 mg by mouth every 6 (six) hours as needed for mild pain.     amLODipine  (NORVASC ) 5 MG tablet Take 1 tablet (5 mg total) by mouth daily. 90 tablet 3   baclofen  (LIORESAL ) 10 MG tablet TAKE 1 TABLET BY MOUTH EVERYDAY AT BEDTIME 90 tablet 1   cloNIDine  (CATAPRES ) 0.1 MG tablet TAKE 1 TABLET (0.1 MG TOTAL) BY MOUTH 2 (TWO) TIMES DAILY. KEEP SCHEDULED APPT FOR FUTURE REILLS 180 tablet 2   fenofibrate  54 MG tablet TAKE 1 TABLET BY MOUTH EVERY DAY WITH FOOD 90 tablet 3   hydrochlorothiazide  (HYDRODIURIL ) 25 MG tablet TAKE 1 TABLET (25 MG  TOTAL) BY MOUTH DAILY. 90 tablet 3   HYDROcodone  bit-homatropine (HYCODAN) 5-1.5 MG/5ML syrup Take 5 mLs by mouth at bedtime as needed for cough. 120 mL 0   hydrocortisone  2.5 % cream Apply topically 2 (two) times daily as needed. Use up to 14 days 28 g 0   irbesartan  (AVAPRO ) 300 MG tablet TAKE 1 TABLET BY MOUTH EVERY DAY 90 tablet 3   levETIRAcetam  (KEPPRA ) 500 MG tablet TAKE 1 TABLET BY MOUTH TWICE A DAY 180 tablet 2   metoprolol  tartrate (LOPRESSOR ) 25 MG tablet TAKE 2 TABLETS BY MOUTH 2 TIMES DAILY. 360 tablet 1   naproxen sodium (ALEVE) 220 MG tablet Take 440 mg by mouth 2 (two) times daily as needed (pain).     omeprazole  (PRILOSEC) 40 MG capsule TAKE 1 CAPSULE (40 MG TOTAL) BY MOUTH DAILY. 90 capsule 2   traZODone  (DESYREL ) 100 MG tablet TAKE 1 TABLET BY MOUTH EVERYDAY AT BEDTIME 90 tablet 1   Current Facility-Administered Medications  Medication Dose Route Frequency Provider Last Rate Last Admin   0.9 %  sodium chloride  infusion  500 mL Intravenous Once Crystalina Stodghill V, DO        Allergies as of 11/05/2023 - Review Complete 11/05/2023  Allergen Reaction Noted   Egg protein-containing drug products Nausea And Vomiting 07/08/2019   Red dye #40 (allura red) Nausea And Vomiting 07/07/2019   Barium-containing compounds Nausea And Vomiting 04/05/2017    Family History  Problem Relation Age of Onset   Heart disease Mother    Heart disease Father    Breast cancer Maternal Aunt        11s   Colon cancer Paternal Grandfather    Cancer Brother    Arthritis Brother    Esophageal cancer Son    Colon polyps Neg Hx    Rectal cancer Neg Hx    Stomach cancer Neg Hx     Social History   Socioeconomic History   Marital status: Divorced    Spouse name: Not on file   Number of children: Not on file   Years of education: Not on file   Highest education level: Not on file  Occupational History   Occupation: retired  Tobacco Use   Smoking status: Former    Current packs/day: 0.00     Types: Cigarettes    Quit date: 1985    Years since quitting: 40.8   Smokeless tobacco: Never  Vaping Use   Vaping status: Never Used  Substance and Sexual Activity   Alcohol use: No   Drug use: No   Sexual activity: Not Currently  Other Topics Concern   Not on file  Social History Narrative   Not on file   Social  Drivers of Corporate investment banker Strain: Not on file  Food Insecurity: Not on file  Transportation Needs: Not on file  Physical Activity: Not on file  Stress: Not on file  Social Connections: Not on file  Intimate Partner Violence: Not on file    Physical Exam: Vital signs in last 24 hours: @BP  127/69   Pulse (!) 54   Temp 97.9 F (36.6 C)   Ht 5' 6 (1.676 m)   Wt 182 lb (82.6 kg)   SpO2 95%   BMI 29.38 kg/m  GEN: NAD EYE: Sclerae anicteric ENT: MMM CV: Non-tachycardic Pulm: CTA b/l GI: Soft, NT/ND NEURO:  Alert & Oriented x 3   Sandor Flatter, DO [facility]   11/05/2023 10:07 AM

## 2023-11-05 NOTE — Progress Notes (Signed)
 Sedate, gd SR, tolerated procedure well, VSS, report to RN

## 2023-11-05 NOTE — Patient Instructions (Signed)
 Handouts given: Hiatal Hernia, Gastritis Resume previous diet. Continue present medications.  Await pathology results.   YOU HAD AN ENDOSCOPIC PROCEDURE TODAY AT THE [facility]:   Refer to the procedure report that was given to you for any specific questions about what was found during the examination.  If the procedure report does not answer your questions, please call your gastroenterologist to clarify.  If you requested that your care partner not be given the details of your procedure findings, then the procedure report has been included in a sealed envelope for you to review at your convenience later.  YOU SHOULD EXPECT: Some feelings of bloating in the abdomen. Passage of more gas than usual.  Walking can help get rid of the air that was put into your GI tract during the procedure and reduce the bloating. If you had a lower endoscopy (such as a colonoscopy or flexible sigmoidoscopy) you may notice spotting of blood in your stool or on the toilet paper. If you underwent a bowel prep for your procedure, you may not have a normal bowel movement for a few days.  Please Note:  You might notice some irritation and congestion in your nose or some drainage.  This is from the oxygen used during your procedure.  There is no need for concern and it should clear up in a day or so.  SYMPTOMS TO REPORT IMMEDIATELY:  Following upper endoscopy (EGD)  Vomiting of blood or coffee ground material  New chest pain or pain under the shoulder blades  Painful or persistently difficult swallowing  New shortness of breath  Fever of 100F or higher  Black, tarry-looking stools  For urgent or emergent issues, a gastroenterologist can be reached at any hour by calling [phone]. Do not use MyChart messaging for urgent concerns.    DIET:  We do recommend a small meal at first, but then you may proceed to your regular diet.  Drink plenty of fluids but you should avoid alcoholic beverages for 24  hours.  ACTIVITY:  You should plan to take it easy for the rest of today and you should NOT DRIVE or use heavy machinery until tomorrow (because of the sedation medicines used during the test).    FOLLOW UP: Our staff will call the number listed on your records the next business day following your procedure.  We will call around 7:15- 8:00 am to check on you and address any questions or concerns that you may have regarding the information given to you following your procedure. If we do not reach you, we will leave a message.     If any biopsies were taken you will be contacted by phone or by letter within the next 1-3 weeks.  Please call us  at [phone] if you have not heard about the biopsies in 3 weeks.    SIGNATURES/CONFIDENTIALITY: You and/or your care partner have signed paperwork which will be entered into your electronic medical record.  These signatures attest to the fact that that the information above on your After Visit Summary has been reviewed and is understood.  Full responsibility of the confidentiality of this discharge information lies with you and/or your care-partner.

## 2023-11-05 NOTE — Progress Notes (Signed)
 Called to room to assist during endoscopic procedure.  Patient ID and intended procedure confirmed with present staff. Received instructions for my participation in the procedure from the performing physician.

## 2023-11-06 ENCOUNTER — Telehealth: Payer: Self-pay

## 2023-11-06 NOTE — Telephone Encounter (Signed)
  Follow up Call-     11/05/2023    9:32 AM  Call back number  Post procedure Call Back phone  # [phone]  Permission to leave phone message Yes     Patient questions:  Do you have a fever, pain , or abdominal swelling? No. Pain Score  0 *  Have you tolerated food without any problems? Yes.    Have you been able to return to your normal activities? Yes.    Do you have any questions about your discharge instructions: Diet   No. Medications  No. Follow up visit  No.  Do you have questions or concerns about your Care? No.  Actions: * If pain score is 4 or above: No action needed, pain <4.

## 2023-11-07 LAB — SURGICAL PATHOLOGY

## 2023-11-14 ENCOUNTER — Ambulatory Visit: Payer: Self-pay | Admitting: Gastroenterology

## 2023-11-19 ENCOUNTER — Ambulatory Visit

## 2023-11-19 DIAGNOSIS — Z23 Encounter for immunization: Secondary | ICD-10-CM | POA: Diagnosis not present

## 2023-11-19 NOTE — Progress Notes (Signed)
 Pt was given High dose Flu vaccine with no complications.

## 2023-11-28 DIAGNOSIS — R399 Unspecified symptoms and signs involving the genitourinary system: Secondary | ICD-10-CM | POA: Diagnosis not present

## 2023-11-28 DIAGNOSIS — N3020 Other chronic cystitis without hematuria: Secondary | ICD-10-CM | POA: Diagnosis not present

## 2023-12-05 DIAGNOSIS — N390 Urinary tract infection, site not specified: Secondary | ICD-10-CM | POA: Diagnosis not present

## 2023-12-05 DIAGNOSIS — R399 Unspecified symptoms and signs involving the genitourinary system: Secondary | ICD-10-CM | POA: Diagnosis not present

## 2023-12-17 ENCOUNTER — Other Ambulatory Visit: Payer: Self-pay | Admitting: Emergency Medicine

## 2023-12-24 ENCOUNTER — Ambulatory Visit: Admitting: Emergency Medicine

## 2023-12-24 ENCOUNTER — Encounter: Payer: Self-pay | Admitting: Emergency Medicine

## 2023-12-24 VITALS — BP 130/70 | HR 65 | Temp 97.8°F | Ht 66.0 in | Wt 183.0 lb

## 2023-12-24 DIAGNOSIS — G894 Chronic pain syndrome: Secondary | ICD-10-CM | POA: Insufficient documentation

## 2023-12-24 DIAGNOSIS — M545 Low back pain, unspecified: Secondary | ICD-10-CM | POA: Diagnosis not present

## 2023-12-24 DIAGNOSIS — K219 Gastro-esophageal reflux disease without esophagitis: Secondary | ICD-10-CM

## 2023-12-24 DIAGNOSIS — I10 Essential (primary) hypertension: Secondary | ICD-10-CM

## 2023-12-24 DIAGNOSIS — R569 Unspecified convulsions: Secondary | ICD-10-CM | POA: Diagnosis not present

## 2023-12-24 DIAGNOSIS — G8929 Other chronic pain: Secondary | ICD-10-CM

## 2023-12-24 MED ORDER — HYDROCORTISONE 2.5 % EX CREA: TOPICAL_CREAM | Freq: Two times a day (BID) | CUTANEOUS | 1 refills | Status: AC | PRN

## 2023-12-24 MED ORDER — OMEPRAZOLE 40 MG PO CPDR: 40.0000 mg | DELAYED_RELEASE_CAPSULE | Freq: Every day | ORAL | 0 refills | Status: AC

## 2023-12-24 MED ORDER — BACLOFEN 10 MG PO TABS: ORAL_TABLET | ORAL | 2 refills | Status: AC

## 2023-12-24 NOTE — Patient Instructions (Signed)
 Health Maintenance After Age 75 After age 27, you are at a higher risk for certain long-term diseases and infections as well as injuries from falls. Falls are a major cause of broken bones and head injuries in people who are older than age 73. Getting regular preventive care can help to keep you healthy and well. Preventive care includes getting regular testing and making lifestyle changes as recommended by your health care provider. Talk with your health care provider about: Which screenings and tests you should have. A screening is a test that checks for a disease when you have no symptoms. A diet and exercise plan that is right for you. What should I know about screenings and tests to prevent falls? Screening and testing are the best ways to find a health problem early. Early diagnosis and treatment give you the best chance of managing medical conditions that are common after age 90. Certain conditions and lifestyle choices may make you more likely to have a fall. Your health care provider may recommend: Regular vision checks. Poor vision and conditions such as cataracts can make you more likely to have a fall. If you wear glasses, make sure to get your prescription updated if your vision changes. Medicine review. Work with your health care provider to regularly review all of the medicines you are taking, including over-the-counter medicines. Ask your health care provider about any side effects that may make you more likely to have a fall. Tell your health care provider if any medicines that you take make you feel dizzy or sleepy. Strength and balance checks. Your health care provider may recommend certain tests to check your strength and balance while standing, walking, or changing positions. Foot health exam. Foot pain and numbness, as well as not wearing proper footwear, can make you more likely to have a fall. Screenings, including: Osteoporosis screening. Osteoporosis is a condition that causes  the bones to get weaker and break more easily. Blood pressure screening. Blood pressure changes and medicines to control blood pressure can make you feel dizzy. Depression screening. You may be more likely to have a fall if you have a fear of falling, feel depressed, or feel unable to do activities that you used to do. Alcohol  use screening. Using too much alcohol  can affect your balance and may make you more likely to have a fall. Follow these instructions at home: Lifestyle Do not drink alcohol  if: Your health care provider tells you not to drink. If you drink alcohol : Limit how much you have to: 0-1 drink a day for women. 0-2 drinks a day for men. Know how much alcohol  is in your drink. In the [place] one drink equals one 12 oz bottle of beer (355 mL), one 5 oz glass of wine (148 mL), or one 1 oz glass of hard liquor (44 mL). Do not use any products that contain nicotine or tobacco. These products include cigarettes, chewing tobacco, and vaping devices, such as e-cigarettes. If you need help quitting, ask your health care provider. Activity  Follow a regular exercise program to stay fit. This will help you maintain your balance. Ask your health care provider what types of exercise are appropriate for you. If you need a cane or walker, use it as recommended by your health care provider. Wear supportive shoes that have nonskid soles. Safety  Remove any tripping hazards, such as rugs, cords, and clutter. Install safety equipment such as grab bars in bathrooms and safety rails on stairs. Keep rooms and walkways  well-lit. General instructions Talk with your health care provider about your risks for falling. Tell your health care provider if: You fall. Be sure to tell your health care provider about all falls, even ones that seem minor. You feel dizzy, tiredness (fatigue), or off-balance. Take over-the-counter and prescription medicines only as told by your health care provider. These include  supplements. Eat a healthy diet and maintain a healthy weight. A healthy diet includes low-fat dairy products, low-fat (lean) meats, and fiber from whole grains, beans, and lots of fruits and vegetables. Stay current with your vaccines. Schedule regular health, dental, and eye exams. Summary Having a healthy lifestyle and getting preventive care can help to protect your health and wellness after age 15. Screening and testing are the best way to find a health problem early and help you avoid having a fall. Early diagnosis and treatment give you the best chance for managing medical conditions that are more common for people who are older than age 42. Falls are a major cause of broken bones and head injuries in people who are older than age 64. Take precautions to prevent a fall at home. Work with your health care provider to learn what changes you can make to improve your health and wellness and to prevent falls. This information is not intended to replace advice given to you by your health care provider. Make sure you discuss any questions you have with your health care provider. Document Revised: 05/31/2020 Document Reviewed: 05/31/2020 Elsevier Patient Education  2024 [employer].

## 2023-12-24 NOTE — Assessment & Plan Note (Signed)
 BP Readings from Last 3 Encounters:  12/24/23 130/70  11/05/23 (!) 147/69  10/02/23 124/70  Well-controlled on multiple medications Continue metoprolol  tartrate 25 mg twice a day, irbesartan  300 mg once a day, hydrochlorothiazide  25 mg daily, amlodipine  5 mg daily and clonidine  0.1 mg twice a day Cardiovascular risks associated with hypertension discussed Diet and nutrition discussed

## 2023-12-24 NOTE — Assessment & Plan Note (Signed)
Stable.  On chronic medications handled by neurologist. Takes Keppra 500 mg twice a day

## 2023-12-24 NOTE — Assessment & Plan Note (Signed)
 Active and affecting quality of life Pain management discussed Recommend evaluation at pain management clinic

## 2023-12-24 NOTE — Progress Notes (Signed)
 [name] 73 y.o.   Chief Complaint  Patient presents with   GI Problem    Pt states that she is having digestive issues    Back Pain    Pt would like another referral to have an MRI done for her back and she was not satisfied with the last referral location     Headache    Pt has been more frequent headaches     HISTORY OF PRESENT ILLNESS: This is a 73 y.o. female complaining of several things: 1.  Persistent digestive issues.  Recent upper endoscopy done 11/05/2023 showed hiatal hernia with mild inflammation of the stomach.  On omeprazole .  Denies diarrhea or constipation 2.  Earlier this year was seen by orthopedist for chronic low back pain.  MRI not done due to presence of metallic neurosurgical device in her brain from nonruptured aneurysm in the past 3.  Chronic back pain 4.  Chronic intermittent headaches No other complaints or medical concerns today.  HPI   Prior to Admission medications   Medication Sig Start Date End Date Taking? Authorizing Provider  acetaminophen  (TYLENOL ) 500 MG tablet Take 1,000 mg by mouth every 6 (six) hours as needed for mild pain.    [provider]  amLODipine  (NORVASC ) 5 MG tablet Take 1 tablet (5 mg total) by mouth daily. 04/12/23   Purcell Emil Schanz, MD  baclofen  (LIORESAL ) 10 MG tablet TAKE 1 TABLET BY MOUTH EVERYDAY AT BEDTIME 09/26/23   Luvina Poirier, Emil Schanz, MD  cloNIDine  (CATAPRES ) 0.1 MG tablet TAKE 1 TABLET (0.1 MG TOTAL) BY MOUTH 2 (TWO) TIMES DAILY. KEEP SCHEDULED APPT FOR FUTURE REILLS 09/21/23   [facility]cell Emil Schanz, MD  fenofibrate  54 MG tablet TAKE 1 TABLET BY MOUTH EVERY DAY WITH FOOD 06/18/23   Purcell Emil Schanz, MD  hydrochlorothiazide  (HYDRODIURIL ) 25 MG tablet TAKE 1 TABLET (25 MG TOTAL) BY MOUTH DAILY. 06/18/23   Purcell Emil Schanz, MD  HYDROcodone  bit-homatropine [employer]) 5-1.5 MG/5ML syrup Take 5 mLs by mouth at bedtime as needed for cough. 02/13/23   Purcell Emil Schanz, MD  hydrocortisone  2.5 % cream  Apply topically 2 (two) times daily as needed. Use up to 14 days 10/26/23   Purcell Emil Schanz, MD  irbesartan  (AVAPRO ) 300 MG tablet TAKE 1 TABLET BY MOUTH EVERY DAY 09/21/23   Kameran Mcneese Jose, MD  levETIRAcetam  (KEPPRA ) 500 MG tablet TAKE 1 TABLET BY MOUTH TWICE A DAY 06/25/23   [facility]cell Emil Schanz, MD  metoprolol  tartrate (LOPRESSOR ) 25 MG tablet TAKE 2 TABLETS BY MOUTH 2 TIMES DAILY. 12/17/23   Purcell Emil Schanz, MD  naproxen sodium (ALEVE) 220 MG tablet Take 440 mg by mouth 2 (two) times daily as needed (pain).    [provider]  omeprazole  (PRILOSEC) 40 MG capsule TAKE 1 CAPSULE (40 MG TOTAL) BY MOUTH DAILY. 04/04/23   Jaclynn Laumann Jose, MD  traZODone  (DESYREL ) 100 MG tablet TAKE 1 TABLET BY MOUTH EVERYDAY AT BEDTIME 07/17/23   Purcell Emil Schanz, MD    Allergies  Allergen Reactions   Egg Protein-Containing Drug Products Nausea And Vomiting   Red Dye #40 (Allura Red) Nausea And Vomiting   Barium-Containing Compounds Nausea And Vomiting    Patient Active Problem List   Diagnosis Date Noted   Chronic bilateral low back pain without sciatica 07/10/2023   Early satiety 07/10/2023   Breast lesion 04/05/2022   Abnormal kidney function 11/09/2021   Anemia 11/09/2021   Adjustment disorder with depressed mood 07/13/2019   Chronic diarrhea  07/07/2019   Vitamin D deficiency 02/15/2011   Fibromyalgia 02/14/2011   High risk medication use 02/14/2011   Hypertension 01/07/2011   GERD (gastroesophageal reflux disease) 12/30/2010   Anxiety 09/01/2010   Nonruptured cerebral aneurysm 07/01/2010   Ulnar neuropathy 06/03/2010   Depression 06/03/2010   Insomnia 06/03/2010   Seizure (HCC) 06/03/2010   Arthritis 06/03/2010    Past Medical History:  Diagnosis Date   Aneurysm    brain   Anxiety    Arthritis    Cataract    Depression    Fibromyalgia    GERD (gastroesophageal reflux disease)    Hyperlipidemia    pt. thought she was placed on medication for  cholesterol but does not know for sure.   Hypertension    Insomnia    Seizures (HCC)    [phone]    Past Surgical History:  Procedure Laterality Date   ABDOMINAL HYSTERECTOMY     BREAST CYST EXCISION Right    CEREBRAL ANEURYSM REPAIR     CEREBRAL ANEURYSM REPAIR     clipped    COLONOSCOPY     12+ years ago [place]   POLYPECTOMY      Social History   Socioeconomic History   Marital status: Divorced    Spouse name: Not on file   Number of children: Not on file   Years of education: Not on file   Highest education level: Not on file  Occupational History   Occupation: retired  Tobacco Use   Smoking status: Former    Current packs/day: 0.00    Types: Cigarettes    Quit date: 1985    Years since quitting: 40.9   Smokeless tobacco: Never  Vaping Use   Vaping status: Never Used  Substance and Sexual Activity   Alcohol use: No   Drug use: No   Sexual activity: Not Currently  Other Topics Concern   Not on file  Social History Narrative   Not on file   Social Drivers of Health   Financial Resource Strain: Not on file  Food Insecurity: Not on file  Transportation Needs: Not on file  Physical Activity: Not on file  Stress: Not on file  Social Connections: Not on file  Intimate Partner Violence: Not on file    Family History  Problem Relation Age of Onset   Heart disease Mother    Heart disease Father    Breast cancer Maternal Aunt        40s   Colon cancer Paternal Grandfather    Cancer Brother    Arthritis Brother    Esophageal cancer Son    Colon polyps Neg Hx    Rectal cancer Neg Hx    Stomach cancer Neg Hx      Review of Systems  Constitutional: Negative.  Negative for chills and fever.  HENT: Negative.  Negative for congestion and sore throat.   Respiratory: Negative.  Negative for cough and shortness of breath.   Cardiovascular: Negative.  Negative for chest pain and palpitations.  Gastrointestinal:  Positive for heartburn.  Genitourinary:  Negative.  Negative for dysuria and hematuria.  Musculoskeletal:  Positive for back pain.  Skin: Negative.  Negative for rash.  Neurological:  Positive for headaches. Negative for dizziness.  All other systems reviewed and are negative.   Vitals:   12/24/23 1424  BP: (!) 158/84  Pulse: 65  Temp: 97.8 F (36.6 C)  SpO2: 96%    Physical Exam Vitals reviewed.  Constitutional:  Appearance: Normal appearance.  HENT:     Head: Normocephalic.  Eyes:     Extraocular Movements: Extraocular movements intact.  Cardiovascular:     Rate and Rhythm: Normal rate.  Pulmonary:     Effort: Pulmonary effort is normal.  Skin:    General: Skin is warm and dry.     Capillary Refill: Capillary refill takes less than 2 seconds.  Neurological:     General: No focal deficit present.     Mental Status: She is alert and oriented to person, place, and time.  Psychiatric:        Mood and Affect: Mood normal.        Behavior: Behavior normal.      ASSESSMENT & PLAN: A total of 44 minutes was spent with the patient and counseling/coordination of care regarding preparing for this visit, review of most recent office visit notes, review of multiple chronic medical conditions and their management, review of all medications, review of most recent bloodwork results, review of health maintenance items, education on nutrition, prognosis, documentation, and need for follow up.   Problem List Items Addressed This Visit       Cardiovascular and Mediastinum   Hypertension (Chronic)   BP Readings from Last 3 Encounters:  12/24/23 130/70  11/05/23 (!) 147/69  10/02/23 124/70  Well-controlled on multiple medications Continue metoprolol  tartrate 25 mg twice a day, irbesartan  300 mg once a day, hydrochlorothiazide  25 mg daily, amlodipine  5 mg daily and clonidine  0.1 mg twice a day Cardiovascular risks associated with hypertension discussed Diet and nutrition discussed          Digestive   GERD  (gastroesophageal reflux disease) - Primary   On and off symptoms Hiatal hernia seen on recent upper endoscopy Report reviewed with patient Mild gastritis Presently taking omeprazole  40 mg daily Diet and nutrition discussed Clinically stable.  No red flag signs or symptoms      Relevant Medications   omeprazole  (PRILOSEC) 40 MG capsule     Other   Seizure (HCC)   Stable.  On chronic medications handled by neurologist. Takes Keppra  500 mg twice a day      Chronic bilateral low back pain without sciatica   Creating chronic low back pain and affecting quality of life Recent orthopedic evaluation office visit notes reviewed      Relevant Medications   baclofen  (LIORESAL ) 10 MG tablet   Chronic pain syndrome   Active and affecting quality of life Pain management discussed Recommend evaluation at pain management clinic      Relevant Medications   baclofen  (LIORESAL ) 10 MG tablet   Other Relevant Orders   Ambulatory referral to Pain Clinic   Patient Instructions  Health Maintenance After Age 13 After age 47, you are at a higher risk for certain long-term diseases and infections as well as injuries from falls. Falls are a major cause of broken bones and head injuries in people who are older than age 2. Getting regular preventive care can help to keep you healthy and well. Preventive care includes getting regular testing and making lifestyle changes as recommended by your health care provider. Talk with your health care provider about: Which screenings and tests you should have. A screening is a test that checks for a disease when you have no symptoms. A diet and exercise plan that is right for you. What should I know about screenings and tests to prevent falls? Screening and testing are the best ways to find a health  problem early. Early diagnosis and treatment give you the best chance of managing medical conditions that are common after age 71. Certain conditions and lifestyle  choices may make you more likely to have a fall. Your health care provider may recommend: Regular vision checks. Poor vision and conditions such as cataracts can make you more likely to have a fall. If you wear glasses, make sure to get your prescription updated if your vision changes. Medicine review. Work with your health care provider to regularly review all of the medicines you are taking, including over-the-counter medicines. Ask your health care provider about any side effects that may make you more likely to have a fall. Tell your health care provider if any medicines that you take make you feel dizzy or sleepy. Strength and balance checks. Your health care provider may recommend certain tests to check your strength and balance while standing, walking, or changing positions. Foot health exam. Foot pain and numbness, as well as not wearing proper footwear, can make you more likely to have a fall. Screenings, including: Osteoporosis screening. Osteoporosis is a condition that causes the bones to get weaker and break more easily. Blood pressure screening. Blood pressure changes and medicines to control blood pressure can make you feel dizzy. Depression screening. You may be more likely to have a fall if you have a fear of falling, feel depressed, or feel unable to do activities that you used to do. Alcohol use screening. Using too much alcohol can affect your balance and may make you more likely to have a fall. Follow these instructions at home: Lifestyle Do not drink alcohol if: Your health care provider tells you not to drink. If you drink alcohol: Limit how much you have to: 0-1 drink a day for women. 0-2 drinks a day for men. Know how much alcohol is in your drink. In the [place] one drink equals one 12 oz bottle of beer (355 mL), one 5 oz glass of wine (148 mL), or one 1 oz glass of hard liquor (44 mL). Do not use any products that contain nicotine or tobacco. These products include  cigarettes, chewing tobacco, and vaping devices, such as e-cigarettes. If you need help quitting, ask your health care provider. Activity  Follow a regular exercise program to stay fit. This will help you maintain your balance. Ask your health care provider what types of exercise are appropriate for you. If you need a cane or walker, use it as recommended by your health care provider. Wear supportive shoes that have nonskid soles. Safety  Remove any tripping hazards, such as rugs, cords, and clutter. Install safety equipment such as grab bars in bathrooms and safety rails on stairs. Keep rooms and walkways well-lit. General instructions Talk with your health care provider about your risks for falling. Tell your health care provider if: You fall. Be sure to tell your health care provider about all falls, even ones that seem minor. You feel dizzy, tiredness (fatigue), or off-balance. Take over-the-counter and prescription medicines only as told by your health care provider. These include supplements. Eat a healthy diet and maintain a healthy weight. A healthy diet includes low-fat dairy products, low-fat (lean) meats, and fiber from whole grains, beans, and lots of fruits and vegetables. Stay current with your vaccines. Schedule regular health, dental, and eye exams. Summary Having a healthy lifestyle and getting preventive care can help to protect your health and wellness after age 52. Screening and testing are the best way to find a  health problem early and help you avoid having a fall. Early diagnosis and treatment give you the best chance for managing medical conditions that are more common for people who are older than age 1. Falls are a major cause of broken bones and head injuries in people who are older than age 62. Take precautions to prevent a fall at home. Work with your health care provider to learn what changes you can make to improve your health and wellness and to prevent  falls. This information is not intended to replace advice given to you by your health care provider. Make sure you discuss any questions you have with your health care provider. Document Revised: 05/31/2020 Document Reviewed: 05/31/2020 Elsevier Patient Education  2024 [employer]     Emil Schaumann, MD [facility]

## 2023-12-24 NOTE — Assessment & Plan Note (Signed)
 Creating chronic low back pain and affecting quality of life Recent orthopedic evaluation office visit notes reviewed

## 2023-12-24 NOTE — Assessment & Plan Note (Signed)
 On and off symptoms Hiatal hernia seen on recent upper endoscopy Report reviewed with patient Mild gastritis Presently taking omeprazole  40 mg daily Diet and nutrition discussed Clinically stable.  No red flag signs or symptoms

## 2024-01-02 ENCOUNTER — Other Ambulatory Visit: Payer: Self-pay | Admitting: Emergency Medicine

## 2024-02-27 ENCOUNTER — Encounter: Payer: Self-pay | Admitting: *Deleted

## 2024-02-27 NOTE — Progress Notes (Signed)
 [name]                                          MRN: [id number]   02/27/2024   The [facility] Quality Team Specialist reviewed this patient medical record for the purposes of chart review for care gap closure. The following were reviewed: abstraction for care gap closure-controlling blood pressure.    [facility] Quality Team
